# Patient Record
Sex: Male | Born: 1965
Health system: Southern US, Community
[De-identification: ages and names within clinical notes are randomized; demographics above are authoritative.]

## PROBLEM LIST (undated history)

## (undated) DIAGNOSIS — E785 Hyperlipidemia, unspecified: Secondary | ICD-10-CM

## (undated) DIAGNOSIS — M1990 Unspecified osteoarthritis, unspecified site: Secondary | ICD-10-CM

## (undated) DIAGNOSIS — I10 Essential (primary) hypertension: Secondary | ICD-10-CM

## (undated) DIAGNOSIS — K219 Gastro-esophageal reflux disease without esophagitis: Secondary | ICD-10-CM

## (undated) DIAGNOSIS — D1800 Hemangioma unspecified site: Secondary | ICD-10-CM

## (undated) DIAGNOSIS — Z8619 Personal history of other infectious and parasitic diseases: Secondary | ICD-10-CM

## (undated) HISTORY — PX: TONSILLECTOMY: SUR1361

## (undated) HISTORY — PX: COLONOSCOPY: SHX174

## (undated) HISTORY — PX: KNEE SURGERY: SHX244

## (undated) HISTORY — DX: Hyperlipidemia, unspecified: E78.5

## (undated) HISTORY — PX: MOUTH SURGERY: SHX715

## (undated) HISTORY — DX: Gastro-esophageal reflux disease without esophagitis: K21.9

## (undated) HISTORY — DX: Personal history of other infectious and parasitic diseases: Z86.19

## (undated) HISTORY — DX: Hemangioma unspecified site: D18.00

## (undated) HISTORY — PX: EPIDURAL BLOCK INJECTION: SHX1516

## (undated) HISTORY — DX: Unspecified osteoarthritis, unspecified site: M19.90

---

## 2000-09-04 ENCOUNTER — Ambulatory Visit (HOSPITAL_BASED_OUTPATIENT_CLINIC_OR_DEPARTMENT_OTHER): Admission: RE | Admit: 2000-09-04 | Discharge: 2000-09-04 | Payer: Self-pay | Admitting: Orthopaedic Surgery

## 2004-08-01 ENCOUNTER — Ambulatory Visit: Payer: Self-pay | Admitting: Internal Medicine

## 2004-09-13 ENCOUNTER — Ambulatory Visit: Payer: Self-pay | Admitting: Family Medicine

## 2005-04-25 ENCOUNTER — Ambulatory Visit: Payer: Self-pay | Admitting: Internal Medicine

## 2006-05-07 ENCOUNTER — Ambulatory Visit: Payer: Self-pay | Admitting: Internal Medicine

## 2006-05-23 ENCOUNTER — Ambulatory Visit: Payer: Self-pay | Admitting: Internal Medicine

## 2006-06-28 ENCOUNTER — Ambulatory Visit: Payer: Self-pay | Admitting: Internal Medicine

## 2006-06-28 LAB — CONVERTED CEMR LAB
ALT: 24 units/L (ref 0–40)
AST: 16 units/L (ref 0–37)
Chol/HDL Ratio, serum: 4.5
Cholesterol: 132 mg/dL (ref 0–200)
HDL: 29.5 mg/dL — ABNORMAL LOW (ref >39.0)
LDL Cholesterol: 81 mg/dL (ref 0–99)
Triglyceride fasting, serum: 109 mg/dL (ref 0–149)
VLDL: 22 mg/dL (ref 0–40)

## 2006-07-31 ENCOUNTER — Ambulatory Visit: Payer: Self-pay | Admitting: Internal Medicine

## 2006-08-02 ENCOUNTER — Ambulatory Visit: Payer: Self-pay | Admitting: Internal Medicine

## 2006-08-02 LAB — CONVERTED CEMR LAB
Rheumatoid Fact: <20 intl units/mL — ABNORMAL LOW (ref 0.0–20.0)
Sed Rate: 6 mm/hr (ref 0–20)
Total CK: 83 units/L (ref 7–195)
Uric Acid, Serum: 6.1 mg/dL (ref 2.4–7.0)

## 2006-08-27 ENCOUNTER — Ambulatory Visit: Payer: Self-pay | Admitting: Internal Medicine

## 2006-09-07 ENCOUNTER — Ambulatory Visit (HOSPITAL_COMMUNITY): Admission: RE | Admit: 2006-09-07 | Discharge: 2006-09-07 | Payer: Self-pay | Admitting: Orthopaedic Surgery

## 2006-09-07 ENCOUNTER — Encounter: Payer: Self-pay | Admitting: Vascular Surgery

## 2006-09-17 ENCOUNTER — Ambulatory Visit: Payer: Self-pay | Admitting: Internal Medicine

## 2006-11-28 ENCOUNTER — Ambulatory Visit: Payer: Self-pay | Admitting: Internal Medicine

## 2006-11-28 LAB — CONVERTED CEMR LAB
ALT: 21 units/L (ref 0–40)
AST: 16 units/L (ref 0–37)
Cholesterol: 115 mg/dL (ref 0–200)
HDL: 37.2 mg/dL — ABNORMAL LOW (ref >39.0)
LDL Cholesterol: 64 mg/dL (ref 0–99)
Total CHOL/HDL Ratio: 3.1
Triglycerides: 69 mg/dL (ref 0–149)
VLDL: 14 mg/dL (ref 0–40)

## 2006-12-11 ENCOUNTER — Encounter (INDEPENDENT_AMBULATORY_CARE_PROVIDER_SITE_OTHER): Payer: Self-pay | Admitting: *Deleted

## 2006-12-11 ENCOUNTER — Ambulatory Visit (HOSPITAL_BASED_OUTPATIENT_CLINIC_OR_DEPARTMENT_OTHER): Admission: RE | Admit: 2006-12-11 | Discharge: 2006-12-11 | Payer: Self-pay | Admitting: Orthopaedic Surgery

## 2007-01-14 ENCOUNTER — Ambulatory Visit: Payer: Self-pay | Admitting: Internal Medicine

## 2007-01-21 ENCOUNTER — Ambulatory Visit: Payer: Self-pay | Admitting: Internal Medicine

## 2007-01-21 DIAGNOSIS — M129 Arthropathy, unspecified: Secondary | ICD-10-CM | POA: Insufficient documentation

## 2007-01-22 LAB — CONVERTED CEMR LAB
Albumin: 4.2 g/dL (ref 3.5–5.2)
Basophils Absolute: 0 10*3/uL (ref 0.0–0.1)
Hemoglobin: 15 g/dL (ref 13.0–17.0)
Lymphocytes Relative: 32.2 % (ref 12.0–46.0)
MCHC: 33.5 g/dL (ref 30.0–36.0)
MCV: 85.6 fL (ref 78.0–100.0)
Monocytes Absolute: 0.6 10*3/uL (ref 0.2–0.7)
Monocytes Relative: 8 % (ref 3.0–11.0)
Neutro Abs: 4.2 10*3/uL (ref 1.4–7.7)
Rhuematoid fact SerPl-aCnc: <20 intl units/mL — ABNORMAL LOW (ref 0.0–20.0)
Total Bilirubin: 1 mg/dL (ref 0.3–1.2)
Total CK: 43 units/L (ref 7–195)
Uric Acid, Serum: 6.6 mg/dL (ref 2.4–7.0)

## 2007-01-23 ENCOUNTER — Telehealth: Payer: Self-pay | Admitting: Internal Medicine

## 2007-01-24 ENCOUNTER — Encounter: Payer: Self-pay | Admitting: Internal Medicine

## 2007-01-25 ENCOUNTER — Telehealth: Payer: Self-pay | Admitting: Internal Medicine

## 2007-01-26 LAB — CONVERTED CEMR LAB
Anti Nuclear Antibody(ANA): NEGATIVE
Hepatitis B Surface Ag: NEGATIVE

## 2007-01-30 ENCOUNTER — Encounter: Payer: Self-pay | Admitting: Internal Medicine

## 2007-03-14 ENCOUNTER — Ambulatory Visit: Payer: Self-pay | Admitting: Internal Medicine

## 2007-03-15 ENCOUNTER — Encounter: Payer: Self-pay | Admitting: Internal Medicine

## 2007-03-21 ENCOUNTER — Encounter: Admission: RE | Admit: 2007-03-21 | Discharge: 2007-03-21 | Payer: Self-pay | Admitting: Internal Medicine

## 2007-03-26 ENCOUNTER — Telehealth: Payer: Self-pay | Admitting: Internal Medicine

## 2007-03-28 ENCOUNTER — Telehealth: Payer: Self-pay | Admitting: Internal Medicine

## 2007-03-29 ENCOUNTER — Encounter (INDEPENDENT_AMBULATORY_CARE_PROVIDER_SITE_OTHER): Payer: Self-pay | Admitting: *Deleted

## 2007-05-23 ENCOUNTER — Ambulatory Visit: Payer: Self-pay | Admitting: Internal Medicine

## 2007-05-28 ENCOUNTER — Encounter: Payer: Self-pay | Admitting: Internal Medicine

## 2007-06-26 ENCOUNTER — Ambulatory Visit: Payer: Self-pay | Admitting: Internal Medicine

## 2007-07-25 ENCOUNTER — Encounter: Payer: Self-pay | Admitting: Internal Medicine

## 2007-08-05 ENCOUNTER — Telehealth (INDEPENDENT_AMBULATORY_CARE_PROVIDER_SITE_OTHER): Payer: Self-pay | Admitting: *Deleted

## 2007-10-04 ENCOUNTER — Ambulatory Visit: Payer: Self-pay | Admitting: Internal Medicine

## 2007-10-04 DIAGNOSIS — E785 Hyperlipidemia, unspecified: Secondary | ICD-10-CM | POA: Insufficient documentation

## 2007-12-20 ENCOUNTER — Encounter: Payer: Self-pay | Admitting: Internal Medicine

## 2008-04-24 ENCOUNTER — Encounter: Payer: Self-pay | Admitting: Internal Medicine

## 2008-05-29 ENCOUNTER — Ambulatory Visit: Payer: Self-pay | Admitting: Internal Medicine

## 2008-05-29 HISTORY — DX: Other disorders of bilirubin metabolism: E80.6

## 2008-06-01 ENCOUNTER — Telehealth (INDEPENDENT_AMBULATORY_CARE_PROVIDER_SITE_OTHER): Payer: Self-pay | Admitting: *Deleted

## 2008-06-05 ENCOUNTER — Encounter (INDEPENDENT_AMBULATORY_CARE_PROVIDER_SITE_OTHER): Payer: Self-pay | Admitting: *Deleted

## 2008-06-05 ENCOUNTER — Ambulatory Visit: Payer: Self-pay | Admitting: Internal Medicine

## 2008-06-05 LAB — CONVERTED CEMR LAB
OCCULT 2: NEGATIVE
OCCULT 3: NEGATIVE

## 2008-06-08 ENCOUNTER — Ambulatory Visit: Payer: Self-pay | Admitting: Internal Medicine

## 2008-06-09 ENCOUNTER — Encounter (INDEPENDENT_AMBULATORY_CARE_PROVIDER_SITE_OTHER): Payer: Self-pay | Admitting: *Deleted

## 2008-06-09 LAB — CONVERTED CEMR LAB
Albumin: 4.1 g/dL (ref 3.5–5.2)
Total Protein: 6.6 g/dL (ref 6.0–8.3)

## 2008-09-25 ENCOUNTER — Encounter: Payer: Self-pay | Admitting: Internal Medicine

## 2009-02-26 ENCOUNTER — Encounter: Payer: Self-pay | Admitting: Internal Medicine

## 2009-06-14 ENCOUNTER — Ambulatory Visit: Payer: Self-pay | Admitting: Internal Medicine

## 2009-06-18 ENCOUNTER — Encounter (INDEPENDENT_AMBULATORY_CARE_PROVIDER_SITE_OTHER): Payer: Self-pay | Admitting: *Deleted

## 2009-07-23 ENCOUNTER — Encounter: Payer: Self-pay | Admitting: Internal Medicine

## 2009-08-04 ENCOUNTER — Telehealth: Payer: Self-pay | Admitting: Internal Medicine

## 2009-08-10 ENCOUNTER — Ambulatory Visit: Payer: Self-pay | Admitting: Internal Medicine

## 2009-08-10 DIAGNOSIS — R519 Headache, unspecified: Secondary | ICD-10-CM | POA: Insufficient documentation

## 2009-08-10 DIAGNOSIS — R51 Headache: Secondary | ICD-10-CM | POA: Insufficient documentation

## 2009-08-25 ENCOUNTER — Telehealth (INDEPENDENT_AMBULATORY_CARE_PROVIDER_SITE_OTHER): Payer: Self-pay | Admitting: *Deleted

## 2009-09-07 ENCOUNTER — Ambulatory Visit: Payer: Self-pay | Admitting: Internal Medicine

## 2009-09-07 DIAGNOSIS — T887XXA Unspecified adverse effect of drug or medicament, initial encounter: Secondary | ICD-10-CM | POA: Insufficient documentation

## 2009-09-10 LAB — CONVERTED CEMR LAB
AST: 26 units/L (ref 0–37)
Albumin: 4.3 g/dL (ref 3.5–5.2)
Alkaline Phosphatase: 46 units/L (ref 39–117)
BUN: 11 mg/dL (ref 6–23)
Basophils Absolute: 0 10*3/uL (ref 0.0–0.1)
Basophils Relative: 0.7 % (ref 0.0–3.0)
Bilirubin, Direct: 0.1 mg/dL (ref 0.0–0.3)
CO2: 29 meq/L (ref 19–32)
Calcium: 9.1 mg/dL (ref 8.4–10.5)
Chloride: 110 meq/L (ref 96–112)
Creatinine, Ser: 1 mg/dL (ref 0.4–1.5)
Eosinophils Absolute: 0.1 10*3/uL (ref 0.0–0.7)
Glucose, Bld: 93 mg/dL (ref 70–99)
LDL Cholesterol: 84 mg/dL (ref 0–99)
Lymphocytes Relative: 36.4 % (ref 12.0–46.0)
MCHC: 33.8 g/dL (ref 30.0–36.0)
MCV: 98.3 fL (ref 78.0–100.0)
Monocytes Absolute: 0.3 10*3/uL (ref 0.1–1.0)
Neutrophils Relative %: 54.8 % (ref 43.0–77.0)
Platelets: 175 10*3/uL (ref 150.0–400.0)
RBC: 4.57 M/uL (ref 4.22–5.81)
RDW: 14 % (ref 11.5–14.6)
Total Bilirubin: 1 mg/dL (ref 0.3–1.2)
Total CHOL/HDL Ratio: 4
Triglycerides: 101 mg/dL (ref 0.0–149.0)

## 2009-09-21 ENCOUNTER — Telehealth (INDEPENDENT_AMBULATORY_CARE_PROVIDER_SITE_OTHER): Payer: Self-pay | Admitting: *Deleted

## 2009-09-24 ENCOUNTER — Encounter: Payer: Self-pay | Admitting: Internal Medicine

## 2009-10-13 ENCOUNTER — Encounter: Payer: Self-pay | Admitting: Internal Medicine

## 2009-10-14 ENCOUNTER — Telehealth: Payer: Self-pay | Admitting: Internal Medicine

## 2009-12-16 ENCOUNTER — Encounter: Payer: Self-pay | Admitting: Internal Medicine

## 2010-01-05 ENCOUNTER — Telehealth (INDEPENDENT_AMBULATORY_CARE_PROVIDER_SITE_OTHER): Payer: Self-pay | Admitting: *Deleted

## 2010-02-09 ENCOUNTER — Ambulatory Visit: Payer: Self-pay | Admitting: Family

## 2010-02-09 ENCOUNTER — Ambulatory Visit: Payer: Self-pay | Admitting: Interventional Radiology

## 2010-02-09 ENCOUNTER — Encounter: Payer: Self-pay | Admitting: Internal Medicine

## 2010-02-09 ENCOUNTER — Telehealth: Payer: Self-pay | Admitting: Family

## 2010-02-09 ENCOUNTER — Ambulatory Visit (HOSPITAL_BASED_OUTPATIENT_CLINIC_OR_DEPARTMENT_OTHER): Admission: RE | Admit: 2010-02-09 | Discharge: 2010-02-09 | Payer: Self-pay | Admitting: Internal Medicine

## 2010-02-09 DIAGNOSIS — M069 Rheumatoid arthritis, unspecified: Secondary | ICD-10-CM

## 2010-02-09 HISTORY — DX: Rheumatoid arthritis, unspecified: M06.9

## 2010-02-11 LAB — CONVERTED CEMR LAB
ALT: 27 units/L (ref 0–53)
AST: 19 units/L (ref 0–37)
Basophils Absolute: 0 10*3/uL (ref 0.0–0.1)
Basophils Relative: 1 % (ref 0–1)
Bilirubin, Direct: 0.2 mg/dL (ref 0.0–0.3)
Cholesterol: 158 mg/dL (ref 0–200)
Hemoglobin: 16.2 g/dL (ref 13.0–17.0)
Indirect Bilirubin: 0.8 mg/dL (ref 0.0–0.9)
Lymphocytes Relative: 33 % (ref 12–46)
MCHC: 34.2 g/dL (ref 30.0–36.0)
Monocytes Relative: 9 % (ref 3–12)
Neutro Abs: 3.7 10*3/uL (ref 1.7–7.7)
Neutrophils Relative %: 56 % (ref 43–77)
RBC: 4.93 M/uL (ref 4.22–5.81)
RDW: 14.8 % (ref 11.5–15.5)
Total Protein: 6.2 g/dL (ref 6.0–8.3)
Triglycerides: 131 mg/dL (ref <150)

## 2010-02-23 ENCOUNTER — Ambulatory Visit: Payer: Self-pay | Admitting: Family

## 2010-04-14 ENCOUNTER — Ambulatory Visit: Payer: Self-pay | Admitting: Internal Medicine

## 2010-08-18 ENCOUNTER — Encounter: Payer: Self-pay | Admitting: Internal Medicine

## 2010-08-18 ENCOUNTER — Other Ambulatory Visit: Payer: Self-pay | Admitting: Internal Medicine

## 2010-08-18 ENCOUNTER — Ambulatory Visit
Admission: RE | Admit: 2010-08-18 | Discharge: 2010-08-18 | Payer: Self-pay | Source: Home / Self Care | Attending: Internal Medicine | Admitting: Internal Medicine

## 2010-08-18 DIAGNOSIS — I10 Essential (primary) hypertension: Secondary | ICD-10-CM | POA: Insufficient documentation

## 2010-08-18 HISTORY — DX: Essential (primary) hypertension: I10

## 2010-08-18 LAB — CBC WITH DIFFERENTIAL/PLATELET
Basophils Absolute: 0 10*3/uL (ref 0.0–0.1)
Basophils Relative: 0.6 % (ref 0.0–3.0)
Eosinophils Absolute: 0.2 10*3/uL (ref 0.0–0.7)
Eosinophils Relative: 2.8 % (ref 0.0–5.0)
HCT: 49 % (ref 39.0–52.0)
Hemoglobin: 16.7 g/dL (ref 13.0–17.0)
Lymphocytes Relative: 35.8 % (ref 12.0–46.0)
Lymphs Abs: 2.3 10*3/uL (ref 0.7–4.0)
MCHC: 34.1 g/dL (ref 30.0–36.0)
MCV: 97.4 fl (ref 78.0–100.0)
Monocytes Absolute: 0.5 10*3/uL (ref 0.1–1.0)
Monocytes Relative: 7.7 % (ref 3.0–12.0)
Neutro Abs: 3.4 10*3/uL (ref 1.4–7.7)
Neutrophils Relative %: 53.1 % (ref 43.0–77.0)
Platelets: 205 10*3/uL (ref 150.0–400.0)
RBC: 5.03 Mil/uL (ref 4.22–5.81)
RDW: 15.2 % — ABNORMAL HIGH (ref 11.5–14.6)
WBC: 6.4 10*3/uL (ref 4.5–10.5)

## 2010-08-19 LAB — TSH: TSH: 1.08 u[IU]/mL (ref 0.35–5.50)

## 2010-08-19 LAB — HEPATIC FUNCTION PANEL
ALT: 42 U/L (ref 0–53)
AST: 28 U/L (ref 0–37)
Albumin: 4 g/dL (ref 3.5–5.2)
Alkaline Phosphatase: 46 U/L (ref 39–117)
Bilirubin, Direct: 0.2 mg/dL (ref 0.0–0.3)
Total Bilirubin: 1.2 mg/dL (ref 0.3–1.2)
Total Protein: 6.4 g/dL (ref 6.0–8.3)

## 2010-08-19 LAB — BASIC METABOLIC PANEL
BUN: 14 mg/dL (ref 6–23)
CO2: 27 mEq/L (ref 19–32)
Calcium: 9 mg/dL (ref 8.4–10.5)
Chloride: 106 mEq/L (ref 96–112)
Creatinine, Ser: 0.8 mg/dL (ref 0.4–1.5)
GFR: 107.38 mL/min (ref >60.00)
Glucose, Bld: 77 mg/dL (ref 70–99)
Potassium: 4.2 mEq/L (ref 3.5–5.1)
Sodium: 142 mEq/L (ref 135–145)

## 2010-09-11 LAB — CONVERTED CEMR LAB
ALT: 48 units/L (ref 0–53)
Albumin: 4.4 g/dL (ref 3.5–5.2)
Albumin: 4.5 g/dL (ref 3.5–5.2)
Alkaline Phosphatase: 46 units/L (ref 39–117)
Basophils Absolute: 0 10*3/uL (ref 0.0–0.1)
Basophils Relative: 0.2 % (ref 0.0–3.0)
Basophils Relative: 0.5 % (ref 0.0–3.0)
Bilirubin, Direct: 0.2 mg/dL (ref 0.0–0.3)
CO2: 30 meq/L (ref 19–32)
Calcium: 8.5 mg/dL (ref 8.4–10.5)
Calcium: 9.4 mg/dL (ref 8.4–10.5)
Chloride: 106 meq/L (ref 96–112)
Chloride: 108 meq/L (ref 96–112)
Cholesterol: 128 mg/dL (ref 0–200)
Creatinine, Ser: 0.9 mg/dL (ref 0.4–1.5)
Eosinophils Absolute: 0.1 10*3/uL (ref 0.0–0.7)
Eosinophils Absolute: 0.1 10*3/uL (ref 0.0–0.7)
Eosinophils Relative: 2.4 % (ref 0.0–5.0)
GFR calc Af Amer: 118 mL/min
GFR calc non Af Amer: 97 mL/min
HCT: 49.4 % (ref 39.0–52.0)
HDL goal, serum: 40 mg/dL
Hemoglobin: 16.1 g/dL (ref 13.0–17.0)
Lymphocytes Relative: 40.5 % (ref 12.0–46.0)
MCHC: 34.6 g/dL (ref 30.0–36.0)
MCHC: 35.4 g/dL (ref 30.0–36.0)
MCV: 95 fL (ref 78.0–100.0)
MCV: 96.6 fL (ref 78.0–100.0)
Monocytes Absolute: 0.3 10*3/uL (ref 0.1–1.0)
Neutro Abs: 2.6 10*3/uL (ref 1.4–7.7)
Neutro Abs: 3.1 10*3/uL (ref 1.4–7.7)
Neutrophils Relative %: 50.6 % (ref 43.0–77.0)
Neutrophils Relative %: 55.6 % (ref 43.0–77.0)
RBC: 4.7 M/uL (ref 4.22–5.81)
RBC: 5.2 M/uL (ref 4.22–5.81)
Sodium: 142 meq/L (ref 135–145)
TSH: 0.81 microintl units/mL (ref 0.35–5.50)
Total Bilirubin: 2.2 mg/dL — ABNORMAL HIGH (ref 0.3–1.2)
Total CK: 87 units/L (ref 7–195)
Total Protein: 6.6 g/dL (ref 6.0–8.3)
VLDL: 19 mg/dL (ref 0–40)
WBC: 5 10*3/uL (ref 4.5–10.5)

## 2010-09-13 NOTE — Letter (Signed)
Summary: Stacey Drain MD Rheumatology  Stacey Drain MD Rheumatology   Imported By: Lanelle Bal 12/27/2009 13:59:51  _____________________________________________________________________  External Attachment:    Type:   Image     Comment:   External Document

## 2010-09-13 NOTE — Progress Notes (Signed)
Summary: Request for appointment  Phone Note Call from Patient Call back at [phone]   Caller: Patient Summary of Call: Message left on Vm: Started Crestor 10weeks ago needs to schedule appointment for labs and also needs appointment for B/P follow-up.  Enrique Sack please contact patient and schedule an appointment next week-early morning for a B/P follow-up, when we finish seeing patient we will send him to the lab to F/U on Crestor. Thanks-  **INFORM PATIENT TO COME FASTING**   Chrae Malloy  August 25, 2009 5:08 PM       Additional Follow-up for Phone Call Additional follow up Details #2::    left message to return call to [place].Marland KitchenMarland KitchenMarland KitchenBarb Merino  August 26, 2009 8:56 AM    patient has an appt on Jan 18,2011.Marland KitchenMarland KitchenMarland KitchenBarb Merino  August 26, 2009 9:11 AM   Follow-up by: Barb Merino,  August 26, 2009 8:56 AM

## 2010-09-13 NOTE — Assessment & Plan Note (Signed)
Summary: fu on bp /kdc/pt rescd//ccm   Vital Signs:  Patient profile:   45 year old male Weight:      222.0 pounds BMI:     31.07 Temp:     98.1 degrees F oral Pulse rate:   60 / minute Resp:     15 per minute BP sitting:   122 / 80  (left arm) Cuff size:   large  Vitals Entered By: Shonna Chock (September 07, 2009 9:34 AM) CC: B/P follow-up , compare cuffs: 121/81 P:55, Headache, Hypertension Management Comments REVIEWED MED LIST, PATIENT AGREED DOSE AND INSTRUCTION CORRECT    CC:  B/P follow-up , compare cuffs: 121/81 P:55, Headache, and Hypertension Management.  History of Present Illness: BP range 121/81- 171/109(08/10/2009), prior high 153/103 (08/15/2009).Marked drop after CVE to 124/80s.No average kept.Cuff checked ; excellent correlation with office cuff.  Hypertension History:      He complains of headache, but denies chest pain, palpitations, dyspnea with exertion, orthopnea, PND, peripheral edema, visual symptoms, neurologic problems, syncope, and side effects from treatment.  He notes no problems with any antihypertensive medication side effects.  Dust exposure related headaches.        Positive major cardiovascular risk factors include male age 71 years old or older, hyperlipidemia, and family history for ischemic heart disease (males less than 72 years old).  Negative major cardiovascular risk factors include no history of diabetes, no history of hypertension, and non-tobacco-user status.        Further assessment for target organ damage reveals no history of ASHD, stroke/TIA, or peripheral vascular disease.      Allergies: No Known Drug Allergies  Review of Systems Eyes:  Denies blurring, double vision, and vision loss-both eyes. CV:  Denies leg cramps with exertion, lightheadness, and near fainting. Neuro:  Denies numbness, tingling, and weakness.  Physical Exam  General:  well-nourished,in no acute distress; alert,appropriate and cooperative throughout  examination Lungs:  Normal respiratory effort, chest expands symmetrically. Lungs are clear to auscultation, no crackles or wheezes. Heart:  Normal rate and regular rhythm. S1 and S2 normal without gallop, murmur, click, rub. S4 Abdomen:  Bowel sounds positive,abdomen soft and non-tender without masses, organomegaly or hernias noted. No AAA or bruits Pulses:  R and L carotid,radial,dorsalis pedis and posterior tibial pulses are full and equal bilaterally Extremities:  No clubbing, cyanosis, edema.   Impression & Recommendations:  Problem # 1:  ELEVATED BLOOD PRESSURE WITHOUT DIAGNOSIS OF HYPERTENSION (ICD-796.2)  His updated medication list for this problem includes:    Metoprolol Tartrate 25 Mg Tabs (Metoprolol tartrate) .Marland Kitchen... 1 two times a day  Orders: Venipuncture (16109) TLB-BMP (Basic Metabolic Panel-BMET) (80048-METABOL)  Problem # 2:  HYPERLIPIDEMIA (ICD-272.4)  His updated medication list for this problem includes:    Crestor 20 Mg Tabs (Rosuvastatin calcium) .Marland Kitchen... Daily as directed  Orders: Venipuncture (60454) TLB-Lipid Panel (80061-LIPID)  Problem # 3:  UNS ADVRS EFF UNS RX MEDICINAL&BIOLOGICAL SBSTNC (ICD-995.20) On Crestor & MTX Orders: Venipuncture (09811) TLB-Hepatic/Liver Function Pnl (80076-HEPATIC) TLB-CBC Platelet - w/Differential (85025-CBCD)  Complete Medication List: 1)  Methotrexate 2.5 Mg Tabs (Methotrexate sodium) .... Takes ten tabs one day a week 2)  Tylenolprn  3)  Crestor 20 Mg Tabs (Rosuvastatin calcium) .... Daily as directed 4)  Metoprolol Tartrate 25 Mg Tabs (Metoprolol tartrate) .Marland Kitchen.. 1 two times a day  Hypertension Assessment/Plan:      The patient's hypertensive risk group is category B: At least one risk factor (excluding diabetes) with no target organ damage.  His calculated 10 year risk of coronary heart disease is 4 %.  Today's blood pressure is 122/80.    Patient Instructions: 1)  Check your Blood Pressure regularly. Your goal =  AVERAGE < 135/85

## 2010-09-13 NOTE — Letter (Signed)
Summary: [facility]  [facility]   Imported By: Lanelle Bal 10/06/2009 09:04:35  _____________________________________________________________________  External Attachment:    Type:   Image     Comment:   External Document

## 2010-09-13 NOTE — Letter (Signed)
Summary: [facility]  [facility]   Imported By: Lanelle Bal 11/01/2009 12:13:49  _____________________________________________________________________  External Attachment:    Type:   Image     Comment:   External Document

## 2010-09-13 NOTE — Assessment & Plan Note (Signed)
Summary: UPPER STOMACH PAINS//KN--Rm 4   Vital Signs:  Patient profile:   45 year old male Height:      71 inches Weight:      227.75 pounds BMI:     31.88 Temp:     97.7 degrees F oral Pulse rate:   60 / minute Pulse rhythm:   regular Resp:     16 per minute BP sitting:   120 / 94  (right arm) Cuff size:   large  Vitals Entered By: Mervin Kung CMA (February 09, 2010 8:37 AM) CC: Room 4  Pt states he has had upper abdominal pain, bloating and increase gas x 1 week.  Also is having trouble staying asleep x 3-4 months. Is Patient Diabetic? No Comments Pt states he is taking Methotrexate 7 tabs one day a week; he takes Crestor once a week.   CC:  Room 4  Pt states he has had upper abdominal pain and bloating and increase gas x 1 week.  Also is having trouble staying asleep x 3-4 months..  History of Present Illness: Robert Velazquez is a 45 year old male who presents with complaint of epigastric soreness.   Seems to hurt more after meals.  Appetite is "too good." Has felt bloated and "gasy" for approximately one week.  Has had several episodes of nausea- but did not vomit.  BM's notes that he has had some constipation, but then this will be followed by multiple soft stools.  Denies BRBPR or melena.     Also notes difficulty getting back to sleep at night- wakes up most nights.  Has tried melatonin which has been helping him.    Allergies (verified): No Known Drug Allergies  Physical Exam  General:  Well-developed,well-nourished,in no acute distress; alert,appropriate and cooperative throughout examination Lungs:  Normal respiratory effort, chest expands symmetrically. Lungs are clear to auscultation, no crackles or wheezes. Heart:  Normal rate and regular rhythm. S1 and S2 normal without gallop, murmur, click, rub or other extra sounds. Abdomen:  mild epigastric discomfort without guarding.  + bowel sounds.  Abdomen is soft.   Impression & Recommendations:  Problem # 1:  EPIGASTRIC  PAIN (ICD-789.06) May be acute gastroenteritis.  Will add empiric PPI.  Abdominal US noted non obstructing gall stone. Check labs as below.  Patient to follow up in 2 weeks.  If no improvement at that time will likely need GI referral.   Orders: TLB-Hepatic/Liver Function Pnl (80076-HEPATIC) TLB-CBC Platelet - w/Differential (85025-CBCD) TLB-Amylase (82150-AMYL) T-Lipase (16109-60454) Misc. Referral (Misc. Ref)  Complete Medication List: 1)  Methotrexate 2.5 Mg Tabs (Methotrexate sodium) .... Takes ten tabs one day a week 2)  Tylenol  .... As needed 3)  Crestor 20 Mg Tabs (Rosuvastatin calcium) .... Daily as directed 4)  Metoprolol Tartrate 25 Mg Tabs (Metoprolol tartrate) .Marland Kitchen.. 1 two times a day 5)  Prilosec Otc 20 Mg Tbec (Omeprazole magnesium) .... One tablet by mouth daily  Other Orders: TLB-Lipid Panel (80061-LIPID)  Patient Instructions: 1)  Try to eliminate caffeine and see if this helps with your sleep pattern. 2)  Please complete your blood work this morning. 3)  Please return this afternoon for your ultrasound- we will call with the time. 4)  Follow up in 2 weeks, sooner if symptoms worsen or do not improve.   Current Allergies (reviewed today): No known allergies

## 2010-09-13 NOTE — Progress Notes (Signed)
Summary: Refill Request  Phone Note Refill Request Call back at [phone] Message from:  Patient  Refills Requested: Medication #1:  CRESTOR 20 MG TABS daily as directed [employer] [phone] option 1   Method Requested: Telephone to Pharmacy Initial call taken by: Shonna Chock,  Jan 05, 2010 11:53 AM    Prescriptions: CRESTOR 20 MG TABS (ROSUVASTATIN CALCIUM) daily as directed  #90 x 3   Entered by:   Shonna Chock   Authorized by:   Marga Melnick MD   Signed by:   Shonna Chock on 01/05/2010   Method used:   Faxed to ...       [address] [facility] (mail-order)       [address]       [place], [place]  [address]       Ph: [phone]       Fax: [phone]   RxID:   [id number]

## 2010-09-13 NOTE — Progress Notes (Signed)
Summary: effect of med  Phone Note Call from Patient Call back at [phone]   Caller: Patient Summary of Call: Pt states that he recently start drinking a Pomegranate/blueberry juice mixture and wants to make sure that this will not have any effect of current med. pt states that he read some where that this will help lower cholesterol and BP. Pt was started on prednisone in addition to current med on yesterday. pls advise on effect  if any.........................Marland KitchenFelecia Deloach CMA  October 14, 2009 11:55 AM   Follow-up for Phone Call        Grapefruit juice interferes with metabolization of  cholesterol meds. These are good natural  supplements  but should not be taken within 4 hrs of cholesterol meds to be cautious Follow-up by: Marga Melnick MD,  October 14, 2009 6:00 PM  Additional Follow-up for Phone Call Additional follow up Details #1::        pt aware............Marland KitchenFelecia Deloach CMA  October 15, 2009 8:29 AM

## 2010-09-13 NOTE — Progress Notes (Signed)
Summary: U/s result  Phone Note Outgoing Call   Summary of Call: left message for patient to return call.  Ultrasound shows one gallstone- not obstructing.  Doubt that is cause for his pain.  He should start the omeprazole as we discussed.  I will review labs when the become available. Initial call taken by: Lemont Fillers FNP,  February 09, 2010 10:26 AM  Follow-up for Phone Call        Notified pt. of Robert Velazquez's instructions. Scheduled pt f/u for 02/23/10 @ 8:30am.  Mervin Kung CMA  February 09, 2010 1:25 PM

## 2010-09-13 NOTE — Assessment & Plan Note (Signed)
Summary: 2 week f/u / tf,cma--Rm 4   Vital Signs:  Patient profile:   45 year old male Height:      71 inches Weight:      225.50 pounds BMI:     31.56 Temp:     97.9 degrees F oral Pulse rate:   60 / minute Pulse rhythm:   regular Resp:     12 per minute BP sitting:   136 / 80  (right arm) Cuff size:   large  Vitals Entered By: Mervin Kung CMA Duncan Dull) (February 23, 2010 8:50 AM) CC: Room 4  2 week follow up. Is Patient Diabetic? No   CC:  Room 4  2 week follow up.Marland Kitchen  History of Present Illness: Mr.  Doane is a 45 year old male who presents today for follow.  Last visit he complained of epigastric discomfort and mild nausea.  He underwent an abdominal US (noted a non-obstructing gall stone) and laboratories which were unremarkable. He was started on Prilosec OTC.  Since starting prilosec he notes resolution of his heartburn symptoms.  Also denies nausea, vomitting,  or diarrhea.  Denies symptoms of GERD.  Now he is trying to lose weight- back in weight watchers,  Allergies (verified): No Known Drug Allergies  Physical Exam  General:  Well-developed,well-nourished,in no acute distress; alert,appropriate and cooperative throughout examination Head:  Normocephalic and atraumatic without obvious abnormalities. No apparent alopecia or balding. Abdomen:  Bowel sounds positive,abdomen soft and non-tender without masses, organomegaly or hernias noted.   Impression & Recommendations:  Problem # 1:  EPIGASTRIC PAIN (ICD-789.06) Assessment Improved Suspect symptoms due to GERD.  Now resolved.  Continue OTC prilosec.    Complete Medication List: 1)  Methotrexate 2.5 Mg Tabs (Methotrexate sodium) .... Takes ten tabs one day a week 2)  Tylenol  .... As needed 3)  Crestor 20 Mg Tabs (Rosuvastatin calcium) .... Daily as directed 4)  Metoprolol Tartrate 25 Mg Tabs (Metoprolol tartrate) .Marland Kitchen.. 1 two times a day 5)  Prilosec Otc 20 Mg Tbec (Omeprazole magnesium) .... One tablet by mouth  daily  Patient Instructions: 1)  Please follow up with Dr. Alwyn Ren in 3 months.  Current Allergies (reviewed today): No known allergies

## 2010-09-13 NOTE — Assessment & Plan Note (Signed)
Summary: cough/cbs   Vital Signs:  Patient profile:   45 year old male Weight:      223.4 pounds BMI:     31.27 Temp:     98.8 degrees F oral Pulse rate:   80 / minute Resp:     15 per minute BP sitting:   122 / 80  (left arm) Cuff size:   large  Vitals Entered By: Shonna Chock CMA (April 14, 2010 2:48 PM) CC: Cough since Tuesday   CC:  Cough since Tuesday.  History of Present Illness: Cough      This is a 45 year old man who presents with Cough since 04/12/2010.  The patient reports productive cough with semiopaque sputum,  minimal wheezing, and low grade  fever. He  denies pleuritic chest pain, shortness of breath, exertional dyspnea, hemoptysis, and  severe malaise.  Associated symtpoms include cold/URI symptoms with frontal headache  & facial pain w/o purulence . Mild  sore throat only related to the cough.  Partially effective prior treatments have included OTC cough medication, Nyquil & Mucinex DM.    Current Medications (verified): 1)  Methotrexate 2.5 Mg  Tabs (Methotrexate Sodium) .... Takes Ten Tabs One Day A Week 2)  Tylenol .... As Needed 3)  Crestor 20 Mg Tabs (Rosuvastatin Calcium) .... Daily As Directed 4)  Metoprolol Tartrate 25 Mg Tabs (Metoprolol Tartrate) .Marland Kitchen.. 1 Two Times A Day 5)  Prilosec Otc 20 Mg Tbec (Omeprazole Magnesium) .... One Tablet By Mouth Daily  Allergies (verified): No Known Drug Allergies  Physical Exam  General:  well-nourished,in no acute distress; alert,appropriate and cooperative throughout examination Ears:  External ear exam shows no significant lesions or deformities.  Otoscopic examination reveals clear canals, tympanic membranes are intact bilaterally without bulging, retraction, inflammation or discharge. Hearing is grossly normal bilaterally. Nose:  External nasal examination shows no deformity or inflammation. Nasal mucosa are pink and moist without lesions or exudates. Mouth:  Oral mucosa and oropharynx without lesions or  exudates.  Teeth in good repair. Lungs:  Normal respiratory effort, chest expands symmetrically. Lungs are clear to auscultation, no crackles or wheezes. Heart:  Normal rate and regular rhythm. S1 and S2 normal without gallop, murmur, click, rub.S4 Cervical Nodes:  No lymphadenopathy noted Axillary Nodes:  No palpable lymphadenopathy   Impression & Recommendations:  Problem # 1:  BRONCHITIS-ACUTE (ICD-466.0)  His updated medication list for this problem includes:    Azithromycin 250 Mg Tabs (Azithromycin) .Marland Kitchen... As per pack  Problem # 2:  URI (ICD-465.9)  Complete Medication List: 1)  Methotrexate 2.5 Mg Tabs (Methotrexate sodium) .... Takes ten tabs one day a week 2)  Tylenol  .... As needed 3)  Crestor 20 Mg Tabs (Rosuvastatin calcium) .... Daily as directed 4)  Metoprolol Tartrate 25 Mg Tabs (Metoprolol tartrate) .Marland Kitchen.. 1 two times a day 5)  Prilosec Otc 20 Mg Tbec (Omeprazole magnesium) .... One tablet by mouth daily 6)  Azithromycin 250 Mg Tabs (Azithromycin) .... As per pack  Patient Instructions: 1)  Neti pot once daily as needed for congestion. 2)  Drink as much fluid as you can tolerate for the next few days. Prescriptions: AZITHROMYCIN 250 MG TABS (AZITHROMYCIN) as per pack  #1 x 0   Entered and Authorized by:   Marga Melnick MD   Signed by:   Marga Melnick MD on 04/14/2010   Method used:   Faxed to ...       [facility]  [address]       [place], [place]  [address]       Ph: [phone]       Fax: [phone]   RxID:   [id number]

## 2010-09-13 NOTE — Progress Notes (Signed)
Summary: Medication concerns  Phone Note Call from Patient Call back at Home Phone [phone]   Caller: Patient Summary of Call: Spoke with patient, patient would like RX called in to mail order company [facility]) [phone]   I called rx for Metoprolol in left message on secure voicemail #[phone]/ 1refill/Chrae [facility]  September 21, 2009 3:34 PM

## 2010-09-15 NOTE — Assessment & Plan Note (Signed)
Summary: cpx pt fasting/nta   Vital Signs:  Patient profile:   45 year old male Height:      71 inches Weight:      227 pounds BMI:     31.77 Temp:     97.9 degrees F oral Pulse rate:   66 / minute Resp:     16 per minute BP sitting:   149 / 100  (left arm) Cuff size:   large  Vitals Entered By: Shonna Chock CMA (August 18, 2010 10:02 AM) CC: CPX with fasting labs , Heartburn, Lipid Management Comments Recheck on B/P: 138/100 left arm  Patient takes B/P meds off/on (forgets to take)    CC:  CPX with fasting labs , Heartburn, and Lipid Management.  History of Present Illness:    Robert Velazquez is here is  for a physical; he is asymptomatic.    Hypertension Follow-Up:The patient denies lightheadedness, urinary frequency, headaches, edema, and fatigue.  The patient denies the following associated symptoms: chest pain, chest pressure, exercise intolerance, dyspnea, palpitations, and syncope.  Compliance with medications (by patient report) has been sporadic, "I forget it".  The patient reports that dietary compliance has been fair.  The patient reports exercising 3 X per week.  Adjunctive measures currently used by the patient include salt restriction.      Hyperlipidemia Follow-Up:  The patient denies muscle aches, GI upset, abdominal pain, flushing, itching, constipation, and diarrhea.  Compliance with medications (by patient report) has been near 100%; "it's only once/ week".       GERD:  The patient reports acid reflux and weight gain of 7#, but denies sour taste in mouth, epigastric pain, and trouble swallowing.  The patient denies the following alarm features: melena, dysphagia, hematemesis, and vomiting.  Symptoms are worse with sodas.  No diagnostic studies to date.  The patient has found the following treatments to be effective: a PPI, Omeprazole OTC as needed .    Lipid Management History:      Positive NCEP/ATP III risk factors include male age 34 years old or older, HDL cholesterol  less than 40, family history for ischemic heart disease (males less than 50 years old), and hypertension.  Negative NCEP/ATP III risk factors include non-diabetic, non-tobacco-user status, no ASHD (atherosclerotic heart disease), no prior stroke/TIA, no peripheral vascular disease, and no history of aortic aneurysm.     Allergies (verified): No Known Drug Allergies  Past History:  Past Medical History: Hyperlipidemia:LDL 140(1854 total / 1324 small dense particles), TG 102, HDL 33; LDL goal = < 100, ideally < 75.[place] Study LDL goal = < 130. Rheumatoid Arthritis Variant, Dr Kellie Simmering; Gilbert's Syndrome  Past Surgical History: Arthroscopic R knee surgery x 2 ; Oral surgery (wisdom teeth extraction & tooth removed  from palate & re-implanted; Tonsillectomy; DDD (bulging discs) S/P epidural injection X 3, Dr Alveda Reasons  Family History: PGM : ? RA; Father:MI d 32; P uncle: MI @ 68; PGF: MI after 1; Mother: arthritis Siblings: negative  Social History: Married 2 kids Occupation: Engineer, materials Never Smoked Alcohol use-no  Review of Systems  The patient denies anorexia, fever, vision loss, decreased hearing, hoarseness, prolonged cough, hemoptysis, hematuria, suspicious skin lesions, depression, abnormal bleeding, enlarged lymph nodes, and angioedema.    Physical Exam  General:  well-nourished;alert,appropriate and cooperative throughout examination Head:  Normocephalic and atraumatic without obvious abnormalities. Pattern  alopecia  Eyes:  No corneal or conjunctival inflammation noted. Perrla. Funduscopic exam benign, without hemorrhages, exudates or papilledema.  Ears:  External ear exam shows no significant lesions or deformities.  Otoscopic examination reveals  wax R > L. Hearing is grossly normal bilaterally. Nose:  External nasal examination shows no deformity or inflammation. Nasal mucosa are pink and moist without lesions or exudates. Mouth:  Oral mucosa and oropharynx  without lesions or exudates.  Teeth in good repair. Neck:  No deformities, masses, or tenderness noted. Lungs:  Normal respiratory effort, chest expands symmetrically. Lungs are clear to auscultation, no crackles or wheezes. Heart:  regular rhythm, no murmur, no gallop, no rub, no JVD, no HJR, and bradycardia.   Abdomen:  Bowel sounds positive,abdomen soft and non-tender without masses, organomegaly or hernias noted. Rectal:  No external abnormalities noted. Normal sphincter tone. No rectal masses or tenderness. Genitalia:  Testes bilaterally descended without nodularity, tenderness or masses.R  scrotal epididymal  mass. No penis lesions or urethral discharge. L varicocele.   Prostate:  Prostate gland firm and smooth, no enlargement, nodularity, tenderness, mass, asymmetry or induration. Msk:  No deformity or scoliosis noted of thoracic or lumbar spine.   Pulses:  R and L carotid,radia,dorsalis pedis and posterior tibial pulses are full and equal bilaterally Extremities:  No clubbing, cyanosis, edema, or deformity noted with normal full range of motion of all joints. Subcutaneous  soft vtissue deformity L index finger  Neurologic:  alert & oriented X3 and DTRs symmetrical and normal.   Skin:  Intact without suspicious lesions or rashes. 6X5 angioma L scalp Cervical Nodes:  No lymphadenopathy noted Axillary Nodes:  No palpable lymphadenopathy Psych:  memory intact for recent and remote, normally interactive, and good eye contact.     Impression & Recommendations:  Problem # 1:  ROUTINE GENERAL MEDICAL EXAM@HEALTH  CARE FACL (ICD-V70.0)  Orders: EKG w/ Interpretation (93000) Venipuncture (65784) TLB-BMP (Basic Metabolic Panel-BMET) (80048-METABOL) TLB-CBC Platelet - w/Differential (85025-CBCD) TLB-Hepatic/Liver Function Pnl (80076-HEPATIC) TLB-TSH (Thyroid Stimulating Hormone) (84443-TSH) T- * Misc. Laboratory test [phone])  Problem # 2:  HYPERTENSION (ICD-401.9)  The following  medications were removed from the medication list:    Metoprolol Tartrate 25 Mg Tabs (Metoprolol tartrate) .Marland Kitchen... 1 two times a day His updated medication list for this problem includes:    Amlodipine Besylate 5 Mg Tabs (Amlodipine besylate) .Marland Kitchen... 1 once daily  Problem # 3:  HYPERLIPIDEMIA (ICD-272.4)  His updated medication list for this problem includes:    Crestor 20 Mg Tabs (Rosuvastatin calcium) .Marland Kitchen... Daily as directed  Problem # 4:  FAMILY HISTORY OF ISCHEMIC HEART DISEASE (ICD-V17.3)  Problem # 5:  ARTHRITIS, RHEUMATOID (ICD-714.0) as per Dr Kellie Simmering  Complete Medication List: 1)  Methotrexate 2.5 Mg Tabs (Methotrexate sodium) .... Takes ten tabs one day a week 2)  Tylenol  .... As needed 3)  Crestor 20 Mg Tabs (Rosuvastatin calcium) .... Daily as directed 4)  Omeprazole Magnesium 20 Mg  .... One tablet by mouth daily 5)  Amlodipine Besylate 5 Mg Tabs (Amlodipine besylate) .Marland Kitchen.. 1 once daily  Lipid Assessment/Plan:      Based on NCEP/ATP III, the patient's risk factor category is "2 or more risk factors and a calculated 10 year CAD risk of < 20%".  The patient's lipid goals are as follows: Total cholesterol goal is 200; LDL cholesterol goal is 100; HDL cholesterol goal is 40; Triglyceride goal is 150.  His LDL cholesterol goal has been met.    Patient Instructions: 1)  It is important that you exercise regularly at least 20 minutes 5 times a week. If you develop chest  pain, have severe difficulty breathing, or feel very tired , stop exercising immediately and seek medical attention. 2)  Check your Blood Pressure regularly.Your goal = AVERAGE < 135/85 Prescriptions: AMLODIPINE BESYLATE 5 MG TABS (AMLODIPINE BESYLATE) 1 once daily  #90 x 1   Entered and Authorized by:   Marga Melnick MD   Signed by:   Marga Melnick MD on 08/18/2010   Method used:   Print then Give to Patient   RxID:   [id number] OMEPRAZOLE MAGNESIUM 20 MG one tablet by mouth daily  #90 x 3   Entered and  Authorized by:   Marga Melnick MD   Signed by:   Marga Melnick MD on 08/18/2010   Method used:   Print then Give to Patient   RxID:   [id number] CRESTOR 20 MG TABS (ROSUVASTATIN CALCIUM) daily as directed  #30 x 1   Entered and Authorized by:   Marga Melnick MD   Signed by:   Marga Melnick MD on 08/18/2010   Method used:   Print then Give to Patient   RxID:   [id number]    Orders Added: 1)  Est. Patient 40-64 years [99396] 2)  EKG w/ Interpretation [93000] 3)  Venipuncture [36415] 4)  TLB-BMP (Basic Metabolic Panel-BMET) [80048-METABOL] 5)  TLB-CBC Platelet - w/Differential [85025-CBCD] 6)  TLB-Hepatic/Liver Function Pnl [80076-HEPATIC] 7)  TLB-TSH (Thyroid Stimulating Hormone) [84443-TSH] 8)  T- * Misc. Laboratory test [phone]     Appended Document: cpx pt fasting/nta

## 2010-09-15 NOTE — Letter (Signed)
Summary: Cancer Screening/[facility] Personalized Risk Profile  Cancer Screening/[facility] Personalized Risk Profile   Imported By: Lanelle Bal 08/23/2010 14:19:24  _____________________________________________________________________  External Attachment:    Type:   Image     Comment:   External Document

## 2010-10-07 ENCOUNTER — Encounter: Payer: Self-pay | Admitting: Internal Medicine

## 2010-10-07 ENCOUNTER — Ambulatory Visit (INDEPENDENT_AMBULATORY_CARE_PROVIDER_SITE_OTHER): Payer: Managed Care, Other (non HMO) | Admitting: Internal Medicine

## 2010-10-07 DIAGNOSIS — L509 Urticaria, unspecified: Secondary | ICD-10-CM | POA: Insufficient documentation

## 2010-10-11 NOTE — Assessment & Plan Note (Signed)
Summary: rash on shoulders/kn   Vital Signs:  Patient profile:   45 year old male Weight:      230 pounds BMI:     32.19 Temp:     98.2 degrees F oral Pulse rate:   64 / minute Resp:     13 per minute BP sitting:   110 / 80  (left arm) Cuff size:   large  Vitals Entered By: Shonna Chock CMA (October 07, 2010 9:28 AM) CC: 1.) Rash, noticed this am: Itchy   2.) Examine area under scrotum   CC:  1.) Rash and noticed this am: Itchy   2.) Examine area under scrotum.  History of Present Illness:  Rash      This is a 45 year old man who presents with Rash as of 5 am . Itching & flushing   woke him.  The patient reports hives, itching, and redness, but denies increased warmth and tenderness.  The rash is located on the neck, left arm, and right thigh.  The patient denies the following symptoms: fever, headache, facial swelling, tongue swelling, difficulty breathing, abdominal pain, vomiting, diarrhea, sore throat, dysuria, eye symptoms, and change in arthralgias.    Current Medications (verified): 1)  Methotrexate 2.5 Mg  Tabs (Methotrexate Sodium) .... Takes7 Tabs One Day A Week 2)  Tylenol .... As Needed 3)  Crestor 20 Mg Tabs (Rosuvastatin Calcium) .... Daily As Directed 4)  Omeprazole Magnesium 20 Mg .... One Tablet By Mouth Daily 5)  Amlodipine Besylate 5 Mg Tabs (Amlodipine Besylate) .Marland Kitchen.. 1 Once Daily  Allergies (verified): No Known Drug Allergies  Physical Exam  General:  well-nourished,in no acute distress; alert,appropriate and cooperative throughout examination Eyes:  No corneal or conjunctival inflammation noted. EOMI. Perrla.  Nose:  External nasal examination shows no deformity or inflammation. Nasal mucosa are  dry without lesions or exudates. Mouth:  Oral mucosa and oropharynx without lesions or exudates.  Teeth in good repair. Lungs:  Normal respiratory effort, chest expands symmetrically. Lungs are clear to auscultation, no crackles or wheezes. Heart:  Normal rate  and regular rhythm. S1 and S2 normal without gallop, murmur, click, rub or other extra sounds. Abdomen:  Bowel sounds positive,abdomen soft and non-tender without masses, organomegaly or hernias noted. Skin:  Punctate erythema  behind scrotum on perineum Cervical Nodes:  No lymphadenopathy noted Axillary Nodes:  No palpable lymphadenopathy Inguinal Nodes:  No significant adenopathy   Impression & Recommendations:  Problem # 1:  URTICARIA (ICD-708.9) self limited  Complete Medication List: 1)  Methotrexate 2.5 Mg Tabs (Methotrexate sodium) .... Takes7 tabs one day a week 2)  Tylenol  .... As needed 3)  Crestor 20 Mg Tabs (Rosuvastatin calcium) .... Daily as directed 4)  Omeprazole Magnesium 20 Mg  .... One tablet by mouth daily 5)  Amlodipine Besylate 5 Mg Tabs (Amlodipine besylate) .Marland Kitchen.. 1 once daily 6)  Hydroxyzine Pamoate 25 Mg Caps (Hydroxyzine pamoate) .Marland Kitchen.. 1-2 every 6 hrs as needed 7)  Ranitidine Hcl 150 Mg Tabs (Ranitidine hcl) .Marland Kitchen.. 1 two times a day pre meals  Patient Instructions: 1)  Go to Web MD for hives or urticaria. bland diet X 48 hrs. Prescriptions: RANITIDINE HCL 150 MG TABS (RANITIDINE HCL) 1 two times a day pre meals  #60 x 0   Entered and Authorized by:   Marga Melnick MD   Signed by:   Marga Melnick MD on 10/07/2010   Method used:   Print then Give to Patient   RxID:  [id number] HYDROXYZINE PAMOATE 25 MG CAPS (HYDROXYZINE PAMOATE) 1-2 every 6 hrs as needed  #20 x 1   Entered and Authorized by:   Marga Melnick MD   Signed by:   Marga Melnick MD on 10/07/2010   Method used:   Print then Give to Patient   RxID:   [id number]    Orders Added: 1)  Est. Patient Level III [10272]

## 2010-12-30 NOTE — Op Note (Signed)
NAMEBRANDEN, Velazquez                  ACCOUNT NO.:  [id number]   MEDICAL RECORD NO.:  [id number]          PATIENT TYPE:  AMB   LOCATION:  [facility]                          FACILITY:  [facility]   PHYSICIAN:  Lubertha Basque. Dalldorf, M.D.DATE OF BIRTH:  Feb 08, 1966   DATE OF PROCEDURE:  12/11/2006  DATE OF DISCHARGE:                               OPERATIVE REPORT   PREOPERATIVE DIAGNOSES:  1. Right knee torn medial meniscus.  2. Right knee degenerative joint disease.   POSTOPERATIVE DIAGNOSES:  1. Right knee torn medial meniscus.  2. Right knee synovitis.   PROCEDURES:  1. Right knee partial medial meniscectomy.  2. Right knee synovial biopsy.   ANESTHESIA:  General.   ATTENDING SURGEON:  Lubertha Basque. Jerl Santos, M.D.   INDICATIONS FOR PROCEDURE:  The patient is a 45 year old man with a long  history of right knee pain and swelling.  This has persisted despite  oral anti-inflammatories and several aspirations and injections.  He  persists with an effusion and discomfort.  An MRI scan has been done  which looks relatively benign except for ruptured Baker's cyst.  He has  also had pain in some other joints including his wrist and back.  He is  offered an arthroscopy for diagnostic and hopefully therapeutic purposes  at this point.  He did have arthroscopy of this knee 8-9 years ago as  well.  Informed operative consent was obtained after discussion of  possible complications of reaction to anesthesia and infection.   SUMMARY OF FINDINGS AND PROCEDURE:  Under general anesthesia, an  arthroscopy of the right knee was performed.  Suprapatellar pouch was  benign as was the patellofemoral joint, though he did have some  significant synovial hypertrophy throughout.  In the medial compartment  there was a small free edge medial meniscus tear addressed with a tiny  partial medial meniscectomy.  The ACL appeared intact.  The lateral  meniscus also had a tiny tear addressed with a brief partial lateral  meniscectomy.  There were no degenerative changes that I could  appreciate.  He had an abundance of red inflamed synovium.  I did biopsy  this and sent it to pathology.   DESCRIPTION OF PROCEDURE:  The patient is taken to the operating suite  where general anesthetic was applied without difficulty.  He was  positioned supine and prepped, draped normal sterile fashion.  After the  administration of preoperative IV Kefzol, an arthroscopy of the right  knee was performed through a total of two inferior portals.  Findings  were as noted above and procedure consisted of the tiny partial medial  and lateral meniscectomies followed by synovial biopsy.  The knee was  thoroughly irrigated at the end of the case, followed by placement of  Marcaine with epinephrine and morphine plus Depo-Medrol.  Adaptic was  placed over the portals, followed by dry gauze and a loose Ace wrap.  Estimated blood loss and intraoperative fluids can be obtained from  anesthesia records.   DISPOSITION:  The patient was extubated in the operating room and taken  to the recovery room in  stable addition.  He was to go home the same day  and follow up in the office in less than a week.  I will contact him by  phone tonight.      Lubertha Basque Jerl Santos, M.D.  Electronically Signed     PGD/MEDQ  D:  12/11/2006  T:  12/11/2006  Job:  [id number]

## 2010-12-30 NOTE — Op Note (Signed)
[place]  Patient:    Robert Velazquez, Robert Velazquez                           MRN: [id number] Proc. Date: 09/04/00 Attending:  Lubertha Basque. Jerl Santos, M.D.                           Operative Report  PREOPERATIVE DIAGNOSIS:  Right knee torn medial meniscus.  POSTOPERATIVE DIAGNOSIS:  Right knee torn medial meniscus.  OPERATION PERFORMED:  Right knee partial medial meniscectomy.  ANESTHESIA:  General.  ATTENDING SURGEON:  Lubertha Basque. Jerl Santos, M.D.  ASSISTANT:  Lindwood Qua, P.A.  INDICATIONS FOR PROCEDURE:  The patient is a 45 year old man with a long history of right knee pain which has persisted despite various oral anti-inflammatories and activity restriction.  At this point he is offered an operative procedure which is an arthroscopy.  The procedure was discussed with the patient and informed operative consent was obtained after discussion of possible complications of reaction to anesthesia and infection.  DESCRIPTION OF PROCEDURE:  The patient was taken to an operating suite where general anesthetic was applied without difficulty.  He was then positioned supine and prepped and draped in normal sterile fashion.  After administration of preop intravenous antibiotics, an arthroscopy of the right knee was performed through two inferior portals.  The suprapatellar pouch was benign. The patellofemoral joint exhibited a mild area of chondromalacia in the groove which was addressed with a brief chondroplasty.  There was no more than grade 2 change present.  The patella tracked well.  In the medial compartment, he had an anterior horn medial meniscus tear which required removal of about 5% of the meniscus back to a stable rim.  The middle and posterior horns of the meniscus were benign and there were no degenerative changes in the compartment.  The ACL and the PCL were intact.  The lateral compartment was completely benign except for an abundance of loose material  felt probably to be emanating from the area of his cartilage tear in the other compartment. The knee was thoroughly irrigated followed by placement of Marcaine with epinephrine and morphine.  Adaptic was placed over the portals followed by dry gauze and a loose Ace wrap.  Estimated blood loss and intraoperative fluids can be obtained from Anesthesia records.  DISPOSITION:  The patient was extubated in the operating room and taken to the recovery room in stable condition.  Plans were for him to go home the same day and to follow up in the office in less than a week.  I will contact him by phone tonight. DD:  09/04/00 TD:  09/04/00 Job: [id number]

## 2011-02-07 ENCOUNTER — Other Ambulatory Visit: Payer: Self-pay | Admitting: Internal Medicine

## 2011-02-08 MED ORDER — AMLODIPINE BESYLATE 5 MG PO TABS: 5.0000 mg | ORAL_TABLET | Freq: Every day | ORAL | Status: DC

## 2011-03-21 ENCOUNTER — Encounter: Payer: Self-pay | Admitting: Internal Medicine

## 2011-07-13 ENCOUNTER — Encounter: Payer: Self-pay | Admitting: Internal Medicine

## 2011-07-13 ENCOUNTER — Ambulatory Visit (INDEPENDENT_AMBULATORY_CARE_PROVIDER_SITE_OTHER): Payer: Managed Care, Other (non HMO) | Admitting: Internal Medicine

## 2011-07-13 VITALS — BP 114/80 | HR 69 | Temp 97.7°F | Resp 16 | Wt 228.0 lb

## 2011-07-13 DIAGNOSIS — L723 Sebaceous cyst: Secondary | ICD-10-CM

## 2011-07-13 DIAGNOSIS — IMO0002 Reserved for concepts with insufficient information to code with codable children: Secondary | ICD-10-CM

## 2011-07-13 NOTE — Progress Notes (Signed)
  Subjective:    Patient ID: Robert Velazquez, male    DOB: 05-04-1966, 45 y.o.   MRN: [id number]  HPI Pt presents to clinic for evaluation of ST mass. Notes >62m h/o ST mass located midline upper gluteal cleft. No injury/trauma. Area is not draining. Recently has become tender and painful. No other alleviating or exacerbating factors. Taking no medication for the problem. No other complaints.  Past Medical History  Diagnosis Date  . Hyperlipidemia     NMR LDL 140(1854/1324)TG 102,HDL 33  . Arthritis     rheumatoid arth variant   Past Surgical History  Procedure Date  . Knee surgery     arthoscopic x2  . Mouth surgery     wisdom teeth & removed from palate & re-implated  . Tonsillectomy   . Epidural block injection     DDD s/p x3.Marland KitchenMarland KitchenDr.Tooke    reports that he has never smoked. He does not have any smokeless tobacco history on file. He reports that he does not drink alcohol. His drug history not on file. family history includes Arthritis in his mother and paternal grandmother; Heart disease (age of onset:55) in his paternal grandfather and paternal uncle; and Heart disease (age of onset:56) in his father. Allergies not on file     Review of Systems see hpi     Objective:   Physical Exam  Nursing note and vitals reviewed. Constitutional: He appears well-developed and well-nourished. No distress.  HENT:  Head: Normocephalic and atraumatic.  Neurological: He is alert.  Skin: Skin is warm and dry. No rash noted. He is not diaphoretic.       Upper midline gluteal cleft: ST mass 2-3cm in tranverse diameter. No redness or warmth. Small amount of expressible sebaceous material. No purulent discharge.  Psychiatric: He has a normal mood and affect.          Assessment & Plan:

## 2011-07-15 ENCOUNTER — Encounter: Payer: Self-pay | Admitting: Internal Medicine

## 2011-07-15 DIAGNOSIS — M660 Rupture of popliteal cyst: Secondary | ICD-10-CM | POA: Insufficient documentation

## 2011-07-15 NOTE — Assessment & Plan Note (Signed)
ST mass c/w cyst. +symptomatic. No obvious secondary infection. Surgery consult for consideration of excision.

## 2011-07-17 ENCOUNTER — Ambulatory Visit: Payer: Managed Care, Other (non HMO) | Admitting: Internal Medicine

## 2011-07-25 ENCOUNTER — Telehealth: Payer: Self-pay | Admitting: Internal Medicine

## 2011-07-25 MED ORDER — ROSUVASTATIN CALCIUM 20 MG PO TABS: 20.0000 mg | ORAL_TABLET | Freq: Every day | ORAL | Status: DC

## 2011-07-25 NOTE — Telephone Encounter (Signed)
RX sent

## 2011-07-25 NOTE — Telephone Encounter (Signed)
Patient needs refill crestor - [employer] home delivery - 90 day supply

## 2011-09-22 ENCOUNTER — Other Ambulatory Visit: Payer: Self-pay | Admitting: Plastic Surgery

## 2011-09-26 ENCOUNTER — Ambulatory Visit (INDEPENDENT_AMBULATORY_CARE_PROVIDER_SITE_OTHER): Payer: 59 | Admitting: Internal Medicine

## 2011-09-26 ENCOUNTER — Encounter: Payer: Self-pay | Admitting: Internal Medicine

## 2011-09-26 VITALS — BP 128/90 | HR 67 | Temp 98.1°F | Resp 12 | Wt 228.0 lb

## 2011-09-26 DIAGNOSIS — Z0000 Encounter for general adult medical examination without abnormal findings: Secondary | ICD-10-CM

## 2011-09-26 DIAGNOSIS — R9431 Abnormal electrocardiogram [ECG] [EKG]: Secondary | ICD-10-CM

## 2011-09-26 DIAGNOSIS — E785 Hyperlipidemia, unspecified: Secondary | ICD-10-CM

## 2011-09-26 NOTE — Patient Instructions (Signed)
Preventive Health Care: Exercise at least 30-45 minutes a day,  3-4 days a week.  Eat a low-fat diet with lots of fruits and vegetables, up to 7-9 servings per day. Consume less than 40 grams of sugar per day from foods & drinks with High Fructose Corn Sugar as # 1,2,3 or # 4 on label.  

## 2011-09-26 NOTE — Progress Notes (Signed)
Subjective:    Patient ID: Robert Velazquez, male    DOB: 1965/11/23, 46 y.o.   MRN: [id number]  HPI  Mr. Rosner  is here for a physical;acute issues include ongoing arthralgias. His rheumatoid arthritis has improved and he is on 6 methotrexate a week, down from a high of 10 per week      Review of Systems Patient reports no  vision/ hearing changes,anorexia, weight change, fever ,adenopathy, persistant / recurrent hoarseness, swallowing issues, chest pain,palpitations, edema,persistant / recurrent cough, hemoptysis, dyspnea(rest, exertional, paroxysmal nocturnal), gastrointestinal  bleeding (melena, rectal bleeding), abdominal pain, excessive heart burn, GU symptoms( dysuria, hematuria, pyuria, voiding/incontinence  issues) syncope, focal weakness, memory loss,numbness & tingling, skin/hair/nail changes,depression, anxiety,or  abnormal bruising/bleeding.  He exercises for 30-45 minutes 3 days a week at a  high  cardiovascular level as kickboxing, aerobics and body weights w/o symptoms     Objective:   Physical Exam Gen.: Healthy and well-nourished in appearance. Alert, appropriate and cooperative throughout exam. Head: Normocephalic without obvious abnormalities; pattern alopecia  Eyes: No corneal or conjunctival inflammation noted. Pupils equal round reactive to light and accommodation. Fundal exam is benign without hemorrhages, exudate, papilledema. Extraocular motion intact. Vision grossly normal with lenses. Ears: External  ear exam reveals no significant lesions or deformities. Canals clear .TMs normal. Hearing is grossly normal bilaterally. Nose: External nasal exam reveals no deformity or inflammation. Nasal mucosa are pink and moist. No lesions or exudates noted.   Mouth: Oral mucosa and oropharynx reveal no lesions or exudates. Teeth in good repair. Neck: No deformities, masses, or tenderness noted. Range of motion & Thyroid normal. Lungs: Normal respiratory effort; chest expands  symmetrically. Lungs are clear to auscultation without rales, wheezes, or increased work of breathing. Heart: Normal rate and rhythm. Normal S1 and S2. No gallop, click, or rub. No  murmur. Abdomen: Bowel sounds normal; abdomen soft and nontender. No masses, organomegaly or hernias noted. Genitalia/ DRE: The genital exam is unremarkable . Prostate is normal without enlargement, nodularity, asymmetry, or induration.                                                               Musculoskeletal/extremities: No deformity or scoliosis noted of  the thoracic or lumbar spine. No clubbing, cyanosis, or edema. Distal  deformity noted L index finger. Range of motion  normal .Tone & strength  normal.Joints normal. Nail health  good. Vascular: Carotid, radial artery, dorsalis pedis and  posterior tibial pulses are full and equal. No bruits present. Neurologic: Alert and oriented x3. Deep tendon reflexes symmetrical and normal.          Skin: Intact without suspicious lesions or rashes. Lymph: No cervical, axillary, or inguinal lymphadenopathy present. Psych: Mood and affect are normal. Normally interactive                                                                                        Assessment &  Plan:  #1 comprehensive physical exam; no acute findings #2 see Problem List with Assessments & Recommendations  #3 EKG reveals nonspecific T changes. The T wave appears biphasic in aVF and V3 to V5. These changes were seen slightly in V3- V4 in January 2012. As noted he has no symptoms despite high level of exercise. Plan: see Orders

## 2011-09-27 LAB — CBC WITH DIFFERENTIAL/PLATELET
Basophils Relative: 0.1 % (ref 0.0–3.0)
Eosinophils Relative: 2 % (ref 0.0–5.0)
HCT: 48.8 % (ref 39.0–52.0)
Lymphs Abs: 3 10*3/uL (ref 0.7–4.0)
MCV: 96.1 fl (ref 78.0–100.0)
Monocytes Absolute: 0.4 10*3/uL (ref 0.1–1.0)
Neutro Abs: 2.3 10*3/uL (ref 1.4–7.7)
RBC: 5.07 Mil/uL (ref 4.22–5.81)
WBC: 5.8 10*3/uL (ref 4.5–10.5)

## 2011-09-27 LAB — LIPID PANEL
Cholesterol: 181 mg/dL (ref 0–200)
Total CHOL/HDL Ratio: 5
Triglycerides: 92 mg/dL (ref 0.0–149.0)

## 2011-09-27 LAB — BASIC METABOLIC PANEL
CO2: 25 mEq/L (ref 19–32)
Chloride: 108 mEq/L (ref 96–112)
Potassium: 4.3 mEq/L (ref 3.5–5.1)

## 2011-09-27 LAB — HEPATIC FUNCTION PANEL
ALT: 45 U/L (ref 0–53)
Albumin: 4.2 g/dL (ref 3.5–5.2)
Total Protein: 6.8 g/dL (ref 6.0–8.3)

## 2011-09-28 ENCOUNTER — Telehealth: Payer: Self-pay | Admitting: *Deleted

## 2011-09-28 MED ORDER — EZETIMIBE 10 MG PO TABS: 10.0000 mg | ORAL_TABLET | Freq: Every day | ORAL | Status: DC

## 2011-09-28 NOTE — Telephone Encounter (Signed)
Discuss with patient, Rx sent. 

## 2011-09-28 NOTE — Telephone Encounter (Signed)
Message copied by Verdene Rio on Thu Sep 28, 2011 11:19 AM ------      Message from: Pecola Lawless      Created: Wed Sep 27, 2011  6:30 PM       Please send new  Rx for Zetia 10 mg #90 , RX 3

## 2011-10-02 ENCOUNTER — Ambulatory Visit (INDEPENDENT_AMBULATORY_CARE_PROVIDER_SITE_OTHER): Payer: 59 | Admitting: Physician Assistant

## 2011-10-02 DIAGNOSIS — R9431 Abnormal electrocardiogram [ECG] [EKG]: Secondary | ICD-10-CM

## 2011-10-02 NOTE — Progress Notes (Signed)
Exercise Treadmill Test  Pre-Exercise Testing Evaluation Rhythm: normal sinus  Rate: 71   PR:  .16 QRS:  .09  QT:  .36 QTc: .39     Test  Exercise Tolerance Test Ordering MD: Marga Melnick MD  Interpreting MD:  Jacolyn Reedy PA-C  Unique Test No: 1  Treadmill:  1  Indication for ETT: Abnormal EKG  Contraindication to ETT: No   Stress Modality: exercise - treadmill  Cardiac Imaging Performed: non   Protocol: standard Bruce - maximal  Max BP:  204/82  Max MPHR (bpm):  173 85% MPR (bpm):  148  MPHR obtained (bpm): 162 % MPHR obtained: 93%  Reached 85% MPHR (min:sec): 8:14 Total Exercise Time (min-sec):  9:35  Workload in METS:  11.0 Borg Scale: 13  Reason ETT Terminated: knee/ankle pain    ST Segment Analysis At Rest: normal ST segments - no evidence of significant ST depression With Exercise: no evidence of significant ST depression  Other Information Arrhythmia:  Yes Angina during ETT:  absent (0) Quality of ETT:  diagnostic  ETT Interpretation:  normal - no evidence of ischemia by ST analysis  Comments: Good exercise tolerance. Frequent PVC's before, during and after exercise. Patient asymptomatic.  Recommendations: Follow up with Dr. Alwyn Ren

## 2012-03-15 ENCOUNTER — Telehealth: Payer: Self-pay | Admitting: *Deleted

## 2012-03-15 NOTE — Telephone Encounter (Signed)
Pt notes that he is taking a trip to [place] and is aware that he will be flying for a long time thus pt is worried about blood clots and wanted to clarify if it is ok for him to take Aspirin 81mg  to prevent blood clots per did not want to take if it will contradict with any of his other medications, please advise

## 2012-03-15 NOTE — Telephone Encounter (Signed)
Please take enteric-coated aspirin 325 mg daily with breakfast while traveling. Wear good support stockings/socks and ambulate as much as you can during the prolonged air flight.Please perform isometric exercises while seated every hour in flight to contract calf muscles & prevent venous pooling with increased risk of clot formation

## 2012-03-15 NOTE — Telephone Encounter (Signed)
Discuss with patient  

## 2012-04-18 ENCOUNTER — Telehealth: Payer: Self-pay | Admitting: *Deleted

## 2012-04-18 NOTE — Telephone Encounter (Signed)
Pt states that Zetia is going to cost him $100 a month since insurance changed and would like to know if there is a cheaper alternative. .Please advise

## 2012-04-18 NOTE — Telephone Encounter (Signed)
Discuss with patient, samples placed up front for pick up.  

## 2012-04-18 NOTE — Telephone Encounter (Signed)
There is no generic for Zetia; please pick up samples and a coupon.

## 2012-06-05 ENCOUNTER — Other Ambulatory Visit: Payer: Self-pay

## 2012-06-05 MED ORDER — AMLODIPINE BESYLATE 5 MG PO TABS: 5.0000 mg | ORAL_TABLET | Freq: Every day | ORAL | Status: DC

## 2012-06-05 MED ORDER — ROSUVASTATIN CALCIUM 20 MG PO TABS: 20.0000 mg | ORAL_TABLET | Freq: Every day | ORAL | Status: DC

## 2012-06-05 NOTE — Telephone Encounter (Signed)
Rx sent pt aware.   MW  

## 2012-06-17 ENCOUNTER — Telehealth: Payer: Self-pay

## 2012-06-17 DIAGNOSIS — E785 Hyperlipidemia, unspecified: Secondary | ICD-10-CM

## 2012-06-17 DIAGNOSIS — T887XXA Unspecified adverse effect of drug or medicament, initial encounter: Secondary | ICD-10-CM

## 2012-06-17 MED ORDER — SIMVASTATIN 20 MG PO TABS: 20.0000 mg | ORAL_TABLET | Freq: Every evening | ORAL | Status: DC

## 2012-06-17 NOTE — Telephone Encounter (Signed)
Spoke with patient, patient verbalized understanding of Dr.Hopper's recommendations. Scheduled appointment to recheck labs in Jan 2014

## 2012-06-17 NOTE — Telephone Encounter (Signed)
Simvastatin 20 mg at bedtime dispense 90. Fasting labs after 10 weeks of therapy with followup office visit to-3 days later. PLEASE BRING THESE INSTRUCTIONS TO FOLLOW UP  LAB APPOINTMENT.This will guarantee correct labs are drawn, eliminating need for repeat blood sampling ( needle sticks ! ). Diagnoses /Codes: lipids,hepatic panel, CK. 272.4,995.20.

## 2012-06-17 NOTE — Telephone Encounter (Signed)
Pt states Crestor isn't covered under his insurance and will be $650 need an alternative and Zetia is $100 need cheaper med.  PLz advise       MW

## 2012-08-20 ENCOUNTER — Other Ambulatory Visit (INDEPENDENT_AMBULATORY_CARE_PROVIDER_SITE_OTHER): Payer: BC Managed Care – PPO

## 2012-08-20 ENCOUNTER — Ambulatory Visit: Payer: BC Managed Care – PPO

## 2012-08-20 DIAGNOSIS — M069 Rheumatoid arthritis, unspecified: Secondary | ICD-10-CM

## 2012-08-20 DIAGNOSIS — Z79899 Other long term (current) drug therapy: Secondary | ICD-10-CM

## 2012-08-20 DIAGNOSIS — R7309 Other abnormal glucose: Secondary | ICD-10-CM

## 2012-08-20 DIAGNOSIS — T887XXA Unspecified adverse effect of drug or medicament, initial encounter: Secondary | ICD-10-CM

## 2012-08-20 DIAGNOSIS — E785 Hyperlipidemia, unspecified: Secondary | ICD-10-CM

## 2012-08-20 LAB — HEPATIC FUNCTION PANEL
ALT: 36 U/L (ref 0–53)
Bilirubin, Direct: 0.2 mg/dL (ref 0.0–0.3)
Total Bilirubin: 1.5 mg/dL — ABNORMAL HIGH (ref 0.3–1.2)

## 2012-08-20 LAB — CBC
RDW: 14.6 % (ref 11.5–14.6)
WBC: 6.4 10*3/uL (ref 4.5–10.5)

## 2012-08-20 LAB — COMPREHENSIVE METABOLIC PANEL
Albumin: 4.1 g/dL (ref 3.5–5.2)
CO2: 27 mEq/L (ref 19–32)
Calcium: 8.7 mg/dL (ref 8.4–10.5)
Chloride: 106 mEq/L (ref 96–112)
GFR: 89.83 mL/min (ref >60.00)
Glucose, Bld: 113 mg/dL — ABNORMAL HIGH (ref 70–99)
Potassium: 3.8 mEq/L (ref 3.5–5.1)
Sodium: 140 mEq/L (ref 135–145)
Total Bilirubin: 1.5 mg/dL — ABNORMAL HIGH (ref 0.3–1.2)
Total Protein: 6.6 g/dL (ref 6.0–8.3)

## 2012-08-20 LAB — LIPID PANEL: VLDL: 15.6 mg/dL (ref 0.0–40.0)

## 2012-08-20 NOTE — Progress Notes (Signed)
Send results of CBC no diff and CMP to Dr Stacey Drain

## 2012-08-20 NOTE — Progress Notes (Signed)
Labs only

## 2012-09-16 ENCOUNTER — Ambulatory Visit (INDEPENDENT_AMBULATORY_CARE_PROVIDER_SITE_OTHER): Payer: BC Managed Care – PPO | Admitting: Internal Medicine

## 2012-09-16 ENCOUNTER — Encounter: Payer: Self-pay | Admitting: Internal Medicine

## 2012-09-16 VITALS — BP 134/84 | HR 73 | Temp 97.9°F | Wt 233.0 lb

## 2012-09-16 DIAGNOSIS — J069 Acute upper respiratory infection, unspecified: Secondary | ICD-10-CM

## 2012-09-16 MED ORDER — AMOXICILLIN 500 MG PO CAPS: 1000.0000 mg | ORAL_CAPSULE | Freq: Two times a day (BID) | ORAL | Status: AC

## 2012-09-16 MED ORDER — AZELASTINE HCL 0.1 % NA SOLN: 2.0000 | Freq: Two times a day (BID) | NASAL | Status: DC

## 2012-09-16 NOTE — Patient Instructions (Signed)
Rest, fluids , tylenol For cough, take Mucinex   twice a day as needed  For congestion use astelin nasal spray twice a day until you feel better Nasal irrigation daily Take the antibiotic as prescribed  (Amoxicillin) only if you are not getting better in the next 2-3 days. Call if no better  by next week Call anytime if the symptoms are severe

## 2012-09-16 NOTE — Progress Notes (Signed)
  Subjective:    Patient ID: Robert Velazquez, male    DOB: 11/24/1965, 47 y.o.   MRN: [id number]  HPI Acute visit Symptoms started about a week ago with "a head cold" Yesterday for the first time he felt feverish, his temperature was 100.5. His taking OTC meds such as NyQuil, some nasal discharge but not abundant.   Past Medical History  Diagnosis Date  . Hyperlipidemia     NMR LDL 140(1854/1324)TG 102,HDL 33  . Arthritis     rheumatoid arthritis variant; Dr Reeves Dam   Past Surgical History  Procedure Date  . Knee surgery     arthoscopic x2  . Mouth surgery     wisdom teeth & tooth removed from palate & re-implated  . Tonsillectomy   . Epidural block injection     DDD s/p x3;Dr.Dahldorf      Review of Systems No nausea, vomiting, diarrhea. No myalgias. Very mild chest congestion. Mild cough which is dry.    Objective:   Physical Exam General -- alert, well-developed, NAD.   HEENT -- TM R wax, L normal; throat w/o redness, face symmetric and not tender to palpation, voice quite congested, voice nasal Lungs -- normal respiratory effort, no intercostal retractions, no accessory muscle use, and normal breath sounds. (Very few rhonchi that clear with cough) Heart-- normal rate, regular rhythm, no murmur, and no gallop.    Psych-- Cognition and judgment appear intact. Alert and cooperative with normal attention span and concentration.  not anxious appearing and not depressed appearing.       Assessment & Plan:   URI, he is quite congested in the sinuses, see instructions.

## 2012-09-28 ENCOUNTER — Other Ambulatory Visit: Payer: Self-pay

## 2012-10-02 ENCOUNTER — Telehealth: Payer: Self-pay | Admitting: Internal Medicine

## 2012-10-02 MED ORDER — EZETIMIBE 10 MG PO TABS: 10.0000 mg | ORAL_TABLET | Freq: Every day | ORAL | Status: DC

## 2012-10-02 NOTE — Telephone Encounter (Signed)
refill Zetia #30 Take 1 Tablet by mouth daily last fill 9.28.13

## 2012-10-02 NOTE — Telephone Encounter (Signed)
RX called in .

## 2012-11-08 ENCOUNTER — Encounter: Payer: Self-pay | Admitting: Internal Medicine

## 2012-11-08 ENCOUNTER — Ambulatory Visit (INDEPENDENT_AMBULATORY_CARE_PROVIDER_SITE_OTHER): Payer: BC Managed Care – PPO | Admitting: Internal Medicine

## 2012-11-08 VITALS — BP 116/80 | HR 72 | Temp 97.9°F | Resp 12 | Ht 71.08 in | Wt 233.0 lb

## 2012-11-08 DIAGNOSIS — Z0000 Encounter for general adult medical examination without abnormal findings: Secondary | ICD-10-CM

## 2012-11-08 LAB — HEPATIC FUNCTION PANEL
Alkaline Phosphatase: 47 U/L (ref 39–117)
Bilirubin, Direct: 0.2 mg/dL (ref 0.0–0.3)
Total Bilirubin: 0.9 mg/dL (ref 0.3–1.2)

## 2012-11-08 LAB — LIPID PANEL
HDL: 35.6 mg/dL — ABNORMAL LOW (ref >39.00)
Triglycerides: 50 mg/dL (ref 0.0–149.0)
VLDL: 10 mg/dL (ref 0.0–40.0)

## 2012-11-08 NOTE — Patient Instructions (Addendum)
Preventive Health Care: Eat a low-fat diet with lots of fruits and vegetables, up to 7-9 servings per day. This would eliminate the need for vitamin supplements. Consume less than 40 grams of sugar (preferably ZERO) per day from foods & drinks with High Fructose Corn Sugar as #1,2,3 or # 4 on label.  To prevent palpitations or premature beats, avoid stimulants such as decongestants, diet pills, nicotine, or caffeine (coffee, tea, cola, or chocolate) to excess.                                                     Monthly self genital exam recommended to monitor granuloma.  Please take the probiotic , Align, every day if stools loose. This will replace the normal bacteria which  are necessary for formation of normal stool and processing of food.  Review and correct the record as indicated. Please share record with all medical staff seen.

## 2012-11-08 NOTE — Progress Notes (Signed)
  Subjective:    Patient ID: Robert Velazquez, male    DOB: 1965-12-19, 47 y.o.   MRN: [id number]  HPI  He is here for a physical;acute issues include some GI symptoms.     Review of Systems He is not on a heart healthy diet; he exercises 30-45  minutes 3-4 times per week without symptoms. Specifically he denies chest pain, palpitations, dyspnea, or claudication. Family history is positive for premature coronary disease. Advanced cholesterol testing reveals his LDL goal was less than 100.  He has some intermittent central abdominal bloating especially after meals without significant dyspepsia, dysphagia,abd pain, unexplained weight loss, constipation , diarrhea,melena, or rectal bleeding.    Objective:   Physical Exam Gen.: Well-nourished in appearance. Alert, appropriate and cooperative throughout exam. Appears younger than stated age  Head: Normocephalic without obvious abnormalities;  pattern alopecia  Eyes: No corneal or conjunctival inflammation noted. Pupils equal round reactive to light and accommodation.  Extraocular motion intact. Vision grossly normal with lenses Ears: External  ear exam reveals no significant lesions or deformities. Canals clear .TMs normal. Hearing is grossly normal bilaterally. Nose: External nasal exam reveals no deformity or inflammation. Nasal mucosa are pink and moist. No lesions or exudates noted.   Mouth: Oral mucosa and oropharynx reveal no lesions or exudates. Teeth in good repair. Neck: No deformities, masses, or tenderness noted. Range of motion & Thyroid normal. Lungs: Normal respiratory effort; chest expands symmetrically. Lungs are clear to auscultation without rales, wheezes, or increased work of breathing. Heart: Normal rate and rhythm. Normal S1 and S2. No gallop, click, or rub. S4 w/o murmur. Abdomen: Bowel sounds normal; abdomen soft and nontender. No masses, organomegaly or hernias noted. Genitalia: Genitalia normal except for right granuloma &  left varices. Prostate is normal without enlargement, asymmetry, nodularity, or induration.                                Musculoskeletal/extremities: No deformity or scoliosis noted of  the thoracic or lumbar spine.  No clubbing, cyanosis, edema, or significant extremity  deformity noted. Range of motion normal .Tone & strength  Normal. Joints normal . Nail health good. Able to lie down & sit up w/o help. Negative SLR bilaterally Vascular: Carotid, radial artery, dorsalis pedis and  posterior tibial pulses are full and equal. No bruits present. Neurologic: Alert and oriented x3. Deep tendon reflexes symmetrical and normal.       Skin: Intact without suspicious lesions or rashes. Lymph: No cervical, axillary, or inguinal lymphadenopathy present. Psych: Mood and affect are normal. Normally interactive                                                                                      Assessment & Plan:  #1 comprehensive physical exam; no acute findings  Plan: see Orders  & Recommendations

## 2013-01-09 ENCOUNTER — Other Ambulatory Visit: Payer: Self-pay | Admitting: *Deleted

## 2013-01-09 MED ORDER — SIMVASTATIN 20 MG PO TABS: 20.0000 mg | ORAL_TABLET | Freq: Every evening | ORAL | Status: DC

## 2013-01-09 NOTE — Telephone Encounter (Signed)
Rx sent 

## 2013-02-03 ENCOUNTER — Encounter (HOSPITAL_BASED_OUTPATIENT_CLINIC_OR_DEPARTMENT_OTHER): Payer: Self-pay | Admitting: *Deleted

## 2013-02-03 ENCOUNTER — Emergency Department (HOSPITAL_BASED_OUTPATIENT_CLINIC_OR_DEPARTMENT_OTHER)
Admission: EM | Admit: 2013-02-03 | Discharge: 2013-02-03 | Disposition: A | Discharge status: Home / Self Care | Payer: BC Managed Care – PPO | Attending: Emergency Medicine | Admitting: Emergency Medicine

## 2013-02-03 DIAGNOSIS — S61409A Unspecified open wound of unspecified hand, initial encounter: Secondary | ICD-10-CM | POA: Insufficient documentation

## 2013-02-03 DIAGNOSIS — E785 Hyperlipidemia, unspecified: Secondary | ICD-10-CM | POA: Insufficient documentation

## 2013-02-03 DIAGNOSIS — Y9389 Activity, other specified: Secondary | ICD-10-CM | POA: Insufficient documentation

## 2013-02-03 DIAGNOSIS — Z23 Encounter for immunization: Secondary | ICD-10-CM | POA: Insufficient documentation

## 2013-02-03 DIAGNOSIS — Z79899 Other long term (current) drug therapy: Secondary | ICD-10-CM | POA: Insufficient documentation

## 2013-02-03 DIAGNOSIS — M069 Rheumatoid arthritis, unspecified: Secondary | ICD-10-CM | POA: Insufficient documentation

## 2013-02-03 DIAGNOSIS — W260XXA Contact with knife, initial encounter: Secondary | ICD-10-CM | POA: Insufficient documentation

## 2013-02-03 DIAGNOSIS — I10 Essential (primary) hypertension: Secondary | ICD-10-CM | POA: Insufficient documentation

## 2013-02-03 DIAGNOSIS — Y9289 Other specified places as the place of occurrence of the external cause: Secondary | ICD-10-CM | POA: Insufficient documentation

## 2013-02-03 DIAGNOSIS — S61412A Laceration without foreign body of left hand, initial encounter: Secondary | ICD-10-CM

## 2013-02-03 HISTORY — DX: Essential (primary) hypertension: I10

## 2013-02-03 MED ORDER — TETANUS-DIPHTH-ACELL PERTUSSIS 5-2.5-18.5 LF-MCG/0.5 IM SUSP
0.5000 mL | Freq: Once | INTRAMUSCULAR | Status: AC
  Administered 2013-02-03: 0.5 mL via INTRAMUSCULAR
  Filled 2013-02-03: qty 0.5

## 2013-02-03 MED ORDER — CEPHALEXIN 500 MG PO CAPS: 500.0000 mg | ORAL_CAPSULE | Freq: Three times a day (TID) | ORAL | Status: DC

## 2013-02-03 NOTE — ED Provider Notes (Signed)
History     This chart was scribed for Robert Bucco, MD by Jiles Prows, ED Scribe. The patient was seen in room MH03/MH03 and the patient's care was started at 9:55 PM.   CSN: [id number] Arrival date & time 02/03/13  2132   Chief Complaint  Patient presents with  . Laceration    Patient is a 47 y.o. male presenting with skin laceration. The history is provided by the patient and medical records. No language interpreter was used.  Laceration Location:  Hand Hand laceration location:  L hand Length (cm):  2 Depth:  Cutaneous Quality: straight   Bleeding: controlled   Laceration mechanism:  Knife Foreign body present:  No foreign bodies Tetanus status:  Unknown  HPI Comments: Robert Velazquez Velazquez is a 47 y.o. male who presents to the Emergency Department complaining of mild to moderate constant pain to left dorsum of hand after he accidentally cut it with a utility knife this evening.  Pt reports he was cutting towards his body with his right hand when he slipped and sliced the back of his left hand.  Pt denies headache, diaphoresis, fever, chills, nausea, vomiting, diarrhea, weakness, cough, SOB and any other pain.   Tetanus is not UTD.  Past Medical History  Diagnosis Date  . Hyperlipidemia     NMR LDL 140(1854/1324)TG 102,HDL 33  . Arthritis     rheumatoid arthritis variant; Dr Reeves Dam  . Hypertension    Past Surgical History  Procedure Laterality Date  . Knee surgery      arthoscopic x2  . Mouth surgery      wisdom teeth & tooth removed from palate & re-implated  . Tonsillectomy    . Epidural block injection  2011-2014    ? 6-7 total DDD ;Dr.Dahldorf & Dr Modesto Charon   Family History  Problem Relation Age of Onset  . Arthritis Mother   . Heart disease Father 27    MI initially @ 34;died @ 69 (3rd MI)  . Heart disease Paternal Uncle 35    MI  . Arthritis Paternal Grandmother     ??RA  . Heart disease Paternal Grandfather 45    MI  . Lung cancer Maternal Grandfather   .  Diabetes Neg Hx   . Stroke Neg Hx    History  Substance Use Topics  . Smoking status: Never Smoker   . Smokeless tobacco: Never Used  . Alcohol Use: No    Review of Systems  Constitutional: Negative for fever.  HENT: Negative for neck pain.   Gastrointestinal: Negative for nausea and vomiting.  Musculoskeletal: Negative for back pain and arthralgias.  Skin: Positive for wound.  Neurological: Negative for weakness and numbness.    Allergies  Review of patient's allergies indicates no known allergies.  Home Medications   Current Outpatient Rx  Name  Route  Sig  Dispense  Refill  . amLODipine (NORVASC) 5 MG tablet   Oral   Take 1 tablet (5 mg total) by mouth daily.   90 tablet   2   . ezetimibe (ZETIA) 10 MG tablet   Oral   Take 1 tablet (10 mg total) by mouth daily.   90 tablet   2   . folic acid (FOLVITE) 400 MCG tablet   Oral   Take 400 mcg by mouth daily.         . methotrexate (RHEUMATREX) 2.5 MG tablet   Oral   Take 2.5 mg by mouth once a week. Caution:Chemotherapy. Protect from  light.          . simvastatin (ZOCOR) 20 MG tablet   Oral   Take 1 tablet (20 mg total) by mouth every evening. In place of Crestor   90 tablet   1   . acetaminophen (TYLENOL) 325 MG tablet   Oral   Take 650 mg by mouth as needed.           . cephALEXin (KEFLEX) 500 MG capsule   Oral   Take 1 capsule (500 mg total) by mouth 3 (three) times daily.   21 capsule   0   . omeprazole (PRILOSEC) 20 MG capsule   Oral   Take 20 mg by mouth daily. As needed, will take this or Zantac          BP 149/85  Pulse 66  Temp(Src) 98 F (36.7 C) (Oral)  Resp 16  Ht 6' (1.829 m)  Wt 221 lb (100.245 kg)  BMI 29.97 kg/m2  SpO2 100% Physical Exam  Constitutional: He is oriented to person, place, and time. He appears well-developed and well-nourished.  HENT:  Head: Normocephalic and atraumatic.  Neck: Normal range of motion. Neck supple.  No pain to neck or back   Musculoskeletal:  Normal extension of the 2nd digit against resistance.  Normal sensation to LT distally.  CRT<2 distally  Neurological: He is alert and oriented to person, place, and time.  Skin: Skin is warm and dry.  2cm laceration to the dorsum of the left hand just proximal to the 2nd MCP joint.  No active bleeding    ED Course  LACERATION REPAIR Date/Time: 02/03/2013 10:35 PM Performed by: Wealthy Danielski Authorized by: Robert Velazquez Consent: Verbal consent obtained. Risks and benefits: risks, benefits and alternatives were discussed Consent given by: patient Body area: upper extremity Location details: left hand Laceration length: 2 cm Tendon involvement: none Nerve involvement: none Vascular damage: no Anesthesia: local infiltration Local anesthetic: lidocaine 2% without epinephrine Anesthetic total: 1 ml Patient sedated: no Preparation: Patient was prepped and draped in the usual sterile fashion. Irrigation solution: saline Irrigation method: syringe Amount of cleaning: standard Debridement: none Skin closure: 5-0 Prolene Number of sutures: 5 Technique: simple Approximation: close Approximation difficulty: simple Dressing: 4x4 sterile gauze Patient tolerance: Patient tolerated the procedure well with no immediate complications.   (including critical care time) DIAGNOSTIC STUDIES: Oxygen Saturation is 100% on RA, normal by my interpretation.    COORDINATION OF CARE: 9:58 PM - Discussed ED treatment with pt at bedside including tetanus and stitches and pt agrees.     Labs Reviewed - No data to display No results found. 1. Laceration of hand, left, initial encounter     MDM  TDAP updated.  Given wound care instructions.  Advised to f/u as needed for signs of infection    I personally performed the services described in this documentation, which was scribed in my presence.  The recorded information has been reviewed and considered.    Robert Bucco,  MD 02/03/13 2236

## 2013-02-03 NOTE — ED Notes (Signed)
Laceration to left hand from utility knife, bleeding controlled at this time by bandage placed PTA. +PMS

## 2013-06-19 ENCOUNTER — Other Ambulatory Visit: Payer: Self-pay

## 2013-08-18 ENCOUNTER — Encounter: Payer: Self-pay | Admitting: Internal Medicine

## 2013-08-18 ENCOUNTER — Ambulatory Visit (INDEPENDENT_AMBULATORY_CARE_PROVIDER_SITE_OTHER): Payer: BC Managed Care – PPO | Admitting: Internal Medicine

## 2013-08-18 VITALS — BP 132/86 | HR 64 | Temp 98.7°F | Resp 16 | Wt 222.0 lb

## 2013-08-18 DIAGNOSIS — J209 Acute bronchitis, unspecified: Secondary | ICD-10-CM

## 2013-08-18 MED ORDER — HYDROCODONE-HOMATROPINE 5-1.5 MG/5ML PO SYRP: 5.0000 mL | ORAL_SOLUTION | Freq: Four times a day (QID) | ORAL | Status: DC | PRN

## 2013-08-18 MED ORDER — AZITHROMYCIN 250 MG PO TABS: ORAL_TABLET | ORAL | Status: DC

## 2013-08-18 NOTE — Patient Instructions (Addendum)

## 2013-08-18 NOTE — Progress Notes (Signed)
   Subjective:    Patient ID: Robert Velazquez, male    DOB: 11/20/1965, 48 y.o.   MRN: [id number]  HPI  Symptoms began 08/16/13 is a nonproductive cough & chest congestion. The symptoms have worsened despite using NyQuil, Mucinex, & Tylenol.  He's had some headaches in the upper lateral anterior temple areas. He's also had low grade fever up to 99.8.  He has produced some brownish sputum; > 3 tsp/day..    Review of Systems He denies extrinsic symptoms of itchy, watery eyes, sneezing. He's had some pressure in the frontal sinuses but no significant facial pain, dental pain, otic pain, otic discharge. The sore throat is related to the cough. No chills, sweats, myalgias or arthralgias.       Objective:   Physical Exam General appearance:good health ;well nourished; no acute distress or increased work of breathing is present.  No  lymphadenopathy about the head, neck, or axilla noted.   Eyes: No conjunctival inflammation or lid edema is present.  Ears:  External ear exam shows no significant lesions or deformities.  Otoscopic examination reveals small amount of wax covering tympanic membranes . Nose:  External nasal examination shows no deformity or inflammation. Nasal mucosa are erythematous & moist without lesions or exudates. No septal dislocation or deviation.No obstruction to airflow.   Oral exam: Dental hygiene is good; lips and gums are healthy appearing.There is no oropharyngeal erythema or exudate noted.   Neck:  No deformities,  masses, or tenderness noted.     Heart:  Normal rate and regular rhythm. S1 and S2 normal without gallop, murmur, click, rub or other extra sounds.   Lungs:Chest clear to auscultation; no wheezes, rhonchi,rales ,or rubs present.No increased work of breathing.    Extremities:  No cyanosis, edema, or clubbing  noted    Skin: Warm & dry            Assessment & Plan:     #1 acute bronchitis w/o bronchospasm  Plan: See orders and recommendations

## 2013-10-07 ENCOUNTER — Other Ambulatory Visit: Payer: Self-pay | Admitting: Internal Medicine

## 2013-10-08 NOTE — Telephone Encounter (Signed)
Rx sent to the pharmacy by e-script.//AB/CMA 

## 2013-10-13 ENCOUNTER — Ambulatory Visit (INDEPENDENT_AMBULATORY_CARE_PROVIDER_SITE_OTHER): Payer: BC Managed Care – PPO | Admitting: Physician Assistant

## 2013-10-13 ENCOUNTER — Encounter: Payer: Self-pay | Admitting: Physician Assistant

## 2013-10-13 VITALS — BP 133/87 | HR 61 | Temp 98.3°F | Resp 16 | Ht 72.0 in | Wt 228.5 lb

## 2013-10-13 DIAGNOSIS — R21 Rash and other nonspecific skin eruption: Secondary | ICD-10-CM

## 2013-10-13 MED ORDER — CLOTRIMAZOLE-BETAMETHASONE 1-0.05 % EX CREA: 1.0000 "application " | TOPICAL_CREAM | Freq: Two times a day (BID) | CUTANEOUS | Status: DC

## 2013-10-13 NOTE — Progress Notes (Signed)
Pre visit review using our clinic review tool, if applicable. No additional management support is needed unless otherwise documented below in the visit note/SLS  

## 2013-10-13 NOTE — Assessment & Plan Note (Signed)
Unclear etiology.  Looks almost like an contact dermatitis but has central clearing and scaling like a fungal rash. Will Rx Lotrisone cream to apply daily.  Cool compresses, benadryl and Sarna for itch.  Will need referral if rash does not begin to clear with Rx steroid-antifungal cream.

## 2013-10-13 NOTE — Progress Notes (Signed)
Patient presents to clinic today c/o small rash on his right anterior forearm that has been present for ~ 2 months.  Patient states the rash is pruritic in nature.  Has tried OTC antifungals and triple antibiotic ointment without improvement in symptoms.  Endorses similar rash of lower abdomen x 2 days.  Denies rash elsewhere. Denies change in hygiene product, laundry detergent or lotions.  No one else at home with a similar rash.  Denies recent travel.  Past Medical History  Diagnosis Date  . Hyperlipidemia     NMR LDL 140(1854/1324)TG 102,HDL 33  . Arthritis     rheumatoid arthritis variant; Dr Jolee Ewing  . Hypertension     Current Outpatient Prescriptions on File Prior to Visit  Medication Sig Dispense Refill  . acetaminophen (TYLENOL) 325 MG tablet Take 650 mg by mouth as needed.        Marland Kitchen amLODipine (NORVASC) 5 MG tablet TAKE 1 TABLET (5 MG TOTAL) BY MOUTH DAILY.  90 tablet  3  . ezetimibe (ZETIA) 10 MG tablet Take 1 tablet (10 mg total) by mouth daily.  90 tablet  2  . folic acid (FOLVITE) 474 MCG tablet Take 400 mcg by mouth daily.      . methotrexate (RHEUMATREX) 2.5 MG tablet Take 2.5 mg by mouth once a week. Caution:Chemotherapy. Protect from light.       Marland Kitchen omeprazole (PRILOSEC) 20 MG capsule Take 20 mg by mouth daily. As needed, will take this or Zantac      . simvastatin (ZOCOR) 20 MG tablet Take 1 tablet (20 mg total) by mouth every evening. In place of Crestor  90 tablet  1   No current facility-administered medications on file prior to visit.    No Known Allergies  Family History  Problem Relation Age of Onset  . Arthritis Mother   . Heart disease Father 39    MI initially @ 34;died @ 94 (3rd MI)  . Heart disease Paternal Uncle 44    MI  . Arthritis Paternal Grandmother     ??RA  . Heart disease Paternal Grandfather 58    MI  . Lung cancer Maternal Grandfather   . Diabetes Neg Hx   . Stroke Neg Hx     History   Social History  . Marital Status: Married   Spouse Name: N/A    Number of Children: 2  . Years of Education: N/A   Occupational History  . 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 Smoking status: Never Smoker   . Smokeless tobacco: Never Used  . Alcohol Use: No  . Drug Use: No  . Sexual Activity: None   Other Topics Concern  . None   Social History Narrative  . None   Review of Systems - See HPI.  All other ROS are negative.  BP 133/87  Pulse 61  Temp(Src) 98.3 F (36.8 C) (Oral)  Resp 16  Ht 6' (1.829 m)  Wt 228 lb 8 oz (103.647 kg)  BMI 30.98 kg/m2  SpO2 97%  Physical Exam  Vitals reviewed. Constitutional: He is oriented to person, place, and time and well-developed, well-nourished, and in no distress.  HENT:  Head: Normocephalic and atraumatic.  Eyes: Conjunctivae are normal. Pupils are equal, round, and reactive to light.  Neck: Neck supple.  Cardiovascular: Normal rate, regular rhythm, normal heart sounds and intact distal pulses.   Pulmonary/Chest: Effort normal and breath sounds normal.  Neurological: He is alert and oriented  to person, place, and time.  Skin: Skin is warm and dry. Rash noted. Rash is maculopapular.  Rash is located on anterior left forearm.  Similar rash noted on abdomen just inferior and lateral to umbilicus.  Psychiatric: Affect normal.    Assessment/Plan: Rash and nonspecific skin eruption Unclear etiology.  Looks almost like an contact dermatitis but has central clearing and scaling like a fungal rash. Will Rx Lotrisone cream to apply daily.  Cool compresses, benadryl and Sarna for itch.  Will need referral if rash does not begin to clear with Rx steroid-antifungal cream.

## 2013-10-13 NOTE — Patient Instructions (Signed)
Please apply medication topically as directed.  You may cover rash with a dressing/bandage.  Keep the skin cool as this helps reduce itch.  You can try an over-the-counter Sarna lotion to help with itch.  Take a claritin or benadryl if needed for severe itch.  Please call if symptoms are not improving.  The number to the [facility] office is [phone]. If rash persists, we will need to send you to dermatology.

## 2013-11-11 ENCOUNTER — Encounter: Payer: Self-pay | Admitting: Internal Medicine

## 2013-11-11 ENCOUNTER — Ambulatory Visit (INDEPENDENT_AMBULATORY_CARE_PROVIDER_SITE_OTHER): Payer: BC Managed Care – PPO | Admitting: Internal Medicine

## 2013-11-11 ENCOUNTER — Other Ambulatory Visit (INDEPENDENT_AMBULATORY_CARE_PROVIDER_SITE_OTHER): Payer: BC Managed Care – PPO

## 2013-11-11 VITALS — BP 140/84 | HR 57 | Temp 97.8°F | Ht 72.0 in | Wt 226.6 lb

## 2013-11-11 DIAGNOSIS — E785 Hyperlipidemia, unspecified: Secondary | ICD-10-CM

## 2013-11-11 DIAGNOSIS — Z0000 Encounter for general adult medical examination without abnormal findings: Secondary | ICD-10-CM

## 2013-11-11 LAB — BASIC METABOLIC PANEL
BUN: 11 mg/dL (ref 6–23)
CHLORIDE: 106 meq/L (ref 96–112)
CO2: 27 meq/L (ref 19–32)
Calcium: 9.2 mg/dL (ref 8.4–10.5)
Creatinine, Ser: 1 mg/dL (ref 0.4–1.5)
GFR: 88.3 mL/min (ref >60.00)
Glucose, Bld: 100 mg/dL — ABNORMAL HIGH (ref 70–99)
Potassium: 3.7 mEq/L (ref 3.5–5.1)
SODIUM: 142 meq/L (ref 135–145)

## 2013-11-11 LAB — CBC WITH DIFFERENTIAL/PLATELET
BASOS PCT: 0.6 % (ref 0.0–3.0)
Basophils Absolute: 0 10*3/uL (ref 0.0–0.1)
EOS ABS: 0.2 10*3/uL (ref 0.0–0.7)
EOS PCT: 2.3 % (ref 0.0–5.0)
HCT: 50.3 % (ref 39.0–52.0)
HEMOGLOBIN: 16.9 g/dL (ref 13.0–17.0)
LYMPHS PCT: 36.9 % (ref 12.0–46.0)
Lymphs Abs: 2.4 10*3/uL (ref 0.7–4.0)
MCHC: 33.5 g/dL (ref 30.0–36.0)
MCV: 95.9 fl (ref 78.0–100.0)
MONO ABS: 0.4 10*3/uL (ref 0.1–1.0)
Monocytes Relative: 6.1 % (ref 3.0–12.0)
NEUTROS ABS: 3.5 10*3/uL (ref 1.4–7.7)
Neutrophils Relative %: 54.1 % (ref 43.0–77.0)
Platelets: 184 10*3/uL (ref 150.0–400.0)
RBC: 5.25 Mil/uL (ref 4.22–5.81)
RDW: 14.8 % — ABNORMAL HIGH (ref 11.5–14.6)
WBC: 6.5 10*3/uL (ref 4.5–10.5)

## 2013-11-11 LAB — HEPATIC FUNCTION PANEL
ALBUMIN: 4.8 g/dL (ref 3.5–5.2)
ALK PHOS: 51 U/L (ref 39–117)
ALT: 44 U/L (ref 0–53)
AST: 28 U/L (ref 0–37)
Bilirubin, Direct: 0.2 mg/dL (ref 0.0–0.3)
TOTAL PROTEIN: 7.3 g/dL (ref 6.0–8.3)
Total Bilirubin: 1.6 mg/dL — ABNORMAL HIGH (ref 0.3–1.2)

## 2013-11-11 LAB — LIPID PANEL
CHOL/HDL RATIO: 4
Cholesterol: 159 mg/dL (ref 0–200)
HDL: 42.2 mg/dL (ref >39.00)
LDL Cholesterol: 95 mg/dL (ref 0–99)
Triglycerides: 108 mg/dL (ref 0.0–149.0)
VLDL: 21.6 mg/dL (ref 0.0–40.0)

## 2013-11-11 LAB — TSH: TSH: 0.91 u[IU]/mL (ref 0.35–5.50)

## 2013-11-11 NOTE — Progress Notes (Signed)
Pre visit review using our clinic review tool, if applicable. No additional management support is needed unless otherwise documented below in the visit note. 

## 2013-11-11 NOTE — Progress Notes (Signed)
   Subjective:    Patient ID: Robert Velazquez, male    DOB: 07/13/1966, 48 y.o.   MRN: [id number]  HPI  He is here for a physical;acute issues chronic back issues     Review of Systems A heart healthy diet is followed; exercise encompasses 45-60 minutes 3  times per week as  Boxing,aerobics  & biking without symptoms.  Family history is strongly positive for premature coronary disease. Advanced cholesterol testing reveals  LDL goal is less than 100 ; ideally < 70 . There is medication non compliance with the statin; taken 3-4 X/week due to cost.  Low dose ASA not  taken Specifically denied are  chest pain, palpitations, dyspnea, or claudication.  Significant abdominal symptoms, memory deficit, or myalgias not present.     Objective:   Physical Exam Gen.: Healthy and well-nourished in appearance. Alert, appropriate and cooperative throughout exam.  Head: Normocephalic without obvious abnormalities; pattern alopecia  Eyes: No corneal or conjunctival inflammation noted. Pupils equal round reactive to light and accommodation. Extraocular motion intact. Ears: External  ear exam reveals no significant lesions or deformities. Canals clear .TMs normal. Hearing is grossly normal bilaterally. Nose: External nasal exam reveals no deformity or inflammation. Nasal mucosa are pink and moist. No lesions or exudates noted.   Mouth: Oral mucosa and oropharynx reveal no lesions or exudates. Teeth in good repair. Neck: No deformities, masses, or tenderness noted. Range of motion & Thyroid normal. Lungs: Normal respiratory effort; chest expands symmetrically. Lungs are clear to auscultation without rales, wheezes, or increased work of breathing. Heart: Normal rate and rhythm. Normal S1 and S2. No gallop, click, or rub. No murmur. Abdomen: Bowel sounds normal; abdomen soft and nontender. No masses, organomegaly or hernias noted. Genitalia: Genitalia normal except for left varices. Prostate is normal without  enlargement, asymmetry, nodularity, or induration                                  Musculoskeletal/extremities: No deformity or scoliosis noted of  the thoracic or lumbar spine.   No clubbing, cyanosis, edema, or significant extremity  deformity noted. Range of motion normal .Tone & strength normal. Hand joints normal.  Fingernail health good. Able to lie down & sit up w/o help. Negative SLR bilaterally Vascular: Carotid, radial artery, dorsalis pedis and  posterior tibial pulses are full and equal. No bruits present. Neurologic: Alert and oriented x3. Deep tendon reflexes symmetrical and normal.  Gait normal . Skin: Intact without suspicious lesions or rashes. Lymph: No cervical, axillary, or inguinal lymphadenopathy present. Psych: Mood and affect are normal. Normally interactive                                                                                        Assessment & Plan:  #1 comprehensive physical exam; no acute findings  Plan: see Orders  & Recommendations

## 2013-11-11 NOTE — Patient Instructions (Signed)
Your next office appointment will be determined based upon review of your pending labs. Those instructions will be transmitted to you through My Chart .  It is imperative that your LDL be less than 100, ideally less than 70 in view of the advanced cholesterol testing results which reveal increased risk above these levels and your incredibly strong family history of premature coronary disease in the paternal family. 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 These high-risk factors should be discussed with your Pharmacist and managed care company.

## 2013-11-12 ENCOUNTER — Other Ambulatory Visit: Payer: Self-pay | Admitting: Internal Medicine

## 2013-11-12 ENCOUNTER — Telehealth: Payer: Self-pay | Admitting: *Deleted

## 2013-11-12 DIAGNOSIS — E785 Hyperlipidemia, unspecified: Secondary | ICD-10-CM

## 2013-11-12 MED ORDER — ROSUVASTATIN CALCIUM 10 MG PO TABS: 10.0000 mg | ORAL_TABLET | Freq: Every day | ORAL | Status: DC

## 2013-11-12 NOTE — Telephone Encounter (Signed)
Rx sent to [facility] for Crestor 10 mg ( equivalent to 20 mg Simvastatin)

## 2013-11-12 NOTE — Telephone Encounter (Signed)
Pt called states Crestor is covered by insurance.  Please advise

## 2013-12-12 ENCOUNTER — Telehealth: Payer: Self-pay | Admitting: Internal Medicine

## 2013-12-12 NOTE — Telephone Encounter (Signed)
Called patient to inform him what was stated below by Dr Linna Darner and patient voiced he understood.

## 2013-12-12 NOTE — Telephone Encounter (Signed)
Caller name:Adrin Nudelman Relation to VP:[id number] Call back Paisley:  Reason for call: to see what blood type he is. Please advise

## 2013-12-12 NOTE — Telephone Encounter (Signed)
   Formerly it was critical to know one's blood type as blood typing was complicated & slow .Now the crossmatch can be done almost instantaneously and universal blood products are available. If you do wish to know your blood type for some reason; you can donate blood to the [employer] and they will give you this information.

## 2014-01-13 ENCOUNTER — Encounter: Payer: Self-pay | Admitting: Internal Medicine

## 2014-01-20 ENCOUNTER — Encounter: Payer: Self-pay | Admitting: Internal Medicine

## 2014-03-05 ENCOUNTER — Encounter: Payer: Self-pay | Admitting: Physician Assistant

## 2014-03-05 ENCOUNTER — Ambulatory Visit (INDEPENDENT_AMBULATORY_CARE_PROVIDER_SITE_OTHER): Payer: BC Managed Care – PPO | Admitting: Physician Assistant

## 2014-03-05 VITALS — BP 116/74 | HR 68 | Temp 98.1°F | Resp 16 | Ht 72.0 in | Wt 227.2 lb

## 2014-03-05 DIAGNOSIS — H6123 Impacted cerumen, bilateral: Secondary | ICD-10-CM

## 2014-03-05 DIAGNOSIS — H6120 Impacted cerumen, unspecified ear: Secondary | ICD-10-CM

## 2014-03-05 NOTE — Progress Notes (Signed)
Pre visit review using our clinic review tool, if applicable. No additional management support is needed unless otherwise documented below in the visit note/SLS  

## 2014-03-05 NOTE — Patient Instructions (Signed)
Cerumen Impaction °A cerumen impaction is when the wax in your ear forms a plug. This plug usually causes reduced hearing. Sometimes it also causes an earache or dizziness. Removing a cerumen impaction can be difficult and painful. The wax sticks to the ear canal. The canal is sensitive and bleeds easily. If you try to remove a heavy wax buildup with a cotton tipped swab, you may push it in further. °Irrigation with water, suction, and small ear curettes may be used to clear out the wax. If the impaction is fixed to the skin in the ear canal, ear drops may be needed for a few days to loosen the wax. People who build up a lot of wax frequently can use ear wax removal products available in your local drugstore. °SEEK MEDICAL CARE IF:  °You develop an earache, increased hearing loss, or marked dizziness. °Document Released: 09/07/2004 Document Revised: 10/23/2011 Document Reviewed: 10/28/2009 °[employer]® Patient Information ©2015 [employer]. This information is not intended to replace advice given to you by your health care provider. Make sure you discuss any questions you have with your health care provider. ° °

## 2014-03-06 NOTE — Assessment & Plan Note (Signed)
Removed via irrigation. Full symptom improvement after irrigation.  Repeat examination within normal limits.

## 2014-03-06 NOTE — Progress Notes (Signed)
Patient presents to clinic today c/o decreased hearing and fullness of bilateral ears over the past month, worsening over the past week.  Denies ear pain or tinnitus.  Denies ear drainage.  Has history of cerumen impaction.  Denies URI symptoms. Denies dizziness.   Past Medical History  Diagnosis Date  . Hyperlipidemia     NMR LDL 140(1854/1324)TG 102,HDL 33  . Arthritis     rheumatoid arthritis variant; Dr Jolee Ewing  . Hypertension     Current Outpatient Prescriptions on File Prior to Visit  Medication Sig Dispense Refill  . acetaminophen (TYLENOL) 325 MG tablet Take 650 mg by mouth as needed.        Marland Kitchen amLODipine (NORVASC) 5 MG tablet TAKE 1 TABLET (5 MG TOTAL) BY MOUTH DAILY.  90 tablet  3  . folic acid (FOLVITE) 625 MCG tablet Take 400 mcg by mouth daily.      . methotrexate (RHEUMATREX) 2.5 MG tablet Take 2.5 mg by mouth once a week. Caution:Chemotherapy. Protect from light.       Marland Kitchen omeprazole (PRILOSEC) 20 MG capsule Take 20 mg by mouth daily. As needed, will take this or Zantac      . rosuvastatin (CRESTOR) 10 MG tablet Take 1 tablet (10 mg total) by mouth daily.  90 tablet  3   No current facility-administered medications on file prior to visit.    No Known Allergies  Family History  Problem Relation Age of Onset  . Arthritis Mother     ? type, ? not RA  . Heart attack Father 9    MI ,died @ 67 (51rd MI)  . Heart attack Paternal Uncle 41    MI  . Arthritis Paternal Grandmother     ??RA  . Heart attack Paternal Grandfather 21    MI  . Lung cancer Maternal Grandfather     smoker  . Diabetes Neg Hx   . Stroke Neg Hx     History   Social History  . Marital Status: Married    Spouse Name: N/A    Number of Children: 2  . Years of Education: N/A   Occupational History  . 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 Smoking status: Never Smoker   . Smokeless tobacco: Never Used  . Alcohol Use: No  . Drug Use: No  . Sexual Activity: None   Other  Topics Concern  . None   Social History Narrative  . None   Review of Systems - See HPI.  All other ROS are negative.  BP 116/74  Pulse 68  Temp(Src) 98.1 F (36.7 C) (Oral)  Resp 16  Ht 6' (1.829 m)  Wt 227 lb 4 oz (103.08 kg)  BMI 30.81 kg/m2  SpO2 97%  Physical Exam  Vitals reviewed. Constitutional: He is oriented to person, place, and time and well-developed, well-nourished, and in no distress.  HENT:  Head: Normocephalic and atraumatic.  Nose: Nose normal.  Mouth/Throat: Uvula is midline, oropharynx is clear and moist and mucous membranes are normal.  Presence of significant cerumen impaction bilaterally.  Once impaction removed from ear canals, canals and TMs within normal limits bilaterally.   Eyes: Conjunctivae are normal.  Neck: Neck supple.  Cardiovascular: Normal rate, regular rhythm, normal heart sounds and intact distal pulses.   Pulmonary/Chest: Effort normal and breath sounds normal. No respiratory distress. He has no wheezes. He has no rales. He exhibits no tenderness.  Neurological: He is alert and oriented to  person, place, and time.  Skin: Skin is warm and dry. No rash noted.   Assessment/Plan: Cerumen impaction Removed via irrigation. Full symptom improvement after irrigation.  Repeat examination within normal limits.

## 2014-07-23 ENCOUNTER — Other Ambulatory Visit: Payer: Self-pay | Admitting: Rheumatology

## 2014-07-23 DIAGNOSIS — M5410 Radiculopathy, site unspecified: Secondary | ICD-10-CM

## 2014-07-31 ENCOUNTER — Ambulatory Visit
Admission: RE | Admit: 2014-07-31 | Discharge: 2014-07-31 | Disposition: A | Discharge status: Home / Self Care | Payer: BC Managed Care – PPO | Source: Ambulatory Visit | Attending: Rheumatology | Admitting: Rheumatology

## 2014-07-31 DIAGNOSIS — M5410 Radiculopathy, site unspecified: Secondary | ICD-10-CM

## 2014-09-24 ENCOUNTER — Ambulatory Visit (INDEPENDENT_AMBULATORY_CARE_PROVIDER_SITE_OTHER): Payer: BLUE CROSS/BLUE SHIELD | Admitting: Family Medicine

## 2014-09-24 ENCOUNTER — Encounter: Payer: Self-pay | Admitting: Family Medicine

## 2014-09-24 VITALS — BP 120/78 | HR 61 | Temp 97.8°F | Wt 233.4 lb

## 2014-09-24 DIAGNOSIS — J0110 Acute frontal sinusitis, unspecified: Secondary | ICD-10-CM

## 2014-09-24 MED ORDER — FLUTICASONE PROPIONATE 50 MCG/ACT NA SUSP: 2.0000 | Freq: Every day | NASAL | Status: DC

## 2014-09-24 MED ORDER — AMOXICILLIN-POT CLAVULANATE 875-125 MG PO TABS: 1.0000 | ORAL_TABLET | Freq: Two times a day (BID) | ORAL | Status: DC

## 2014-09-24 NOTE — Progress Notes (Signed)
  Subjective:     Robert Velazquez is a 49 y.o. male who presents for evaluation of symptoms of a URI. Symptoms include congestion, facial pain, nasal congestion, post nasal drip and sinus pressure. Onset of symptoms was 2 weeks ago, and has been gradually worsening since that time. Treatment to date: antihistamines and cough suppressants.  The following portions of the patient's history were reviewed and updated as appropriate:  He  has a past medical history of Hyperlipidemia; Arthritis; and Hypertension. He  does not have any pertinent problems on file. He  has past surgical history that includes Knee surgery; Mouth surgery; Tonsillectomy; and Epidural block injection (2011-2014). His family history includes Arthritis in his mother and paternal grandmother; Heart attack (age of onset: 68) in his father; Heart attack (age of onset: 70) in his paternal grandfather and paternal uncle; Lung cancer in his maternal grandfather. There is no history of Diabetes or Stroke. He  reports that he has never smoked. He has never used smokeless tobacco. He reports that he does not drink alcohol or use illicit drugs. He has a current medication list which includes the following prescription(s): acetaminophen, amlodipine, folic acid, methotrexate, omeprazole, rosuvastatin, amoxicillin-clavulanate, and fluticasone. Current Outpatient Prescriptions on File Prior to Visit  Medication Sig Dispense Refill  . acetaminophen (TYLENOL) 325 MG tablet Take 650 mg by mouth as needed.      Marland Kitchen amLODipine (NORVASC) 5 MG tablet TAKE 1 TABLET (5 MG TOTAL) BY MOUTH DAILY. 90 tablet 3  . folic acid (FOLVITE) 254 MCG tablet Take 400 mcg by mouth daily.    . methotrexate (RHEUMATREX) 2.5 MG tablet Take 2.5 mg by mouth once a week. Caution:Chemotherapy. Protect from light.     Marland Kitchen omeprazole (PRILOSEC) 20 MG capsule Take 20 mg by mouth daily. As needed, will take this or Zantac    . rosuvastatin (CRESTOR) 10 MG tablet Take 1 tablet (10 mg  total) by mouth daily. 90 tablet 3   No current facility-administered medications on file prior to visit.   He has No Known Allergies..  Review of Systems Pertinent items are noted in HPI.   Objective:    BP 120/78 mmHg  Pulse 61  Temp(Src) 97.8 F (36.6 C) (Oral)  Wt 233 lb 6.4 oz (105.87 kg)  SpO2 97% General appearance: alert, cooperative, appears stated age and no distress Ears: normal TM's and external ear canals both ears Nose: green discharge, moderate congestion, turbinates red, swollen, sinus tenderness bilateral Throat: lips, mucosa, and tongue normal; teeth and gums normal Neck: moderate anterior cervical adenopathy, supple, symmetrical, trachea midline and thyroid not enlarged, symmetric, no tenderness/mass/nodules Lungs: clear to auscultation bilaterally Heart: S1, S2 normal   Assessment:    sinusitis   Plan:    Discussed the diagnosis and treatment of sinusitis. Suggested symptomatic OTC remedies. Nasal saline spray for congestion. Augmentin per orders. Nasal steroids per orders. Follow up as needed.

## 2014-09-24 NOTE — Patient Instructions (Signed)

## 2014-09-24 NOTE — Progress Notes (Signed)
Pre visit review using our clinic review tool, if applicable. No additional management support is needed unless otherwise documented below in the visit note. 

## 2014-10-22 ENCOUNTER — Telehealth: Payer: Self-pay | Admitting: Internal Medicine

## 2014-10-22 NOTE — Telephone Encounter (Signed)
error 

## 2014-11-19 ENCOUNTER — Ambulatory Visit: Payer: BC Managed Care – PPO | Admitting: Internal Medicine

## 2014-11-20 ENCOUNTER — Encounter: Payer: Self-pay | Admitting: Physician Assistant

## 2014-11-20 ENCOUNTER — Encounter: Payer: Self-pay | Admitting: Internal Medicine

## 2014-11-20 ENCOUNTER — Ambulatory Visit (INDEPENDENT_AMBULATORY_CARE_PROVIDER_SITE_OTHER): Payer: BLUE CROSS/BLUE SHIELD | Admitting: Physician Assistant

## 2014-11-20 VITALS — BP 132/90 | HR 65 | Temp 97.9°F | Resp 16 | Ht 72.0 in | Wt 229.2 lb

## 2014-11-20 DIAGNOSIS — Z0000 Encounter for general adult medical examination without abnormal findings: Secondary | ICD-10-CM | POA: Diagnosis not present

## 2014-11-20 DIAGNOSIS — I10 Essential (primary) hypertension: Secondary | ICD-10-CM | POA: Diagnosis not present

## 2014-11-20 DIAGNOSIS — Z1211 Encounter for screening for malignant neoplasm of colon: Secondary | ICD-10-CM | POA: Diagnosis not present

## 2014-11-20 DIAGNOSIS — Z8249 Family history of ischemic heart disease and other diseases of the circulatory system: Secondary | ICD-10-CM | POA: Diagnosis not present

## 2014-11-20 DIAGNOSIS — E785 Hyperlipidemia, unspecified: Secondary | ICD-10-CM

## 2014-11-20 DIAGNOSIS — M069 Rheumatoid arthritis, unspecified: Secondary | ICD-10-CM

## 2014-11-20 HISTORY — DX: Family history of ischemic heart disease and other diseases of the circulatory system: Z82.49

## 2014-11-20 HISTORY — DX: Encounter for general adult medical examination without abnormal findings: Z00.00

## 2014-11-20 LAB — CBC
HEMATOCRIT: 49.8 % (ref 39.0–52.0)
HEMOGLOBIN: 16.9 g/dL (ref 13.0–17.0)
MCHC: 33.8 g/dL (ref 30.0–36.0)
MCV: 93.2 fl (ref 78.0–100.0)
Platelets: 178 10*3/uL (ref 150.0–400.0)
RBC: 5.35 Mil/uL (ref 4.22–5.81)
RDW: 14.2 % (ref 11.5–15.5)
WBC: 5.9 10*3/uL (ref 4.0–10.5)

## 2014-11-20 LAB — COMPREHENSIVE METABOLIC PANEL
ALBUMIN: 4.2 g/dL (ref 3.5–5.2)
ALT: 30 U/L (ref 0–53)
AST: 19 U/L (ref 0–37)
Alkaline Phosphatase: 59 U/L (ref 39–117)
BUN: 14 mg/dL (ref 6–23)
CHLORIDE: 107 meq/L (ref 96–112)
CO2: 29 meq/L (ref 19–32)
Calcium: 9.3 mg/dL (ref 8.4–10.5)
Creatinine, Ser: 0.98 mg/dL (ref 0.40–1.50)
GFR: 85.87 mL/min (ref >60.00)
GLUCOSE: 116 mg/dL — AB (ref 70–99)
POTASSIUM: 4.5 meq/L (ref 3.5–5.1)
SODIUM: 141 meq/L (ref 135–145)
TOTAL PROTEIN: 6.4 g/dL (ref 6.0–8.3)
Total Bilirubin: 1 mg/dL (ref 0.2–1.2)

## 2014-11-20 LAB — URINALYSIS, ROUTINE W REFLEX MICROSCOPIC
Bilirubin Urine: NEGATIVE
HGB URINE DIPSTICK: NEGATIVE
Ketones, ur: NEGATIVE
Leukocytes, UA: NEGATIVE
NITRITE: NEGATIVE
PH: 6.5 (ref 5.0–8.0)
RBC / HPF: NONE SEEN (ref 0–?)
Specific Gravity, Urine: 1.015 (ref 1.000–1.030)
TOTAL PROTEIN, URINE-UPE24: NEGATIVE
URINE GLUCOSE: NEGATIVE
Urobilinogen, UA: 0.2 (ref 0.0–1.0)

## 2014-11-20 LAB — LIPID PANEL
CHOLESTEROL: 137 mg/dL (ref 0–200)
HDL: 33.6 mg/dL — ABNORMAL LOW (ref >39.00)
LDL CALC: 76 mg/dL (ref 0–99)
NonHDL: 103.4
TRIGLYCERIDES: 139 mg/dL (ref 0.0–149.0)
Total CHOL/HDL Ratio: 4
VLDL: 27.8 mg/dL (ref 0.0–40.0)

## 2014-11-20 LAB — PSA: PSA: 0.51 ng/mL (ref 0.10–4.00)

## 2014-11-20 LAB — TSH: TSH: 1.05 u[IU]/mL (ref 0.35–4.50)

## 2014-11-20 LAB — HEMOGLOBIN A1C: HEMOGLOBIN A1C: 5.4 % (ref 4.6–6.5)

## 2014-11-20 MED ORDER — AMLODIPINE BESYLATE 5 MG PO TABS: ORAL_TABLET | ORAL | Status: DC

## 2014-11-20 NOTE — Assessment & Plan Note (Signed)
Well-controlled. Asymptomatic. Continue current regimen. Medications refilled. Will obtain fasting labs at today's visit.

## 2014-11-20 NOTE — Assessment & Plan Note (Signed)
EKG reveals sinus bradycardia. Asymptomatic. Continue current regimen. Will obtain fasting lipid panel today.

## 2014-11-20 NOTE — Assessment & Plan Note (Signed)
Tolerating statin without side effects. Will obtain fasting lipid panel today

## 2014-11-20 NOTE — Progress Notes (Signed)
Patient presents to clinic today to transfer care from previous PCP, Dr. Linna Darner, who is retiring. Patient is requesting complete physical examination. Is fasting for labs.   Acute Concerns: None at present.  Chronic Issues: Hypertension -- patient with history of hypertension, currently controlled with amlodipine 5 mg daily. Patient denies chest pain, palpitations, lightheadedness, dizziness, vision changes or frequent headaches.  Hyperlipidemia -- patient endorses previous history of elevated cholesterol. Is currently on Crestor 10 mg daily.  Denies myalgias.  Rheumatoid arthritis -- Currently on methotrexate oral Once weekly. Endorses good relief of symptoms. Is followed by Dr. Norva Riffle.  Health Maintenance: Dental -- up-to-date Vision -- up-to-date Immunizations -- up-to-date Colonoscopy --  is now 59 and due for colonoscopy.  Is willing to proceed with colonoscopy.  Past Medical History  Diagnosis Date  . Hyperlipidemia     NMR LDL 140(1854/1324)TG 102,HDL 33  . Arthritis     rheumatoid arthritis variant; Dr Jolee Ewing  . Hypertension   . History of chicken pox     Past Surgical History  Procedure Laterality Date  . Knee surgery      arthoscopic x2  . Mouth surgery      wisdom teeth & tooth removed from palate & re-implated  . Tonsillectomy    . Epidural block injection  2011-2014    ? 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 acetaminophen (TYLENOL) 325 MG tablet Take 650 mg by mouth as needed.      . fluticasone (FLONASE) 50 MCG/ACT nasal spray Place 2 sprays into both nostrils daily. (Patient taking differently: Place 2 sprays into both nostrils daily as needed. ) 16 g 6  . folic acid (FOLVITE) 222 MCG tablet Take 400 mcg by mouth daily.    . methotrexate (RHEUMATREX) 2.5 MG tablet Take 2.5 mg by mouth once a week. Caution:Chemotherapy. Protect from light.     Marland Kitchen omeprazole  (PRILOSEC) 20 MG capsule Take 20 mg by mouth daily. As needed, will take this or Zantac    . rosuvastatin (CRESTOR) 10 MG tablet Take 1 tablet (10 mg total) by mouth daily. 90 tablet 3   No current facility-administered medications on file prior to visit.    No Known Allergies  Family History  Problem Relation Age of Onset  . Arthritis Mother     ? type, ? not RA - Living  . Heart attack Father 51    MI ,died @ 72 (47rd MI)  . Heart attack Paternal Uncle 23    MI  . Arthritis Paternal Grandmother     ??RA  . Heart attack Paternal Grandfather 34    MI  . Lung cancer Maternal Grandfather     smoker  . Diabetes Neg Hx   . Stroke Neg Hx   . Heart attack Paternal Uncle   . Healthy Sister   . Asthma Son   . Allergies Son   . Healthy Son     #2    History   Social History  . Marital Status: Married    Spouse Name: N/A  . Number of Children: 2  . Years of Education: N/A   Occupational History  . 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 Smoking status: Never Smoker   . Smokeless tobacco: Never Used  . Alcohol Use: No  . Drug Use: No  . Sexual Activity: Not on  file   Other Topics Concern  . Not on file   Social History Narrative   Review of Systems  Constitutional: Negative for fever and weight loss.  HENT: Negative for ear discharge, ear pain, hearing loss and tinnitus.   Eyes: Negative for blurred vision, double vision, photophobia and pain.  Respiratory: Negative for cough and shortness of breath.   Cardiovascular: Negative for chest pain and palpitations.  Gastrointestinal: Negative for heartburn, nausea, vomiting, abdominal pain, diarrhea, constipation, blood in stool and melena.  Genitourinary: Negative for dysuria, urgency, frequency, hematuria and flank pain.  Musculoskeletal: Negative for falls.  Neurological: Negative for dizziness, loss of consciousness and headaches.  Endo/Heme/Allergies: Negative for environmental allergies.    Psychiatric/Behavioral: Negative for depression, suicidal ideas, hallucinations and substance abuse. The patient is not nervous/anxious and does not have insomnia.    BP 132/90 mmHg  Pulse 65  Temp(Src) 97.9 F (36.6 C) (Oral)  Resp 16  Ht 6' (1.829 m)  Wt 229 lb 4 oz (103.987 kg)  BMI 31.09 kg/m2  SpO2 98%  Physical Exam  Constitutional: He is oriented to person, place, and time and well-developed, well-nourished, and in no distress.  HENT:  Head: Normocephalic and atraumatic.  Right Ear: External ear normal.  Left Ear: External ear normal.  Nose: Nose normal.  Mouth/Throat: Oropharynx is clear and moist. No oropharyngeal exudate.  Eyes: Conjunctivae and EOM are normal. Pupils are equal, round, and reactive to light.  Neck: Neck supple. No thyromegaly present.  Cardiovascular: Normal rate, regular rhythm, normal heart sounds and intact distal pulses.   Pulmonary/Chest: Effort normal and breath sounds normal. No respiratory distress. He has no wheezes. He has no rales. He exhibits no tenderness.  Abdominal: Soft. Bowel sounds are normal. He exhibits no distension and no mass. There is no tenderness. There is no rebound and no guarding.  Genitourinary: Testes/scrotum normal.  Patient defers DRE  Lymphadenopathy:    He has no cervical adenopathy.  Neurological: He is alert and oriented to person, place, and time.  Skin: Skin is warm and dry. No rash noted.  Psychiatric: Affect normal.  Vitals reviewed.   No results found for this or any previous visit (from the past 2160 hour(s)).  Assessment/Plan: Essential hypertension Well-controlled. Asymptomatic. Continue current regimen. Medications refilled. Will obtain fasting labs at today's visit.   Rheumatoid arthritis Followed by rheumatology. Well controlled with current regimen of methotrexate. Follow-up with specialist as scheduled.   Colon cancer screening Referral placed to GI for screening colonoscopy.   Family  history of early CAD EKG reveals sinus bradycardia. Asymptomatic. Continue current regimen. Will obtain fasting lipid panel today.   Hyperlipidemia Tolerating statin without side effects. Will obtain fasting lipid panel today   Visit for preventive health examination Health maintenance up-to-date with the exception of colonoscopy. Declines HIV screening. Depression screen negative. Preventative care discussed with patient. Handout given. Will obtain fasting labs today.

## 2014-11-20 NOTE — Assessment & Plan Note (Signed)
Health maintenance up-to-date with the exception of colonoscopy. Declines HIV screening. Depression screen negative. Preventative care discussed with patient. Handout given. Will obtain fasting labs today.

## 2014-11-20 NOTE — Progress Notes (Signed)
Pre visit review using our clinic review tool, if applicable. No additional management support is needed unless otherwise documented below in the visit note/SLS  

## 2014-11-20 NOTE — Assessment & Plan Note (Signed)
Referral placed to GI for screening colonoscopy 

## 2014-11-20 NOTE — Patient Instructions (Signed)
Please stop by the lab for blood work. I will call you with your results. Continue your medications as directed.  You will be contacted by Gastroenterology for a consult for Colonoscopy.  Preventive Care for Adults A healthy lifestyle and preventive care can promote health and wellness. Preventive health guidelines for men include the following key practices:  A routine yearly physical is a good way to check with your health care provider about your health and preventative screening. It is a chance to share any concerns and updates on your health and to receive a thorough exam.  Visit your dentist for a routine exam and preventative care every 6 months. Brush your teeth twice a day and floss once a day. Good oral hygiene prevents tooth decay and gum disease.  The frequency of eye exams is based on your age, health, family medical history, use of contact lenses, and other factors. Follow your health care provider's recommendations for frequency of eye exams.  Eat a healthy diet. Foods such as vegetables, fruits, whole grains, low-fat dairy products, and lean protein foods contain the nutrients you need without too many calories. Decrease your intake of foods high in solid fats, added sugars, and salt. Eat the right amount of calories for you.Get information about a proper diet from your health care provider, if necessary.  Regular physical exercise is one of the most important things you can do for your health. Most adults should get at least 150 minutes of moderate-intensity exercise (any activity that increases your heart rate and causes you to sweat) each week. In addition, most adults need muscle-strengthening exercises on 2 or more days a week.  Maintain a healthy weight. The body mass index (BMI) is a screening tool to identify possible weight problems. It provides an estimate of body fat based on height and weight. Your health care provider can find your BMI and can help you achieve or  maintain a healthy weight.For adults 20 years and older:  A BMI below 18.5 is considered underweight.  A BMI of 18.5 to 24.9 is normal.  A BMI of 25 to 29.9 is considered overweight.  A BMI of 30 and above is considered obese.  Maintain normal blood lipids and cholesterol levels by exercising and minimizing your intake of saturated fat. Eat a balanced diet with plenty of fruit and vegetables. Blood tests for lipids and cholesterol should begin at age 5 and be repeated every 5 years. If your lipid or cholesterol levels are high, you are over 50, or you are at high risk for heart disease, you may need your cholesterol levels checked more frequently.Ongoing high lipid and cholesterol levels should be treated with medicines if diet and exercise are not working.  If you smoke, find out from your health care provider how to quit. If you do not use tobacco, do not start.  Lung cancer screening is recommended for adults aged 71-80 years who are at high risk for developing lung cancer because of a history of smoking. A yearly low-dose CT scan of the lungs is recommended for people who have at least a 30-pack-year history of smoking and are a current smoker or have quit within the past 15 years. A pack year of smoking is smoking an average of 1 pack of cigarettes a day for 1 year (for example: 1 pack a day for 30 years or 2 packs a day for 15 years). Yearly screening should continue until the smoker has stopped smoking for at least 15 years.  Yearly screening should be stopped for people who develop a health problem that would prevent them from having lung cancer treatment.  If you choose to drink alcohol, do not have more than 2 drinks per day. One drink is considered to be 12 ounces (355 mL) of beer, 5 ounces (148 mL) of wine, or 1.5 ounces (44 mL) of liquor.  Avoid use of street drugs. Do not share needles with anyone. Ask for help if you need support or instructions about stopping the use of  drugs.  High blood pressure causes heart disease and increases the risk of stroke. Your blood pressure should be checked at least every 1-2 years. Ongoing high blood pressure should be treated with medicines, if weight loss and exercise are not effective.  If you are 71-25 years old, ask your health care provider if you should take aspirin to prevent heart disease.  Diabetes screening involves taking a blood sample to check your fasting blood sugar level. This should be done once every 3 years, after age 79, if you are within normal weight and without risk factors for diabetes. Testing should be considered at a younger age or be carried out more frequently if you are overweight and have at least 1 risk factor for diabetes.  Colorectal cancer can be detected and often prevented. Most routine colorectal cancer screening begins at the age of 31 and continues through age 49. However, your health care provider may recommend screening at an earlier age if you have risk factors for colon cancer. On a yearly basis, your health care provider may provide home test kits to check for hidden blood in the stool. Use of a small camera at the end of a tube to directly examine the colon (sigmoidoscopy or colonoscopy) can detect the earliest forms of colorectal cancer. Talk to your health care provider about this at age 67, when routine screening begins. Direct exam of the colon should be repeated every 5-10 years through age 75, unless early forms of precancerous polyps or small growths are found.  People who are at an increased risk for hepatitis B should be screened for this virus. You are considered at high risk for hepatitis B if:  You were born in a country where hepatitis B occurs often. Talk with your health care provider about which countries are considered high risk.  Your parents were born in a high-risk country and you have not received a shot to protect against hepatitis B (hepatitis B vaccine).  You have  HIV or AIDS.  You use needles to inject street drugs.  You live with, or have sex with, someone who has hepatitis B.  You are a man who has sex with other men (MSM).  You get hemodialysis treatment.  You take certain medicines for conditions such as cancer, organ transplantation, and autoimmune conditions.  Hepatitis C blood testing is recommended for all people born from [birth year] through 1965 and any individual with known risks for hepatitis C.  Practice safe sex. Use condoms and avoid high-risk sexual practices to reduce the spread of sexually transmitted infections (STIs). STIs include gonorrhea, chlamydia, syphilis, trichomonas, herpes, HPV, and human immunodeficiency virus (HIV). Herpes, HIV, and HPV are viral illnesses that have no cure. They can result in disability, cancer, and death.  If you are at risk of being infected with HIV, it is recommended that you take a prescription medicine daily to prevent HIV infection. This is called preexposure prophylaxis (PrEP). You are considered at risk if:  You are a man who has sex with other men (MSM) and have other risk factors.  You are a heterosexual man, are sexually active, and are at increased risk for HIV infection.  You take drugs by injection.  You are sexually active with a partner who has HIV.  Talk with your health care provider about whether you are at high risk of being infected with HIV. If you choose to begin PrEP, you should first be tested for HIV. You should then be tested every 3 months for as long as you are taking PrEP.  A one-time screening for abdominal aortic aneurysm (AAA) and surgical repair of large AAAs by ultrasound are recommended for men ages 66 to 61 years who are current or former smokers.  Healthy men should no longer receive prostate-specific antigen (PSA) blood tests as part of routine cancer screening. Talk with your health care provider about prostate cancer screening.  Testicular cancer screening is  not recommended for adult males who have no symptoms. Screening includes self-exam, a health care provider exam, and other screening tests. Consult with your health care provider about any symptoms you have or any concerns you have about testicular cancer.  Use sunscreen. Apply sunscreen liberally and repeatedly throughout the day. You should seek shade when your shadow is shorter than you. Protect yourself by wearing long sleeves, pants, a wide-brimmed hat, and sunglasses year round, whenever you are outdoors.  Once a month, do a whole-body skin exam, using a mirror to look at the skin on your back. Tell your health care provider about new moles, moles that have irregular borders, moles that are larger than a pencil eraser, or moles that have changed in shape or color.  Stay current with required vaccines (immunizations).  Influenza vaccine. All adults should be immunized every year.  Tetanus, diphtheria, and acellular pertussis (Td, Tdap) vaccine. An adult who has not previously received Tdap or who does not know his vaccine status should receive 1 dose of Tdap. This initial dose should be followed by tetanus and diphtheria toxoids (Td) booster doses every 10 years. Adults with an unknown or incomplete history of completing a 3-dose immunization series with Td-containing vaccines should begin or complete a primary immunization series including a Tdap dose. Adults should receive a Td booster every 10 years.  Varicella vaccine. An adult without evidence of immunity to varicella should receive 2 doses or a second dose if he has previously received 1 dose.  Human papillomavirus (HPV) vaccine. Males aged 54-21 years who have not received the vaccine previously should receive the 3-dose series. Males aged 22-26 years may be immunized. Immunization is recommended through the age of 7 years for any male who has sex with males and did not get any or all doses earlier. Immunization is recommended for any  person with an immunocompromised condition through the age of 17 years if he did not get any or all doses earlier. During the 3-dose series, the second dose should be obtained 4-8 weeks after the first dose. The third dose should be obtained 24 weeks after the first dose and 16 weeks after the second dose.  Zoster vaccine. One dose is recommended for adults aged 37 years or older unless certain conditions are present.  Measles, mumps, and rubella (MMR) vaccine. Adults born before [birth year] generally are considered immune to measles and mumps. Adults born in [birth year] or later should have 1 or more doses of MMR vaccine unless there is a contraindication to the vaccine or  there is laboratory evidence of immunity to each of the three diseases. A routine second dose of MMR vaccine should be obtained at least 28 days after the first dose for students attending postsecondary schools, health care workers, or international travelers. People who received inactivated measles vaccine or an unknown type of measles vaccine during 1963-1967 should receive 2 doses of MMR vaccine. People who received inactivated mumps vaccine or an unknown type of mumps vaccine before 1979 and are at high risk for mumps infection should consider immunization with 2 doses of MMR vaccine. Unvaccinated health care workers born before [birth year] who lack laboratory evidence of measles, mumps, or rubella immunity or laboratory confirmation of disease should consider measles and mumps immunization with 2 doses of MMR vaccine or rubella immunization with 1 dose of MMR vaccine.  Pneumococcal 13-valent conjugate (PCV13) vaccine. When indicated, a person who is uncertain of his immunization history and has no record of immunization should receive the PCV13 vaccine. An adult aged 67 years or older who has certain medical conditions and has not been previously immunized should receive 1 dose of PCV13 vaccine. This PCV13 should be followed with a dose of pneumococcal  polysaccharide (PPSV23) vaccine. The PPSV23 vaccine dose should be obtained at least 8 weeks after the dose of PCV13 vaccine. An adult aged 17 years or older who has certain medical conditions and previously received 1 or more doses of PPSV23 vaccine should receive 1 dose of PCV13. The PCV13 vaccine dose should be obtained 1 or more years after the last PPSV23 vaccine dose.  Pneumococcal polysaccharide (PPSV23) vaccine. When PCV13 is also indicated, PCV13 should be obtained first. All adults aged 7 years and older should be immunized. An adult younger than age 64 years who has certain medical conditions should be immunized. Any person who resides in a nursing home or long-term care facility should be immunized. An adult smoker should be immunized. People with an immunocompromised condition and certain other conditions should receive both PCV13 and PPSV23 vaccines. People with human immunodeficiency virus (HIV) infection should be immunized as soon as possible after diagnosis. Immunization during chemotherapy or radiation therapy should be avoided. Routine use of PPSV23 vaccine is not recommended for American Indians, [place] Natives, or people younger than 65 years unless there are medical conditions that require PPSV23 vaccine. When indicated, people who have unknown immunization and have no record of immunization should receive PPSV23 vaccine. One-time revaccination 5 years after the first dose of PPSV23 is recommended for people aged 19-64 years who have chronic kidney failure, nephrotic syndrome, asplenia, or immunocompromised conditions. People who received 1-2 doses of PPSV23 before age 71 years should receive another dose of PPSV23 vaccine at age 11 years or later if at least 5 years have passed since the previous dose. Doses of PPSV23 are not needed for people immunized with PPSV23 at or after age 74 years.  Meningococcal vaccine. Adults with asplenia or persistent complement component deficiencies  should receive 2 doses of quadrivalent meningococcal conjugate (MenACWY-D) vaccine. The doses should be obtained at least 2 months apart. Microbiologists working with certain meningococcal bacteria, [employer], people at risk during an outbreak, and people who travel to or live in countries with a high rate of meningitis should be immunized. A first-year college student up through age 66 years who is living in a residence hall should receive a dose if he did not receive a dose on or after his 16th birthday. Adults who have certain high-risk conditions should receive one  or more doses of vaccine.  Hepatitis A vaccine. Adults who wish to be protected from this disease, have certain high-risk conditions, work with hepatitis A-infected animals, work in hepatitis A research labs, or travel to or work in countries with a high rate of hepatitis A should be immunized. Adults who were previously unvaccinated and who anticipate close contact with an international adoptee during the first 60 days after arrival in the [place] from a country with a high rate of hepatitis A should be immunized.  Hepatitis B vaccine. Adults should be immunized if they wish to be protected from this disease, have certain high-risk conditions, may be exposed to blood or other infectious body fluids, are household contacts or sex partners of hepatitis B positive people, are clients or workers in certain care facilities, or travel to or work in countries with a high rate of hepatitis B.  Haemophilus influenzae type b (Hib) vaccine. A previously unvaccinated person with asplenia or sickle cell disease or having a scheduled splenectomy should receive 1 dose of Hib vaccine. Regardless of previous immunization, a recipient of a hematopoietic stem cell transplant should receive a 3-dose series 6-12 months after his successful transplant. Hib vaccine is not recommended for adults with HIV infection. Preventive Service / Frequency Ages  61 to 14  Blood pressure check.** / Every 1 to 2 years.  Lipid and cholesterol check.** / Every 5 years beginning at age 48.  Hepatitis C blood test.** / For any individual with known risks for hepatitis C.  Skin self-exam. / Monthly.  Influenza vaccine. / Every year.  Tetanus, diphtheria, and acellular pertussis (Tdap, Td) vaccine.** / Consult your health care provider. 1 dose of Td every 10 years.  Varicella vaccine.** / Consult your health care provider.  HPV vaccine. / 3 doses over 6 months, if 62 or younger.  Measles, mumps, rubella (MMR) vaccine.** / You need at least 1 dose of MMR if you were born in 1957 or later. You may also need a second dose.  Pneumococcal 13-valent conjugate (PCV13) vaccine.** / Consult your health care provider.  Pneumococcal polysaccharide (PPSV23) vaccine.** / 1 to 2 doses if you smoke cigarettes or if you have certain conditions.  Meningococcal vaccine.** / 1 dose if you are age 54 to 43 years and a Market researcher living in a residence hall, or have one of several medical conditions. You may also need additional booster doses.  Hepatitis A vaccine.** / Consult your health care provider.  Hepatitis B vaccine.** / Consult your health care provider.  Haemophilus influenzae type b (Hib) vaccine.** / Consult your health care provider. Ages 47 to 46  Blood pressure check.** / Every 1 to 2 years.  Lipid and cholesterol check.** / Every 5 years beginning at age 30.  Lung cancer screening. / Every year if you are aged 55-80 years and have a 30-pack-year history of smoking and currently smoke or have quit within the past 15 years. Yearly screening is stopped once you have quit smoking for at least 15 years or develop a health problem that would prevent you from having lung cancer treatment.  Fecal occult blood test (FOBT) of stool. / Every year beginning at age 41 and continuing until age 12. You may not have to do this test if you get a  colonoscopy every 10 years.  Flexible sigmoidoscopy** or colonoscopy.** / Every 5 years for a flexible sigmoidoscopy or every 10 years for a colonoscopy beginning at age 88 and continuing until age  75.  Hepatitis C blood test.** / For all people born from [birth year] through 1965 and any individual with known risks for hepatitis C.  Skin self-exam. / Monthly.  Influenza vaccine. / Every year.  Tetanus, diphtheria, and acellular pertussis (Tdap/Td) vaccine.** / Consult your health care provider. 1 dose of Td every 10 years.  Varicella vaccine.** / Consult your health care provider.  Zoster vaccine.** / 1 dose for adults aged 83 years or older.  Measles, mumps, rubella (MMR) vaccine.** / You need at least 1 dose of MMR if you were born in 1957 or later. You may also need a second dose.  Pneumococcal 13-valent conjugate (PCV13) vaccine.** / Consult your health care provider.  Pneumococcal polysaccharide (PPSV23) vaccine.** / 1 to 2 doses if you smoke cigarettes or if you have certain conditions.  Meningococcal vaccine.** / Consult your health care provider.  Hepatitis A vaccine.** / Consult your health care provider.  Hepatitis B vaccine.** / Consult your health care provider.  Haemophilus influenzae type b (Hib) vaccine.** / Consult your health care provider. Ages 60 and over  Blood pressure check.** / Every 1 to 2 years.  Lipid and cholesterol check.**/ Every 5 years beginning at age 38.  Lung cancer screening. / Every year if you are aged 9-80 years and have a 30-pack-year history of smoking and currently smoke or have quit within the past 15 years. Yearly screening is stopped once you have quit smoking for at least 15 years or develop a health problem that would prevent you from having lung cancer treatment.  Fecal occult blood test (FOBT) of stool. / Every year beginning at age 84 and continuing until age 26. You may not have to do this test if you get a colonoscopy every 10  years.  Flexible sigmoidoscopy** or colonoscopy.** / Every 5 years for a flexible sigmoidoscopy or every 10 years for a colonoscopy beginning at age 37 and continuing until age 89.  Hepatitis C blood test.** / For all people born from [birth year] through 1965 and any individual with known risks for hepatitis C.  Abdominal aortic aneurysm (AAA) screening.** / A one-time screening for ages 55 to 12 years who are current or former smokers.  Skin self-exam. / Monthly.  Influenza vaccine. / Every year.  Tetanus, diphtheria, and acellular pertussis (Tdap/Td) vaccine.** / 1 dose of Td every 10 years.  Varicella vaccine.** / Consult your health care provider.  Zoster vaccine.** / 1 dose for adults aged 55 years or older.  Pneumococcal 13-valent conjugate (PCV13) vaccine.** / Consult your health care provider.  Pneumococcal polysaccharide (PPSV23) vaccine.** / 1 dose for all adults aged 65 years and older.  Meningococcal vaccine.** / Consult your health care provider.  Hepatitis A vaccine.** / Consult your health care provider.  Hepatitis B vaccine.** / Consult your health care provider.  Haemophilus influenzae type b (Hib) vaccine.** / Consult your health care provider. **Family history and personal history of risk and conditions may change your health care provider's recommendations. Document Released: 09/26/2001 Document Revised: 08/05/2013 Document Reviewed: 12/26/2010 [facility] Patient Information 2015 [place], [place]. This information is not intended to replace advice given to you by your health care provider. Make sure you discuss any questions you have with your health care provider.

## 2014-11-20 NOTE — Assessment & Plan Note (Signed)
Followed by rheumatology. Well controlled with current regimen of methotrexate. Follow-up with specialist as scheduled.

## 2015-01-01 ENCOUNTER — Ambulatory Visit (AMBULATORY_SURGERY_CENTER): Payer: Self-pay | Admitting: *Deleted

## 2015-01-01 VITALS — Ht 70.0 in | Wt 234.8 lb

## 2015-01-01 DIAGNOSIS — Z1211 Encounter for screening for malignant neoplasm of colon: Secondary | ICD-10-CM

## 2015-01-01 MED ORDER — NA SULFATE-K SULFATE-MG SULF 17.5-3.13-1.6 GM/177ML PO SOLN: 1.0000 | Freq: Once | ORAL | Status: DC

## 2015-01-01 NOTE — Progress Notes (Signed)
No egg or soy allergy No issues with past sedation No diet pills emmi video declined

## 2015-01-13 ENCOUNTER — Encounter: Payer: Self-pay | Admitting: Internal Medicine

## 2015-01-13 ENCOUNTER — Ambulatory Visit (AMBULATORY_SURGERY_CENTER): Payer: BLUE CROSS/BLUE SHIELD | Admitting: Internal Medicine

## 2015-01-13 VITALS — BP 133/68 | HR 50 | Temp 96.5°F | Resp 16 | Ht 70.0 in | Wt 234.0 lb

## 2015-01-13 DIAGNOSIS — Z1211 Encounter for screening for malignant neoplasm of colon: Secondary | ICD-10-CM

## 2015-01-13 MED ORDER — SODIUM CHLORIDE 0.9 % IV SOLN: 500.0000 mL | INTRAVENOUS | Status: DC

## 2015-01-13 NOTE — Patient Instructions (Signed)
YOU HAD AN ENDOSCOPIC PROCEDURE TODAY AT THE [facility]:   Refer to the procedure report that was given to you for any specific questions about what was found during the examination.  If the procedure report does not answer your questions, please call your gastroenterologist to clarify.  If you requested that your care partner not be given the details of your procedure findings, then the procedure report has been included in a sealed envelope for you to review at your convenience later.  YOU SHOULD EXPECT: Some feelings of bloating in the abdomen. Passage of more gas than usual.  Walking can help get rid of the air that was put into your GI tract during the procedure and reduce the bloating. If you had a lower endoscopy (such as a colonoscopy or flexible sigmoidoscopy) you may notice spotting of blood in your stool or on the toilet paper. If you underwent a bowel prep for your procedure, you may not have a normal bowel movement for a few days.  Please Note:  You might notice some irritation and congestion in your nose or some drainage.  This is from the oxygen used during your procedure.  There is no need for concern and it should clear up in a day or so.  SYMPTOMS TO REPORT IMMEDIATELY:   Following lower endoscopy (colonoscopy or flexible sigmoidoscopy):  Excessive amounts of blood in the stool  Significant tenderness or worsening of abdominal pains  Swelling of the abdomen that is new, acute  Fever of 100F or higher   For urgent or emergent issues, a gastroenterologist can be reached at any hour by calling [phone].   DIET: Your first meal following the procedure should be a small meal and then it is ok to progress to your normal diet. Heavy or fried foods are harder to digest and may make you feel nauseous or bloated.  Likewise, meals heavy in dairy and vegetables can increase bloating.  Drink plenty of fluids but you should avoid alcoholic beverages for 24  hours.  ACTIVITY:  You should plan to take it easy for the rest of today and you should NOT DRIVE or use heavy machinery until tomorrow (because of the sedation medicines used during the test).    FOLLOW UP: Our staff will call the number listed on your records the next business day following your procedure to check on you and address any questions or concerns that you may have regarding the information given to you following your procedure. If we do not reach you, we will leave a message.  However, if you are feeling well and you are not experiencing any problems, there is no need to return our call.  We will assume that you have returned to your regular daily activities without incident.  If any biopsies were taken you will be contacted by phone or by letter within the next 1-3 weeks.  Please call us at [phone] if you have not heard about the biopsies in 3 weeks.    SIGNATURES/CONFIDENTIALITY: You and/or your care partner have signed paperwork which will be entered into your electronic medical record.  These signatures attest to the fact that that the information above on your After Visit Summary has been reviewed and is understood.  Full responsibility of the confidentiality of this discharge information lies with you and/or your care-partner. 

## 2015-01-13 NOTE — Op Note (Signed)
[facility]  [address] [place], [address]   COLONOSCOPY PROCEDURE REPORT  PATIENT: Robert, Velazquez  MR#: [id number] BIRTHDATE: March 12, 1966 , 48  yrs. old GENDER: male ENDOSCOPIST: Jerene Bears, MD REFERRED BY: Elyn Aquas, PA-C PROCEDURE DATE:  01/13/2015 PROCEDURE:   Colonoscopy, screening First Screening Colonoscopy - Avg.  risk and is 50 yrs.  old or older Yes.  Prior Negative Screening - Now for repeat screening. N/A  History of Adenoma - Now for follow-up colonoscopy & has been > or = to 3 yrs.  N/A  Polyps removed today? No Recommend repeat exam, <10 yrs? No ASA CLASS:   Class II INDICATIONS:Screening for colonic neoplasia and Colorectal Neoplasm Risk Assessment for this procedure is average risk. MEDICATIONS: Monitored anesthesia care and Propofol 200 mg IV  DESCRIPTION OF PROCEDURE:   After the risks benefits and alternatives of the procedure were thoroughly explained, informed consent was obtained.  The digital rectal exam revealed no abnormalities of the rectum.   The LB [id number]  endoscope was introduced through the anus and advanced to the cecum, which was identified by both the appendix and ileocecal valve. No adverse events experienced.   The quality of the prep was good, using MoviPrep  The instrument was then slowly withdrawn as the colon was fully examined. Estimated blood loss is zero unless otherwise noted in this procedure report.    COLON FINDINGS: The examined terminal ileum appeared to be normal. A normal appearing cecum, ileocecal valve, and appendiceal orifice were identified.  the ascending, transverse, descending, sigmoid colon, and rectum appeared unremarkable.  Retroflexed views revealed no abnormalities. The time to cecum = 2.7 Withdrawal time = 8.1   The scope was withdrawn and the procedure completed.  COMPLICATIONS: There were no complications.  ENDOSCOPIC IMPRESSION: 1.   The examined terminal ileum appeared to  be normal 2.   Normal colonoscopy  RECOMMENDATIONS: You should continue to follow colorectal cancer screening guidelines for "routine risk" patients with a repeat colonoscopy in 10 years. There is no need for FOBT (stool) testing for at least 5 years.  eSigned:  Jerene Bears, MD 01/13/2015 11:28 AM   cc:  the patient, Elyn Aquas, PA-C

## 2015-01-14 ENCOUNTER — Telehealth: Payer: Self-pay | Admitting: Emergency Medicine

## 2015-01-14 NOTE — Telephone Encounter (Signed)
  Follow up Call-  Call back number 01/13/2015  Post procedure Call Back phone  # [phone]  Permission to leave phone message Yes     Patient questions:  Do you have a fever, pain , or abdominal swelling? No. Pain Score  0 *  Have you tolerated food without any problems? Yes.    Have you been able to return to your normal activities? Yes.    Do you have any questions about your discharge instructions: Diet   No. Medications  No. Follow up visit  No.  Do you have questions or concerns about your Care? No.  Actions: * If pain score is 4 or above: No action needed, pain <4.

## 2015-01-29 ENCOUNTER — Ambulatory Visit: Payer: BLUE CROSS/BLUE SHIELD | Admitting: Internal Medicine

## 2015-03-06 ENCOUNTER — Other Ambulatory Visit: Payer: Self-pay | Admitting: Internal Medicine

## 2015-03-12 ENCOUNTER — Other Ambulatory Visit: Payer: Self-pay | Admitting: Physician Assistant

## 2015-03-12 DIAGNOSIS — E785 Hyperlipidemia, unspecified: Secondary | ICD-10-CM

## 2015-03-12 MED ORDER — ROSUVASTATIN CALCIUM 10 MG PO TABS: 10.0000 mg | ORAL_TABLET | Freq: Every day | ORAL | Status: DC

## 2015-07-12 LAB — CBC AND DIFFERENTIAL
HEMATOCRIT: 47 % (ref 41–53)
HEMOGLOBIN: 16.2 g/dL (ref 13.5–17.5)
Platelets: 160 10*3/uL (ref 150–399)
WBC: 5.4 10^3/mL

## 2015-07-12 LAB — HEPATIC FUNCTION PANEL
ALT: 56 U/L — AB (ref 10–40)
AST: 35 U/L (ref 14–40)
Alkaline Phosphatase: 58 U/L (ref 25–125)
Bilirubin, Direct: 0.28 mg/dL (ref 0.01–0.4)
Bilirubin, Total: 1.2 mg/dL

## 2015-07-12 LAB — HEMOGLOBIN A1C: HEMOGLOBIN A1C: 6

## 2015-07-12 LAB — LIPID PANEL
Cholesterol: 139 mg/dL (ref 0–200)
HDL: 39 mg/dL (ref 35–70)
LDL Cholesterol: 74 mg/dL
Triglycerides: 132 mg/dL (ref 40–160)

## 2015-07-12 LAB — PSA: PSA: 0.5

## 2015-07-12 LAB — BASIC METABOLIC PANEL
BUN: 8 mg/dL (ref 4–21)
Creatinine: 0.8 mg/dL (ref 0.6–1.3)
Glucose: 119 mg/dL
POTASSIUM: 4.2 mmol/L (ref 3.4–5.3)
SODIUM: 146 mmol/L (ref 137–147)

## 2015-07-13 ENCOUNTER — Ambulatory Visit (INDEPENDENT_AMBULATORY_CARE_PROVIDER_SITE_OTHER): Payer: BLUE CROSS/BLUE SHIELD | Admitting: Physician Assistant

## 2015-07-13 ENCOUNTER — Encounter: Payer: Self-pay | Admitting: Physician Assistant

## 2015-07-13 VITALS — BP 122/87 | HR 67 | Temp 98.2°F | Resp 16 | Ht 72.0 in | Wt 230.2 lb

## 2015-07-13 DIAGNOSIS — J019 Acute sinusitis, unspecified: Secondary | ICD-10-CM | POA: Diagnosis not present

## 2015-07-13 DIAGNOSIS — B9689 Other specified bacterial agents as the cause of diseases classified elsewhere: Secondary | ICD-10-CM

## 2015-07-13 MED ORDER — AMOXICILLIN-POT CLAVULANATE 875-125 MG PO TABS: 1.0000 | ORAL_TABLET | Freq: Two times a day (BID) | ORAL | Status: DC

## 2015-07-13 NOTE — Progress Notes (Signed)
Patient presents to clinic today c/o 1 month of sinus pressure, sinus pain, fatigue and non-productive cough. Symptoms have waxed and waned over the past couple of weeks but have worsening since the weekend. Denies fever, chills, chest congestion or chest pain. Has taken allergy medications and decongestants without significant relief in symptoms.  Past Medical History  Diagnosis Date  . Hyperlipidemia     NMR LDL 140(1854/1324)TG 102,HDL 33  . Arthritis     rheumatoid arthritis variant; Dr Jolee Ewing  . Hypertension   . History of chicken pox   . GERD (gastroesophageal reflux disease)     Current Outpatient Prescriptions on File Prior to Visit  Medication Sig Dispense Refill  . acetaminophen (TYLENOL) 325 MG tablet Take 650 mg by mouth as needed.      Marland Kitchen amLODipine (NORVASC) 5 MG tablet TAKE 1 TABLET (5 MG TOTAL) BY MOUTH DAILY. 90 tablet 3  . fluticasone (FLONASE) 50 MCG/ACT nasal spray Place 2 sprays into both nostrils daily. (Patient taking differently: Place 2 sprays into both nostrils daily as needed. ) 16 g 6  . folic acid (FOLVITE) A999333 MCG tablet Take 400 mcg by mouth daily.    . methotrexate (RHEUMATREX) 2.5 MG tablet Take 2.5 mg by mouth once a week. Caution:Chemotherapy. Protect from light.     . Multiple Vitamin (MULTIVITAMIN) tablet Take 1 tablet by mouth daily.    Marland Kitchen omeprazole (PRILOSEC) 20 MG capsule Take 20 mg by mouth daily. As needed, will take this or Zantac    . rosuvastatin (CRESTOR) 10 MG tablet Take 1 tablet (10 mg total) by mouth daily. 90 tablet 1   No current facility-administered medications on file prior to visit.    No Known Allergies  Family History  Problem Relation Age of Onset  . Arthritis Mother     ? type, ? not RA - Living  . Heart attack Father 65    MI ,died @ 46 (33rd MI)  . Heart attack Paternal Uncle 100    MI  . Arthritis Paternal Grandmother     ??RA  . Heart attack Paternal Grandfather 75    MI  . Lung cancer Maternal Grandfather       smoker  . Diabetes Neg Hx   . Stroke Neg Hx   . Colon cancer Neg Hx   . Colon polyps Neg Hx   . Rectal cancer Neg Hx   . Stomach cancer Neg Hx   . Heart attack Paternal Uncle   . Healthy Sister   . Asthma Son   . Allergies Son   . Healthy Son     #2    Social History   Social History  . Marital Status: Married    Spouse Name: N/A  . Number of Children: 2  . Years of Education: N/A   Occupational History  . 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 Smoking status: Never Smoker   . Smokeless tobacco: Never Used  . Alcohol Use: No  . Drug Use: No  . Sexual Activity: Not Asked   Other Topics Concern  . None   Social History Narrative   Review of Systems - See HPI.  All other ROS are negative.  BP 122/87 mmHg  Pulse 67  Temp(Src) 98.2 F (36.8 C) (Oral)  Resp 16  Ht 6' (1.829 m)  Wt 230 lb 4 oz (104.441 kg)  BMI 31.22 kg/m2  SpO2 98%  Physical Exam  Constitutional: He  is oriented to person, place, and time and well-developed, well-nourished, and in no distress.  HENT:  Head: Normocephalic and atraumatic.  Right Ear: Tympanic membrane normal.  Left Ear: Tympanic membrane normal.  Nose: Mucosal edema present. Right sinus exhibits frontal sinus tenderness. Left sinus exhibits frontal sinus tenderness.  Mouth/Throat: Uvula is midline, oropharynx is clear and moist and mucous membranes are normal. No oropharyngeal exudate, posterior oropharyngeal edema, posterior oropharyngeal erythema or tonsillar abscesses.  Eyes: Conjunctivae are normal.  Neck: Neck supple.  Cardiovascular: Normal rate, regular rhythm, normal heart sounds and intact distal pulses.   Pulmonary/Chest: Effort normal and breath sounds normal. No respiratory distress. He has no wheezes. He has no rales. He exhibits no tenderness.  Neurological: He is alert and oriented to person, place, and time.  Skin: Skin is warm and dry. No rash noted.  Psychiatric: Affect normal.  Vitals  reviewed.   No results found for this or any previous visit (from the past 2160 hour(s)).  Assessment/Plan: Acute bacterial sinusitis Rx Augmentin.  Increase fluids.  Rest.  Saline nasal spray.  Probiotic.  Mucinex as directed.  Humidifier in bedroom.  Call or return to clinic if symptoms are not improving.

## 2015-07-13 NOTE — Progress Notes (Signed)
Pre visit review using our clinic review tool, if applicable. No additional management support is needed unless otherwise documented below in the visit note/SLS  

## 2015-07-13 NOTE — Assessment & Plan Note (Signed)
Rx Augmentin.  Increase fluids.  Rest.  Saline nasal spray.  Probiotic.  Mucinex as directed.  Humidifier in bedroom.  Call or return to clinic if symptoms are not improving.  

## 2015-07-13 NOTE — Patient Instructions (Signed)
Please take antibiotic as directed.  Increase fluid intake.  Use Saline nasal spray.  Take a daily multivitamin. Delsym for cough.  Place a humidifier in the bedroom.  Please call or return clinic if symptoms are not improving.  Sinusitis Sinusitis is redness, soreness, and swelling (inflammation) of the paranasal sinuses. Paranasal sinuses are air pockets within the bones of your face (beneath the eyes, the middle of the forehead, or above the eyes). In healthy paranasal sinuses, mucus is able to drain out, and air is able to circulate through them by way of your nose. However, when your paranasal sinuses are inflamed, mucus and air can become trapped. This can allow bacteria and other germs to grow and cause infection. Sinusitis can develop quickly and last only a short time (acute) or continue over a long period (chronic). Sinusitis that lasts for more than 12 weeks is considered chronic.  CAUSES  Causes of sinusitis include:  Allergies.  Structural abnormalities, such as displacement of the cartilage that separates your nostrils (deviated septum), which can decrease the air flow through your nose and sinuses and affect sinus drainage.  Functional abnormalities, such as when the small hairs (cilia) that line your sinuses and help remove mucus do not work properly or are not present. SYMPTOMS  Symptoms of acute and chronic sinusitis are the same. The primary symptoms are pain and pressure around the affected sinuses. Other symptoms include:  Upper toothache.  Earache.  Headache.  Bad breath.  Decreased sense of smell and taste.  A cough, which worsens when you are lying flat.  Fatigue.  Fever.  Thick drainage from your nose, which often is green and may contain pus (purulent).  Swelling and warmth over the affected sinuses. DIAGNOSIS  Your caregiver will perform a physical exam. During the exam, your caregiver may:  Look in your nose for signs of abnormal growths in your  nostrils (nasal polyps).  Tap over the affected sinus to check for signs of infection.  View the inside of your sinuses (endoscopy) with a special imaging device with a light attached (endoscope), which is inserted into your sinuses. If your caregiver suspects that you have chronic sinusitis, one or more of the following tests may be recommended:  Allergy tests.  Nasal culture A sample of mucus is taken from your nose and sent to a lab and screened for bacteria.  Nasal cytology A sample of mucus is taken from your nose and examined by your caregiver to determine if your sinusitis is related to an allergy. TREATMENT  Most cases of acute sinusitis are related to a viral infection and will resolve on their own within 10 days. Sometimes medicines are prescribed to help relieve symptoms (pain medicine, decongestants, nasal steroid sprays, or saline sprays).  However, for sinusitis related to a bacterial infection, your caregiver will prescribe antibiotic medicines. These are medicines that will help kill the bacteria causing the infection.  Rarely, sinusitis is caused by a fungal infection. In theses cases, your caregiver will prescribe antifungal medicine. For some cases of chronic sinusitis, surgery is needed. Generally, these are cases in which sinusitis recurs more than 3 times per year, despite other treatments. HOME CARE INSTRUCTIONS   Drink plenty of water. Water helps thin the mucus so your sinuses can drain more easily.  Use a humidifier.  Inhale steam 3 to 4 times a day (for example, sit in the bathroom with the shower running).  Apply a warm, moist washcloth to your face 3 to 4   times a day, or as directed by your caregiver.  Use saline nasal sprays to help moisten and clean your sinuses.  Take over-the-counter or prescription medicines for pain, discomfort, or fever only as directed by your caregiver. SEEK IMMEDIATE MEDICAL CARE IF:  You have increasing pain or severe  headaches.  You have nausea, vomiting, or drowsiness.  You have swelling around your face.  You have vision problems.  You have a stiff neck.  You have difficulty breathing. MAKE SURE YOU:   Understand these instructions.  Will watch your condition.  Will get help right away if you are not doing well or get worse. Document Released: 07/31/2005 Document Revised: 10/23/2011 Document Reviewed: 08/15/2011 Exit[employer] Patient Information 2014 [employer].   

## 2015-08-13 ENCOUNTER — Encounter: Payer: Self-pay | Admitting: Physician Assistant

## 2015-08-17 ENCOUNTER — Telehealth: Payer: Self-pay | Admitting: Physician Assistant

## 2015-08-17 NOTE — Telephone Encounter (Signed)
Received lab result form employee wellness screen (11/16). Blood count looks good overall. His A1C is at 6.0 placing him in the pre-diabetic range. Limit carbs and increase aerobic exercise to promote weight loss and to stabilize glucose levels. Kidney function looks good. Cholesterol looks good. One liver enzyme ALT was just slightly elevated. Recommend limiting alcohol and tylenol-containing products. WE should recheck LFTs here in 1 month. Everything else looks good and the results have been abstracted into his electronic medical records.

## 2015-08-17 NOTE — Telephone Encounter (Signed)
Not as long as he can send me his lab results from specialists and I will review them for repeat liver testing. Suspect Dr. Charlestine Night will check

## 2015-08-17 NOTE — Telephone Encounter (Signed)
Called and spoke with the pt and informed him of the note below.  Pt verbalized understanding.  Pt stated that he will be having blood work done next week at his appt with Dr. Charlestine Night, and blood work again in 70 month for insurance.  Pt wanted to know if he will still need to have the LFT's rechecked in 1 month here.  Please advise.//AB/CMA

## 2015-08-18 NOTE — Telephone Encounter (Signed)
Called and spoke with the pt and informed him of the note below.  Pt verbalized understanding and agreed.//AB/CMA

## 2015-09-22 ENCOUNTER — Telehealth: Payer: Self-pay | Admitting: Physician Assistant

## 2015-09-22 NOTE — Telephone Encounter (Signed)
Pt declined flu shot °

## 2015-09-22 NOTE — Telephone Encounter (Signed)
Recorded that pt declined flu shot.//AB/CMA

## 2015-09-24 ENCOUNTER — Ambulatory Visit (INDEPENDENT_AMBULATORY_CARE_PROVIDER_SITE_OTHER): Payer: BLUE CROSS/BLUE SHIELD | Admitting: Physician Assistant

## 2015-09-24 ENCOUNTER — Encounter: Payer: Self-pay | Admitting: Physician Assistant

## 2015-09-24 VITALS — BP 153/92 | HR 64 | Temp 97.9°F | Ht 72.0 in | Wt 233.0 lb

## 2015-09-24 DIAGNOSIS — R0981 Nasal congestion: Secondary | ICD-10-CM

## 2015-09-24 HISTORY — DX: Nasal congestion: R09.81

## 2015-09-24 MED ORDER — FLUTICASONE PROPIONATE 50 MCG/ACT NA SUSP: 2.0000 | Freq: Every day | NASAL | Status: DC

## 2015-09-24 MED ORDER — AZELASTINE HCL 0.1 % NA SOLN
1.0000 | Freq: Two times a day (BID) | NASAL | Status: DC
Start: 2015-09-24 — End: 2017-06-07

## 2015-09-24 NOTE — Progress Notes (Signed)
Patient presents to clinic today c/o 2 months of off and on nasal congestion and sinus pressure that occurs intermittently. Biggest concern is nasal congestion and rhinorrhea. Has taken Flonase occasionally with some relief. Denies sinus pain, fever, chills, aches. Denies tooth pain or ear pressure/pain.  Past Medical History  Diagnosis Date  . Hyperlipidemia     NMR LDL 140(1854/1324)TG 102,HDL 33  . Arthritis     rheumatoid arthritis variant; Dr Jolee Ewing  . Hypertension   . History of chicken pox   . GERD (gastroesophageal reflux disease)     Current Outpatient Prescriptions on File Prior to Visit  Medication Sig Dispense Refill  . acetaminophen (TYLENOL) 325 MG tablet Take 650 mg by mouth as needed.      Marland Kitchen amLODipine (NORVASC) 5 MG tablet TAKE 1 TABLET (5 MG TOTAL) BY MOUTH DAILY. 90 tablet 3  . folic acid (FOLVITE) A999333 MCG tablet Take 400 mcg by mouth daily.    . methotrexate (RHEUMATREX) 2.5 MG tablet Take 2.5 mg by mouth once a week. Caution:Chemotherapy. Protect from light.     . Multiple Vitamin (MULTIVITAMIN) tablet Take 1 tablet by mouth daily.    . rosuvastatin (CRESTOR) 10 MG tablet Take 1 tablet (10 mg total) by mouth daily. 90 tablet 1  . omeprazole (PRILOSEC) 20 MG capsule Take 20 mg by mouth daily. Reported on 09/24/2015     No current facility-administered medications on file prior to visit.    No Known Allergies  Family History  Problem Relation Age of Onset  . Arthritis Mother     ? type, ? not RA - Living  . Heart attack Father 53    MI ,died @ 53 (42rd MI)  . Heart attack Paternal Uncle 51    MI  . Arthritis Paternal Grandmother     ??RA  . Heart attack Paternal Grandfather 66    MI  . Lung cancer Maternal Grandfather     smoker  . Diabetes Neg Hx   . Stroke Neg Hx   . Colon cancer Neg Hx   . Colon polyps Neg Hx   . Rectal cancer Neg Hx   . Stomach cancer Neg Hx   . Heart attack Paternal Uncle   . Healthy Sister   . Asthma Son   . Allergies  Son   . Healthy Son     #2    Social History   Social History  . Marital Status: Married    Spouse Name: N/A  . Number of Children: 2  . Years of Education: N/A   Occupational History  . 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 Smoking status: Never Smoker   . Smokeless tobacco: Never Used  . Alcohol Use: No  . Drug Use: No  . Sexual Activity: Not Asked   Other Topics Concern  . None   Social History Narrative   Review of Systems - See HPI.  All other ROS are negative.  BP 153/92 mmHg  Pulse 64  Temp(Src) 97.9 F (36.6 C) (Oral)  Ht 6' (1.829 m)  Wt 233 lb (105.688 kg)  BMI 31.59 kg/m2  SpO2 99%  Physical Exam  Constitutional: He is well-developed, well-nourished, and in no distress.  HENT:  Head: Normocephalic and atraumatic.  Right Ear: External ear normal.  Left Ear: External ear normal.  Nose: Nose normal.  Mouth/Throat: Oropharynx is clear and moist. No oropharyngeal exudate.  TM within normal limits bilaterally.  Eyes: Conjunctivae  are normal.  Cardiovascular: Normal rate, regular rhythm, normal heart sounds and intact distal pulses.   Pulmonary/Chest: Effort normal and breath sounds normal. No respiratory distress. He has no wheezes. He has no rales. He exhibits no tenderness.  Skin: Skin is warm and dry.  Psychiatric: Affect normal.  Vitals reviewed.   Recent Results (from the past 2160 hour(s))  CBC and differential     Status: None   Collection Time: 07/12/15 12:00 AM  Result Value Ref Range   Hemoglobin 16.2 13.5 - 17.5 g/dL   HCT 47 41 - 53 %   Platelets 160 150 - 399 K/L   WBC 5.4 123456  Basic metabolic panel     Status: None   Collection Time: 07/12/15 12:00 AM  Result Value Ref Range   Glucose 119 mg/dL   BUN 8 4 - 21 mg/dL   Creatinine 0.8 0.6 - 1.3 mg/dL   Potassium 4.2 3.4 - 5.3 mmol/L   Sodium 146 137 - 147 mmol/L  Lipid panel     Status: None   Collection Time: 07/12/15 12:00 AM  Result Value Ref Range    Triglycerides 132 40 - 160 mg/dL   Cholesterol 139 0 - 200 mg/dL   HDL 39 35 - 70 mg/dL   LDL Cholesterol 74 mg/dL  Hepatic function panel     Status: Abnormal   Collection Time: 07/12/15 12:00 AM  Result Value Ref Range   Alkaline Phosphatase 58 25 - 125 U/L   ALT 56 (A) 10 - 40 U/L   AST 35 14 - 40 U/L   Bilirubin, Direct 0.28 0.01 - 0.4 mg/dL   Bilirubin, Total 1.2 mg/dL  Hemoglobin A1c     Status: None   Collection Time: 07/12/15 12:00 AM  Result Value Ref Range   Hemoglobin A1C 6.0   PSA     Status: None   Collection Time: 07/12/15 12:00 AM  Result Value Ref Range   PSA 0.5     Assessment/Plan: Chronic nasal congestion Seems related to environmental allergies.  Will begin Flonase daily along with Astelin spray. Increase fluids. ENT if symptoms are not significantly improving to r/o obstruction.

## 2015-09-24 NOTE — Progress Notes (Signed)
Pre visit review using our clinic review tool, if applicable. No additional management support is needed unless otherwise documented below in the visit note. 

## 2015-09-24 NOTE — Patient Instructions (Signed)
Please start the Flonase and Astelin nasal sprays daily. Stay hydrated. Consider restarting a claritin. Sometimes is takes a combination of medication to get things under control.  If symptoms not improving, we will need to set you up with an ENT specialist.

## 2015-09-27 NOTE — Assessment & Plan Note (Signed)
Seems related to environmental allergies.  Will begin Flonase daily along with Astelin spray. Increase fluids. ENT if symptoms are not significantly improving to r/o obstruction.

## 2015-10-08 ENCOUNTER — Ambulatory Visit (INDEPENDENT_AMBULATORY_CARE_PROVIDER_SITE_OTHER): Payer: BLUE CROSS/BLUE SHIELD | Admitting: Medical

## 2015-10-08 ENCOUNTER — Encounter: Payer: Self-pay | Admitting: Medical

## 2015-10-08 VITALS — BP 130/80 | HR 76 | Temp 98.3°F | Ht 72.0 in | Wt 233.0 lb

## 2015-10-08 DIAGNOSIS — J3489 Other specified disorders of nose and nasal sinuses: Secondary | ICD-10-CM | POA: Diagnosis not present

## 2015-10-08 DIAGNOSIS — R0981 Nasal congestion: Secondary | ICD-10-CM

## 2015-10-08 MED ORDER — AZITHROMYCIN 250 MG PO TABS: ORAL_TABLET | ORAL | Status: DC

## 2015-10-08 MED ORDER — METHYLPREDNISOLONE ACETATE 40 MG/ML IJ SUSP
40.0000 mg | Freq: Once | INTRAMUSCULAR | Status: AC
  Administered 2015-10-08: 40 mg via INTRAMUSCULAR

## 2015-10-08 NOTE — Patient Instructions (Signed)
Your chronic nasal congestion may be allergic rhinitis related or vasomotor rhinitis.  Since not decongested with both flonase and astelin. Will give depomedrol 40 mg im today in attempt to give you some relief.  With intermittent sinus pressure will prescribe azithromycin as approach the weekend.(concern for possible infection)  Will go ahead and refer to ENT  Follow up in 2-3 weeks or as needed

## 2015-10-08 NOTE — Progress Notes (Signed)
Pre visit review using our clinic review tool, if applicable. No additional management support is needed unless otherwise documented below in the visit note. 

## 2015-10-08 NOTE — Progress Notes (Signed)
Subjective:    Patient ID: Robert Velazquez, male    DOB: 1966-08-09, 50 y.o.   MRN: [id number]  HPI  Pt in with some recent sinus pressure for one week. Pt states coughing moderate. He thinks could bring up mucous.  Pt has some chronic nasal congestion and on flonase and astelin. Pt was seen 2 weeks ago. But overall congested for 2.5 months.  No fever, no chills or sweats.  Appointment to ENT was considered at end of note last visit.   Some faint sinus pressure.       Review of Systems  Constitutional: Negative for fever, chills and fatigue.  HENT: Positive for congestion, postnasal drip and sinus pressure. Negative for ear pain and facial swelling.   Respiratory: Positive for cough. Negative for chest tightness, shortness of breath and wheezing.        Minimal. Pt feels like he may cough up some mucous at times.   Cardiovascular: Negative for chest pain and palpitations.  Musculoskeletal: Negative for back pain.  Neurological: Negative for dizziness and headaches.  Hematological: Negative for adenopathy. Does not bruise/bleed easily.   Past Medical History  Diagnosis Date  . Hyperlipidemia     NMR LDL 140(1854/1324)TG 102,HDL 33  . Arthritis     rheumatoid arthritis variant; Dr Jolee Ewing  . Hypertension   . History of chicken pox   . GERD (gastroesophageal reflux disease)     Social History   Social History  . Marital Status: Married    Spouse Name: N/A  . Number of Children: 2  . Years of Education: N/A   Occupational History  . 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 Smoking status: Never Smoker   . Smokeless tobacco: Never Used  . Alcohol Use: No  . Drug Use: No  . Sexual Activity: Not on file   Other Topics Concern  . Not on file   Social History Narrative    Past Surgical History  Procedure Laterality Date  . Knee surgery      arthoscopic x2  . Mouth surgery      wisdom teeth & tooth removed from palate & re-implated  .  Tonsillectomy    . Epidural block injection  2011-2014    ? 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 Arthritis Mother     ? type, ? not RA - Living  . Heart attack Father 36    MI ,died @ 55 (58rd MI)  . Heart attack Paternal Uncle 89    MI  . Arthritis Paternal Grandmother     ??RA  . Heart attack Paternal Grandfather 65    MI  . Lung cancer Maternal Grandfather     smoker  . Diabetes Neg Hx   . Stroke Neg Hx   . Colon cancer Neg Hx   . Colon polyps Neg Hx   . Rectal cancer Neg Hx   . Stomach cancer Neg Hx   . Heart attack Paternal Uncle   . Healthy Sister   . Asthma Son   . Allergies Son   . Healthy Son     #2    No Known Allergies  Current Outpatient Prescriptions on File Prior to Visit  Medication Sig Dispense Refill  . acetaminophen (TYLENOL) 325 MG tablet Take 650 mg by mouth as needed.      Marland Kitchen amLODipine (NORVASC) 5 MG tablet TAKE  1 TABLET (5 MG TOTAL) BY MOUTH DAILY. 90 tablet 3  . azelastine (ASTELIN) 0.1 % nasal spray Place 1 spray into both nostrils 2 (two) times daily. Use in each nostril as directed 30 mL 12  . fluticasone (FLONASE) 50 MCG/ACT nasal spray Place 2 sprays into both nostrils daily. 16 g 6  . folic acid (FOLVITE) A999333 MCG tablet Take 400 mcg by mouth daily.    . methotrexate (RHEUMATREX) 2.5 MG tablet Take 2.5 mg by mouth once a week. Caution:Chemotherapy. Protect from light.     . Multiple Vitamin (MULTIVITAMIN) tablet Take 1 tablet by mouth daily.    Marland Kitchen omeprazole (PRILOSEC) 20 MG capsule Take 20 mg by mouth daily. Reported on 09/24/2015    . rosuvastatin (CRESTOR) 10 MG tablet Take 1 tablet (10 mg total) by mouth daily. 90 tablet 1   No current facility-administered medications on file prior to visit.    BP 130/80 mmHg  Pulse 76  Temp(Src) 98.3 F (36.8 C) (Oral)  Ht 6' (1.829 m)  Wt 233 lb (105.688 kg)  BMI 31.59 kg/m2  SpO2 97%       Objective:   Physical  Exam   General  Mental Status - Alert. General Appearance - Well groomed. Not in acute distress.  Skin Rashes- No Rashes.  HEENT Head- Normal. Ear Auditory Canal - Left- Normal. Right - Normal.Tympanic Membrane- Left- Normal. Right- Normal. Eye Sclera/Conjunctiva- Left- Normal. Right- Normal. Nose & Sinuses Nasal Mucosa- Left-  Boggy and Congested. Right-  Boggy and  Congested.Bilateral faint  maxillary and faint frontal sinus pressure.(but no pain) Mouth & Throat Lips: Upper Lip- Normal: no dryness, cracking, pallor, cyanosis, or vesicular eruption. Lower Lip-Normal: no dryness, cracking, pallor, cyanosis or vesicular eruption. Buccal Mucosa- Bilateral- No Aphthous ulcers. Oropharynx- No Discharge or Erythema. +pnd Tonsils: Characteristics- Bilateral- No Erythema or Congestion. Size/Enlargement- Bilateral- No enlargement. Discharge- bilateral-None.  Neck Neck- Supple. No Masses.   Chest and Lung Exam Auscultation: Breath Sounds:-Clear even and unlabored.  Cardiovascular Auscultation:Rythm- Regular, rate and rhythm. Murmurs & Other Heart Sounds:Ausculatation of the heart reveal- No Murmurs.  Lymphatic Head & Neck General Head & Neck Lymphatics: Bilateral: Description- No Localized lymphadenopathy.      Assessment & Plan:  Your chronic nasal congestion may be allergic rhinitis related or vasomotor rhinitis.  Since not decongested with both flonase and astelin. Will give depomedrol 40 mg im today in attempt to give you some relief.  With intermittent sinus pressure will prescribe azithromycin as approach the weekend.(concern for possible infection)  Will go ahead and refer to ENT  Follow up in 2-3 weeks or as needed

## 2015-11-22 ENCOUNTER — Ambulatory Visit: Payer: BLUE CROSS/BLUE SHIELD | Admitting: Physician Assistant

## 2015-11-29 ENCOUNTER — Ambulatory Visit (INDEPENDENT_AMBULATORY_CARE_PROVIDER_SITE_OTHER): Payer: BLUE CROSS/BLUE SHIELD | Admitting: Physician Assistant

## 2015-11-29 ENCOUNTER — Encounter: Payer: Self-pay | Admitting: Physician Assistant

## 2015-11-29 VITALS — BP 120/80 | HR 82 | Temp 98.3°F | Ht 72.0 in | Wt 227.4 lb

## 2015-11-29 DIAGNOSIS — Z0000 Encounter for general adult medical examination without abnormal findings: Secondary | ICD-10-CM

## 2015-11-29 DIAGNOSIS — E785 Hyperlipidemia, unspecified: Secondary | ICD-10-CM | POA: Diagnosis not present

## 2015-11-29 DIAGNOSIS — IMO0002 Reserved for concepts with insufficient information to code with codable children: Secondary | ICD-10-CM

## 2015-11-29 DIAGNOSIS — E668 Other obesity: Secondary | ICD-10-CM | POA: Diagnosis not present

## 2015-11-29 DIAGNOSIS — I10 Essential (primary) hypertension: Secondary | ICD-10-CM | POA: Diagnosis not present

## 2015-11-29 DIAGNOSIS — Z125 Encounter for screening for malignant neoplasm of prostate: Secondary | ICD-10-CM | POA: Diagnosis not present

## 2015-11-29 DIAGNOSIS — M069 Rheumatoid arthritis, unspecified: Secondary | ICD-10-CM | POA: Diagnosis not present

## 2015-11-29 LAB — LIPID PANEL
CHOLESTEROL: 135 mg/dL (ref 0–200)
HDL: 36.3 mg/dL — AB (ref >39.00)
LDL CALC: 75 mg/dL (ref 0–99)
NonHDL: 98.44
TRIGLYCERIDES: 116 mg/dL (ref 0.0–149.0)
Total CHOL/HDL Ratio: 4
VLDL: 23.2 mg/dL (ref 0.0–40.0)

## 2015-11-29 LAB — URINALYSIS, ROUTINE W REFLEX MICROSCOPIC
Bilirubin Urine: NEGATIVE
Ketones, ur: NEGATIVE
Leukocytes, UA: NEGATIVE
NITRITE: NEGATIVE
SPECIFIC GRAVITY, URINE: 1.01 (ref 1.000–1.030)
URINE GLUCOSE: NEGATIVE
Urobilinogen, UA: 0.2 (ref 0.0–1.0)
WBC UA: NONE SEEN (ref 0–?)
pH: 6 (ref 5.0–8.0)

## 2015-11-29 LAB — COMPREHENSIVE METABOLIC PANEL
ALT: 42 U/L (ref 0–53)
AST: 29 U/L (ref 0–37)
Albumin: 4.5 g/dL (ref 3.5–5.2)
Alkaline Phosphatase: 46 U/L (ref 39–117)
BILIRUBIN TOTAL: 1.6 mg/dL — AB (ref 0.2–1.2)
BUN: 14 mg/dL (ref 6–23)
CHLORIDE: 107 meq/L (ref 96–112)
CO2: 26 meq/L (ref 19–32)
CREATININE: 1.14 mg/dL (ref 0.40–1.50)
Calcium: 9.7 mg/dL (ref 8.4–10.5)
GFR: 71.83 mL/min (ref >60.00)
GLUCOSE: 123 mg/dL — AB (ref 70–99)
Potassium: 3.6 mEq/L (ref 3.5–5.1)
SODIUM: 143 meq/L (ref 135–145)
Total Protein: 6.8 g/dL (ref 6.0–8.3)

## 2015-11-29 LAB — CBC
HCT: 47.3 % (ref 39.0–52.0)
Hemoglobin: 16.1 g/dL (ref 13.0–17.0)
MCHC: 34 g/dL (ref 30.0–36.0)
MCV: 93.6 fl (ref 78.0–100.0)
PLATELETS: 194 10*3/uL (ref 150.0–400.0)
RBC: 5.06 Mil/uL (ref 4.22–5.81)
RDW: 15 % (ref 11.5–15.5)
WBC: 6.4 10*3/uL (ref 4.0–10.5)

## 2015-11-29 LAB — PSA: PSA: 0.65 ng/mL (ref 0.10–4.00)

## 2015-11-29 LAB — HEMOGLOBIN A1C: Hgb A1c MFr Bld: 5.9 % (ref 4.6–6.5)

## 2015-11-29 LAB — TSH: TSH: 1.1 u[IU]/mL (ref 0.35–4.50)

## 2015-11-29 MED ORDER — AMLODIPINE BESYLATE 5 MG PO TABS: ORAL_TABLET | ORAL | Status: DC

## 2015-11-29 NOTE — Assessment & Plan Note (Signed)
Will repeat lipid panel and lft today. Will alter regimen based on results.

## 2015-11-29 NOTE — Patient Instructions (Signed)
Please go to the lab for blood work.   Our office will call you with your results unless you have chosen to receive results via MyChart.  If your blood work is normal we will follow-up each year for physicals and as scheduled for chronic medical problems.  If anything is abnormal we will treat accordingly and get you in for a follow-up.  Please continue chronic medications as directed. Keep up with the new diet and exercise regimen to promote healthy weight.  Preventive Care for Adults, Male A healthy lifestyle and preventive care can promote health and wellness. Preventive health guidelines for men include the following key practices:  A routine yearly physical is a good way to check with your health care provider about your health and preventative screening. It is a chance to share any concerns and updates on your health and to receive a thorough exam.  Visit your dentist for a routine exam and preventative care every 6 months. Brush your teeth twice a day and floss once a day. Good oral hygiene prevents tooth decay and gum disease.  The frequency of eye exams is based on your age, health, family medical history, use of contact lenses, and other factors. Follow your health care provider's recommendations for frequency of eye exams.  Eat a healthy diet. Foods such as vegetables, fruits, whole grains, low-fat dairy products, and lean protein foods contain the nutrients you need without too many calories. Decrease your intake of foods high in solid fats, added sugars, and salt. Eat the right amount of calories for you.Get information about a proper diet from your health care provider, if necessary.  Regular physical exercise is one of the most important things you can do for your health. Most adults should get at least 150 minutes of moderate-intensity exercise (any activity that increases your heart rate and causes you to sweat) each week. In addition, most adults need muscle-strengthening  exercises on 2 or more days a week.  Maintain a healthy weight. The body mass index (BMI) is a screening tool to identify possible weight problems. It provides an estimate of body fat based on height and weight. Your health care provider can find your BMI and can help you achieve or maintain a healthy weight.For adults 20 years and older:  A BMI below 18.5 is considered underweight.  A BMI of 18.5 to 24.9 is normal.  A BMI of 25 to 29.9 is considered overweight.  A BMI of 30 and above is considered obese.  Maintain normal blood lipids and cholesterol levels by exercising and minimizing your intake of saturated fat. Eat a balanced diet with plenty of fruit and vegetables. Blood tests for lipids and cholesterol should begin at age 24 and be repeated every 5 years. If your lipid or cholesterol levels are high, you are over 50, or you are at high risk for heart disease, you may need your cholesterol levels checked more frequently.Ongoing high lipid and cholesterol levels should be treated with medicines if diet and exercise are not working.  If you smoke, find out from your health care provider how to quit. If you do not use tobacco, do not start.  Lung cancer screening is recommended for adults aged 60-80 years who are at high risk for developing lung cancer because of a history of smoking. A yearly low-dose CT scan of the lungs is recommended for people who have at least a 30-pack-year history of smoking and are a current smoker or have quit within the past  15 years. A pack year of smoking is smoking an average of 1 pack of cigarettes a day for 1 year (for example: 1 pack a day for 30 years or 2 packs a day for 15 years). Yearly screening should continue until the smoker has stopped smoking for at least 15 years. Yearly screening should be stopped for people who develop a health problem that would prevent them from having lung cancer treatment.  If you choose to drink alcohol, do not have more than  2 drinks per day. One drink is considered to be 12 ounces (355 mL) of beer, 5 ounces (148 mL) of wine, or 1.5 ounces (44 mL) of liquor.  Avoid use of street drugs. Do not share needles with anyone. Ask for help if you need support or instructions about stopping the use of drugs.  High blood pressure causes heart disease and increases the risk of stroke. Your blood pressure should be checked at least every 1-2 years. Ongoing high blood pressure should be treated with medicines, if weight loss and exercise are not effective.  If you are 74-26 years old, ask your health care provider if you should take aspirin to prevent heart disease.  Diabetes screening is done by taking a blood sample to check your blood glucose level after you have not eaten for a certain period of time (fasting). If you are not overweight and you do not have risk factors for diabetes, you should be screened once every 3 years starting at age 62. If you are overweight or obese and you are 99-98 years of age, you should be screened for diabetes every year as part of your cardiovascular risk assessment.  Colorectal cancer can be detected and often prevented. Most routine colorectal cancer screening begins at the age of 49 and continues through age 93. However, your health care provider may recommend screening at an earlier age if you have risk factors for colon cancer. On a yearly basis, your health care provider may provide home test kits to check for hidden blood in the stool. Use of a small camera at the end of a tube to directly examine the colon (sigmoidoscopy or colonoscopy) can detect the earliest forms of colorectal cancer. Talk to your health care provider about this at age 43, when routine screening begins. Direct exam of the colon should be repeated every 5-10 years through age 70, unless early forms of precancerous polyps or small growths are found.  People who are at an increased risk for hepatitis B should be screened for  this virus. You are considered at high risk for hepatitis B if:  You were born in a country where hepatitis B occurs often. Talk with your health care provider about which countries are considered high risk.  Your parents were born in a high-risk country and you have not received a shot to protect against hepatitis B (hepatitis B vaccine).  You have HIV or AIDS.  You use needles to inject street drugs.  You live with, or have sex with, someone who has hepatitis B.  You are a man who has sex with other men (MSM).  You get hemodialysis treatment.  You take certain medicines for conditions such as cancer, organ transplantation, and autoimmune conditions.  Hepatitis C blood testing is recommended for all people born from [birth year] through 1965 and any individual with known risks for hepatitis C.  Practice safe sex. Use condoms and avoid high-risk sexual practices to reduce the spread of sexually transmitted infections (STIs).  STIs include gonorrhea, chlamydia, syphilis, trichomonas, herpes, HPV, and human immunodeficiency virus (HIV). Herpes, HIV, and HPV are viral illnesses that have no cure. They can result in disability, cancer, and death.  If you are a man who has sex with other men, you should be screened at least once per year for:  HIV.  Urethral, rectal, and pharyngeal infection of gonorrhea, chlamydia, or both.  If you are at risk of being infected with HIV, it is recommended that you take a prescription medicine daily to prevent HIV infection. This is called preexposure prophylaxis (PrEP). You are considered at risk if:  You are a man who has sex with other men (MSM) and have other risk factors.  You are a heterosexual man, are sexually active, and are at increased risk for HIV infection.  You take drugs by injection.  You are sexually active with a partner who has HIV.  Talk with your health care provider about whether you are at high risk of being infected with HIV. If you  choose to begin PrEP, you should first be tested for HIV. You should then be tested every 3 months for as long as you are taking PrEP.  A one-time screening for abdominal aortic aneurysm (AAA) and surgical repair of large AAAs by ultrasound are recommended for men ages 63 to 59 years who are current or former smokers.  Healthy men should no longer receive prostate-specific antigen (PSA) blood tests as part of routine cancer screening. Talk with your health care provider about prostate cancer screening.  Testicular cancer screening is not recommended for adult males who have no symptoms. Screening includes self-exam, a health care provider exam, and other screening tests. Consult with your health care provider about any symptoms you have or any concerns you have about testicular cancer.  Use sunscreen. Apply sunscreen liberally and repeatedly throughout the day. You should seek shade when your shadow is shorter than you. Protect yourself by wearing long sleeves, pants, a wide-brimmed hat, and sunglasses year round, whenever you are outdoors.  Once a month, do a whole-body skin exam, using a mirror to look at the skin on your back. Tell your health care provider about new moles, moles that have irregular borders, moles that are larger than a pencil eraser, or moles that have changed in shape or color.  Stay current with required vaccines (immunizations).  Influenza vaccine. All adults should be immunized every year.  Tetanus, diphtheria, and acellular pertussis (Td, Tdap) vaccine. An adult who has not previously received Tdap or who does not know his vaccine status should receive 1 dose of Tdap. This initial dose should be followed by tetanus and diphtheria toxoids (Td) booster doses every 10 years. Adults with an unknown or incomplete history of completing a 3-dose immunization series with Td-containing vaccines should begin or complete a primary immunization series including a Tdap dose. Adults  should receive a Td booster every 10 years.  Varicella vaccine. An adult without evidence of immunity to varicella should receive 2 doses or a second dose if he has previously received 1 dose.  Human papillomavirus (HPV) vaccine. Males aged 11-21 years who have not received the vaccine previously should receive the 3-dose series. Males aged 22-26 years may be immunized. Immunization is recommended through the age of 52 years for any male who has sex with males and did not get any or all doses earlier. Immunization is recommended for any person with an immunocompromised condition through the age of 38 years if he  did not get any or all doses earlier. During the 3-dose series, the second dose should be obtained 4-8 weeks after the first dose. The third dose should be obtained 24 weeks after the first dose and 16 weeks after the second dose.  Zoster vaccine. One dose is recommended for adults aged 56 years or older unless certain conditions are present.  Measles, mumps, and rubella (MMR) vaccine. Adults born before [birth year] generally are considered immune to measles and mumps. Adults born in [birth year] or later should have 1 or more doses of MMR vaccine unless there is a contraindication to the vaccine or there is laboratory evidence of immunity to each of the three diseases. A routine second dose of MMR vaccine should be obtained at least 28 days after the first dose for students attending postsecondary schools, health care workers, or international travelers. People who received inactivated measles vaccine or an unknown type of measles vaccine during 1963-1967 should receive 2 doses of MMR vaccine. People who received inactivated mumps vaccine or an unknown type of mumps vaccine before 1979 and are at high risk for mumps infection should consider immunization with 2 doses of MMR vaccine. Unvaccinated health care workers born before [birth year] who lack laboratory evidence of measles, mumps, or rubella immunity or laboratory  confirmation of disease should consider measles and mumps immunization with 2 doses of MMR vaccine or rubella immunization with 1 dose of MMR vaccine.  Pneumococcal 13-valent conjugate (PCV13) vaccine. When indicated, a person who is uncertain of his immunization history and has no record of immunization should receive the PCV13 vaccine. All adults 45 years of age and older should receive this vaccine. An adult aged 39 years or older who has certain medical conditions and has not been previously immunized should receive 1 dose of PCV13 vaccine. This PCV13 should be followed with a dose of pneumococcal polysaccharide (PPSV23) vaccine. Adults who are at high risk for pneumococcal disease should obtain the PPSV23 vaccine at least 8 weeks after the dose of PCV13 vaccine. Adults older than 52 years of age who have normal immune system function should obtain the PPSV23 vaccine dose at least 1 year after the dose of PCV13 vaccine.  Pneumococcal polysaccharide (PPSV23) vaccine. When PCV13 is also indicated, PCV13 should be obtained first. All adults aged 42 years and older should be immunized. An adult younger than age 70 years who has certain medical conditions should be immunized. Any person who resides in a nursing home or long-term care facility should be immunized. An adult smoker should be immunized. People with an immunocompromised condition and certain other conditions should receive both PCV13 and PPSV23 vaccines. People with human immunodeficiency virus (HIV) infection should be immunized as soon as possible after diagnosis. Immunization during chemotherapy or radiation therapy should be avoided. Routine use of PPSV23 vaccine is not recommended for American Indians, [place] Natives, or people younger than 65 years unless there are medical conditions that require PPSV23 vaccine. When indicated, people who have unknown immunization and have no record of immunization should receive PPSV23 vaccine. One-time  revaccination 5 years after the first dose of PPSV23 is recommended for people aged 19-64 years who have chronic kidney failure, nephrotic syndrome, asplenia, or immunocompromised conditions. People who received 1-2 doses of PPSV23 before age 2 years should receive another dose of PPSV23 vaccine at age 58 years or later if at least 5 years have passed since the previous dose. Doses of PPSV23 are not needed for people immunized with PPSV23 at or after  age 8 years.  Meningococcal vaccine. Adults with asplenia or persistent complement component deficiencies should receive 2 doses of quadrivalent meningococcal conjugate (MenACWY-D) vaccine. The doses should be obtained at least 2 months apart. Microbiologists working with certain meningococcal bacteria, [employer] recruits, people at risk during an outbreak, and people who travel to or live in countries with a high rate of meningitis should be immunized. A first-year college student up through age 62 years who is living in a residence hall should receive a dose if he did not receive a dose on or after his 16th birthday. Adults who have certain high-risk conditions should receive one or more doses of vaccine.  Hepatitis A vaccine. Adults who wish to be protected from this disease, have chronic liver disease, work with hepatitis A-infected animals, work in hepatitis A research labs, or travel to or work in countries with a high rate of hepatitis A should be immunized. Adults who were previously unvaccinated and who anticipate close contact with an international adoptee during the first 60 days after arrival in the [place] from a country with a high rate of hepatitis A should be immunized.  Hepatitis B vaccine. Adults should be immunized if they wish to be protected from this disease, are under age 80 years and have diabetes, have chronic liver disease, have had more than one sex partner in the past 6 months, may be exposed to blood or other infectious body  fluids, are household contacts or sex partners of hepatitis B positive people, are clients or workers in certain care facilities, or travel to or work in countries with a high rate of hepatitis B.  Haemophilus influenzae type b (Hib) vaccine. A previously unvaccinated person with asplenia or sickle cell disease or having a scheduled splenectomy should receive 1 dose of Hib vaccine. Regardless of previous immunization, a recipient of a hematopoietic stem cell transplant should receive a 3-dose series 6-12 months after his successful transplant. Hib vaccine is not recommended for adults with HIV infection. Preventive Service / Frequency Ages 65 to 65  Blood pressure check.** / Every 3-5 years.  Lipid and cholesterol check.** / Every 5 years beginning at age 35.  Hepatitis C blood test.** / For any individual with known risks for hepatitis C.  Skin self-exam. / Monthly.  Influenza vaccine. / Every year.  Tetanus, diphtheria, and acellular pertussis (Tdap, Td) vaccine.** / Consult your health care provider. 1 dose of Td every 10 years.  Varicella vaccine.** / Consult your health care provider.  HPV vaccine. / 3 doses over 6 months, if 10 or younger.  Measles, mumps, rubella (MMR) vaccine.** / You need at least 1 dose of MMR if you were born in 1957 or later. You may also need a second dose.  Pneumococcal 13-valent conjugate (PCV13) vaccine.** / Consult your health care provider.  Pneumococcal polysaccharide (PPSV23) vaccine.** / 1 to 2 doses if you smoke cigarettes or if you have certain conditions.  Meningococcal vaccine.** / 1 dose if you are age 65 to 5 years and a Market researcher living in a residence hall, or have one of several medical conditions. You may also need additional booster doses.  Hepatitis A vaccine.** / Consult your health care provider.  Hepatitis B vaccine.** / Consult your health care provider.  Haemophilus influenzae type b (Hib) vaccine.** / Consult  your health care provider. Ages 48 to 67  Blood pressure check.** / Every year.  Lipid and cholesterol check.** / Every 5 years beginning at age 22.  Lung cancer screening. / Every year if you are aged 80-80 years and have a 30-pack-year history of smoking and currently smoke or have quit within the past 15 years. Yearly screening is stopped once you have quit smoking for at least 15 years or develop a health problem that would prevent you from having lung cancer treatment.  Fecal occult blood test (FOBT) of stool. / Every year beginning at age 82 and continuing until age 57. You may not have to do this test if you get a colonoscopy every 10 years.  Flexible sigmoidoscopy** or colonoscopy.** / Every 5 years for a flexible sigmoidoscopy or every 10 years for a colonoscopy beginning at age 51 and continuing until age 27.  Hepatitis C blood test.** / For all people born from [birth year] through 1965 and any individual with known risks for hepatitis C.  Skin self-exam. / Monthly.  Influenza vaccine. / Every year.  Tetanus, diphtheria, and acellular pertussis (Tdap/Td) vaccine.** / Consult your health care provider. 1 dose of Td every 10 years.  Varicella vaccine.** / Consult your health care provider.  Zoster vaccine.** / 1 dose for adults aged 51 years or older.  Measles, mumps, rubella (MMR) vaccine.** / You need at least 1 dose of MMR if you were born in 1957 or later. You may also need a second dose.  Pneumococcal 13-valent conjugate (PCV13) vaccine.** / Consult your health care provider.  Pneumococcal polysaccharide (PPSV23) vaccine.** / 1 to 2 doses if you smoke cigarettes or if you have certain conditions.  Meningococcal vaccine.** / Consult your health care provider.  Hepatitis A vaccine.** / Consult your health care provider.  Hepatitis B vaccine.** / Consult your health care provider.  Haemophilus influenzae type b (Hib) vaccine.** / Consult your health care provider. Ages 56 and  over  Blood pressure check.** / Every year.  Lipid and cholesterol check.**/ Every 5 years beginning at age 39.  Lung cancer screening. / Every year if you are aged 43-80 years and have a 30-pack-year history of smoking and currently smoke or have quit within the past 15 years. Yearly screening is stopped once you have quit smoking for at least 15 years or develop a health problem that would prevent you from having lung cancer treatment.  Fecal occult blood test (FOBT) of stool. / Every year beginning at age 56 and continuing until age 29. You may not have to do this test if you get a colonoscopy every 10 years.  Flexible sigmoidoscopy** or colonoscopy.** / Every 5 years for a flexible sigmoidoscopy or every 10 years for a colonoscopy beginning at age 55 and continuing until age 19.  Hepatitis C blood test.** / For all people born from [birth year] through 1967 and any individual with known risks for hepatitis C.  Abdominal aortic aneurysm (AAA) screening.** / A one-time screening for ages 73 to 38 years who are current or former smokers.  Skin self-exam. / Monthly.  Influenza vaccine. / Every year.  Tetanus, diphtheria, and acellular pertussis (Tdap/Td) vaccine.** / 1 dose of Td every 10 years.  Varicella vaccine.** / Consult your health care provider.  Zoster vaccine.** / 1 dose for adults aged 44 years or older.  Pneumococcal 13-valent conjugate (PCV13) vaccine.** / 1 dose for all adults aged 14 years and older.  Pneumococcal polysaccharide (PPSV23) vaccine.** / 1 dose for all adults aged 53 years and older.  Meningococcal vaccine.** / Consult your health care provider.  Hepatitis A vaccine.** / Consult your health care provider.  Hepatitis  B vaccine.** / Consult your health care provider.  Haemophilus influenzae type b (Hib) vaccine.** / Consult your health care provider. **Family history and personal history of risk and conditions may change your health care provider's  recommendations.   This information is not intended to replace advice given to you by your health care provider. Make sure you discuss any questions you have with your health care provider.   Document Released: 09/26/2001 Document Revised: 08/21/2014 Document Reviewed: 12/26/2010 Elsevier Interactive Patient Education [employer].

## 2015-11-29 NOTE — Assessment & Plan Note (Signed)
Discussion was had regarding risks and benefits of screening. Asymptomatic. Average risk. Patient elects to continue with PSA testing. Declines DRE.

## 2015-11-29 NOTE — Progress Notes (Signed)
Patient presents to clinic today for annual exam.  Patient is fasting for labs.  Acute Concerns: Denies acute concerns today.  Chronic Issues: Hypertension -- Endorses taking amlodipine daily as directed. Patient denies chest pain, palpitations, lightheadedness, dizziness, vision changes or frequent headaches.  BP Readings from Last 3 Encounters:  11/29/15 120/80  10/08/15 130/80  09/24/15 153/92   Hyperlipidemia -- Is taking Crestor daily as directed. Is tolerating well. Is working out 2-3 times per week. Is watching diet. Body mass index is 30.83 kg/(m^2).  Rheumatoid Arthritis -- Is followed by Rheumatology (Dr. Charlestine Night). Is taking Methotrexate and doing very well.  Health Maintenance: Immunizations -- up-to-date. Declines HIV screen. Has been married for many years. Denies concern for HIV. Colonoscopy -- Up-to-date.   Past Medical History  Diagnosis Date  . Hyperlipidemia     NMR LDL 140(1854/1324)TG 102,HDL 33  . Arthritis     rheumatoid arthritis variant; Dr Jolee Ewing  . Hypertension   . History of chicken pox   . GERD (gastroesophageal reflux disease)     Past Surgical History  Procedure Laterality Date  . Knee surgery      arthoscopic x2  . Mouth surgery      wisdom teeth & tooth removed from palate & re-implated  . Tonsillectomy    . Epidural block injection  2011-2014    ? 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 amLODipine (NORVASC) 5 MG tablet TAKE 1 TABLET (5 MG TOTAL) BY MOUTH DAILY. 90 tablet 3  . azelastine (ASTELIN) 0.1 % nasal spray Place 1 spray into both nostrils 2 (two) times daily. Use in each nostril as directed 30 mL 12  . fluticasone (FLONASE) 50 MCG/ACT nasal spray Place 2 sprays into both nostrils daily. 16 g 6  . folic acid (FOLVITE) A999333 MCG tablet Take 400 mcg by mouth daily.    . methotrexate (RHEUMATREX) 2.5 MG tablet Take 2.5 mg by mouth once  a week. Caution:Chemotherapy. Protect from light.     . Multiple Vitamin (MULTIVITAMIN) tablet Take 1 tablet by mouth daily.    Marland Kitchen omeprazole (PRILOSEC) 20 MG capsule Take 20 mg by mouth daily. Reported on 09/24/2015    . rosuvastatin (CRESTOR) 10 MG tablet Take 1 tablet (10 mg total) by mouth daily. 90 tablet 1  . acetaminophen (TYLENOL) 325 MG tablet Take 650 mg by mouth as needed. Reported on 11/29/2015     No current facility-administered medications on file prior to visit.    No Known Allergies  Family History  Problem Relation Age of Onset  . Arthritis Mother     ? type, ? not RA - Living  . Heart attack Father 67    MI ,died @ 58 (20rd MI)  . Heart attack Paternal Uncle 57    MI  . Arthritis Paternal Grandmother     ??RA  . Heart attack Paternal Grandfather 73    MI  . Lung cancer Maternal Grandfather     smoker  . Diabetes Neg Hx   . Stroke Neg Hx   . Colon cancer Neg Hx   . Colon polyps Neg Hx   . Rectal cancer Neg Hx   . Stomach cancer Neg Hx   . Heart attack Paternal Uncle   . Healthy Sister   . Asthma Son   . Allergies Son   . Healthy Son     #  2    Social History   Social History  . Marital Status: Married    Spouse Name: N/A  . Number of Children: 2  . Years of Education: N/A   Occupational History  . 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 Smoking status: Never Smoker   . Smokeless tobacco: Never Used  . Alcohol Use: No  . Drug Use: No  . Sexual Activity: Not on file   Other Topics Concern  . Not on file   Social History Narrative   Review of Systems  Constitutional: Negative for fever and weight loss.  HENT: Negative for ear discharge, ear pain, hearing loss and tinnitus.   Eyes: Negative for blurred vision, double vision, photophobia and pain.  Respiratory: Negative for cough and shortness of breath.   Cardiovascular: Negative for chest pain and palpitations.  Gastrointestinal: Negative for heartburn, nausea, vomiting,  abdominal pain, diarrhea, constipation, blood in stool and melena.  Genitourinary: Negative for dysuria, urgency, frequency, hematuria and flank pain.       Nocturia x 0-1  Musculoskeletal: Negative for falls.  Neurological: Negative for dizziness, loss of consciousness and headaches.  Endo/Heme/Allergies: Negative for environmental allergies.  Psychiatric/Behavioral: Negative for depression, suicidal ideas, hallucinations and substance abuse. The patient is not nervous/anxious and does not have insomnia.     BP 120/80 mmHg  Pulse 82  Temp(Src) 98.3 F (36.8 C) (Oral)  Ht 6' (1.829 m)  Wt 227 lb 6 oz (103.137 kg)  BMI 30.83 kg/m2  SpO2 95%  Physical Exam  Constitutional: He is oriented to person, place, and time and well-developed, well-nourished, and in no distress.  HENT:  Head: Normocephalic and atraumatic.  Right Ear: External ear normal.  Left Ear: External ear normal.  Nose: Nose normal.  Mouth/Throat: Oropharynx is clear and moist. No oropharyngeal exudate.  Eyes: Conjunctivae and EOM are normal. Pupils are equal, round, and reactive to light.  Neck: Neck supple. No thyromegaly present.  Cardiovascular: Normal rate, regular rhythm, normal heart sounds and intact distal pulses.   Pulmonary/Chest: Effort normal and breath sounds normal. No respiratory distress. He has no wheezes. He has no rales. He exhibits no tenderness.  Abdominal: Soft. Bowel sounds are normal. He exhibits no distension and no mass. There is no tenderness. There is no rebound and no guarding.  Genitourinary: Testes/scrotum normal.  Patient defers DRE examination after discussion of prostate cancer screening  Lymphadenopathy:    He has no cervical adenopathy.  Neurological: He is alert and oriented to person, place, and time.  Skin: Skin is warm and dry. No rash noted.  Scattered nevi noted of chest and back. No atypical nevi noted.   Psychiatric: Affect normal.  Vitals  reviewed.  Assessment/Plan: Visit for preventive health examination Depression screen negative. Health Maintenance reviewed -- immunizations up-to-date. Colonoscopy up-to-date. Declines HIV screen. Preventive schedule discussed and handout given in AVS. Will obtain fasting labs today.   Rheumatoid arthritis Followed by Dr. Charlestine Night. Will forward CBC and CMP results to specialist. Continue current regimen.   Hyperlipidemia Will repeat lipid panel and lft today. Will alter regimen based on results.  Essential hypertension Well controlled. Asymptomatic. Will continue current regimen.   Prostate cancer screening Discussion was had regarding risks and benefits of screening. Asymptomatic. Average risk. Patient elects to continue with PSA testing. Declines DRE.

## 2015-11-29 NOTE — Assessment & Plan Note (Signed)
Well controlled. Asymptomatic. Will continue current regimen.

## 2015-11-29 NOTE — Assessment & Plan Note (Signed)
Followed by Dr. Charlestine Night. Will forward CBC and CMP results to specialist. Continue current regimen.

## 2015-11-29 NOTE — Assessment & Plan Note (Signed)
Depression screen negative. Health Maintenance reviewed -- immunizations up-to-date. Colonoscopy up-to-date. Declines HIV screen. Preventive schedule discussed and handout given in AVS. Will obtain fasting labs today.

## 2015-11-29 NOTE — Progress Notes (Signed)
Pre visit review using our clinic review tool, if applicable. No additional management support is needed unless otherwise documented below in the visit note. 

## 2015-12-22 ENCOUNTER — Encounter: Payer: Self-pay | Admitting: Physician Assistant

## 2015-12-22 ENCOUNTER — Ambulatory Visit (INDEPENDENT_AMBULATORY_CARE_PROVIDER_SITE_OTHER): Payer: BLUE CROSS/BLUE SHIELD | Admitting: Physician Assistant

## 2015-12-22 VITALS — BP 118/82 | HR 67 | Temp 97.9°F | Resp 16 | Ht 72.0 in | Wt 230.1 lb

## 2015-12-22 DIAGNOSIS — R0989 Other specified symptoms and signs involving the circulatory and respiratory systems: Secondary | ICD-10-CM

## 2015-12-22 DIAGNOSIS — F458 Other somatoform disorders: Secondary | ICD-10-CM

## 2015-12-22 DIAGNOSIS — R09A2 Foreign body sensation, throat: Secondary | ICD-10-CM

## 2015-12-22 DIAGNOSIS — R198 Other specified symptoms and signs involving the digestive system and abdomen: Secondary | ICD-10-CM

## 2015-12-22 HISTORY — DX: Other specified symptoms and signs involving the circulatory and respiratory systems: R09.89

## 2015-12-22 HISTORY — DX: Other specified symptoms and signs involving the digestive system and abdomen: R19.8

## 2015-12-22 HISTORY — DX: Foreign body sensation, throat: R09.A2

## 2015-12-22 MED ORDER — OMEPRAZOLE 20 MG PO CPDR: 20.0000 mg | DELAYED_RELEASE_CAPSULE | Freq: Every day | ORAL | Status: DC

## 2015-12-22 NOTE — Patient Instructions (Signed)
Please restart the Prilosec daily (I have sent in a refill). Start a daily Probiotic (Digestive Advantage, Culturelle, Align, etc) Please stay well hydrated. Limit late night eating. Follow the regimen below for diet.  You will be contacted by GI for further assessment. If symptoms completely resolve with resumption of Prilosec then we can cancel this referral.  Food Choices for Gastroesophageal Reflux Disease, Adult When you have gastroesophageal reflux disease (GERD), the foods you eat and your eating habits are very important. Choosing the right foods can help ease the discomfort of GERD. WHAT GENERAL GUIDELINES DO I NEED TO FOLLOW?  Choose fruits, vegetables, whole grains, low-fat dairy products, and low-fat meat, fish, and poultry.  Limit fats such as oils, salad dressings, butter, nuts, and avocado.  Keep a food diary to identify foods that cause symptoms.  Avoid foods that cause reflux. These may be different for different people.  Eat frequent small meals instead of three large meals each day.  Eat your meals slowly, in a relaxed setting.  Limit fried foods.  Cook foods using methods other than frying.  Avoid drinking alcohol.  Avoid drinking large amounts of liquids with your meals.  Avoid bending over or lying down until 2-3 hours after eating. WHAT FOODS ARE NOT RECOMMENDED? The following are some foods and drinks that may worsen your symptoms: Vegetables Tomatoes. Tomato juice. Tomato and spaghetti sauce. Chili peppers. Onion and garlic. Horseradish. Fruits Oranges, grapefruit, and lemon (fruit and juice). Meats High-fat meats, fish, and poultry. This includes hot dogs, ribs, ham, sausage, salami, and bacon. Dairy Whole milk and chocolate milk. Sour cream. Cream. Butter. Ice cream. Cream cheese.  Beverages Coffee and tea, with or without caffeine. Carbonated beverages or energy drinks. Condiments Hot sauce. Barbecue sauce.  Sweets/Desserts Chocolate and  cocoa. Donuts. Peppermint and spearmint. Fats and Oils High-fat foods, including [place] fries and potato chips. Other Vinegar. Strong spices, such as black pepper, white pepper, red pepper, cayenne, curry powder, cloves, ginger, and chili powder. The items listed above may not be a complete list of foods and beverages to avoid. Contact your dietitian for more information.   This information is not intended to replace advice given to you by your health care provider. Make sure you discuss any questions you have with your health care provider.   Document Released: 07/31/2005 Document Revised: 08/21/2014 Document Reviewed: 06/04/2013 Elsevier Interactive Patient Education [employer].

## 2015-12-22 NOTE — Progress Notes (Signed)
Pre visit review using our clinic review tool, if applicable. No additional management support is needed unless otherwise documented below in the visit note/SLS  

## 2015-12-22 NOTE — Progress Notes (Signed)
Patient presents to clinic today c/o a sensation of something sitting in his throat over the past 3 weeks. Has history of GERD and is prescribed Omeprazole but has not taken in some time. Denies heart burn at present. Denies cough. Denies dysphagia of solid foods but notes some mild issue with fluids.  Denies abdominal pain, nausea or vomiting. Denies chest congestion or URI symptoms. Denies chest pain or SOB.  Past Medical History  Diagnosis Date  . Hyperlipidemia     NMR LDL 140(1854/1324)TG 102,HDL 33  . Arthritis     rheumatoid arthritis variant; Dr Jolee Ewing  . Hypertension   . History of chicken pox   . GERD (gastroesophageal reflux disease)     Current Outpatient Prescriptions on File Prior to Visit  Medication Sig Dispense Refill  . acetaminophen (TYLENOL) 325 MG tablet Take 650 mg by mouth as needed. Reported on 11/29/2015    . amLODipine (NORVASC) 5 MG tablet TAKE 1 TABLET (5 MG TOTAL) BY MOUTH DAILY. 90 tablet 3  . azelastine (ASTELIN) 0.1 % nasal spray Place 1 spray into both nostrils 2 (two) times daily. Use in each nostril as directed 30 mL 12  . fluticasone (FLONASE) 50 MCG/ACT nasal spray Place 2 sprays into both nostrils daily. (Patient taking differently: Place 2 sprays into both nostrils daily as needed. ) 16 g 6  . folic acid (FOLVITE) A999333 MCG tablet Take 400 mcg by mouth daily.    . methotrexate (RHEUMATREX) 2.5 MG tablet Take 2.5 mg by mouth once a week. Caution:Chemotherapy. Protect from light.     . Multiple Vitamin (MULTIVITAMIN) tablet Take 1 tablet by mouth daily.    . rosuvastatin (CRESTOR) 10 MG tablet Take 1 tablet (10 mg total) by mouth daily. 90 tablet 1  . omeprazole (PRILOSEC) 20 MG capsule Take 20 mg by mouth daily as needed. Reported on 12/22/2015     No current facility-administered medications on file prior to visit.    No Known Allergies  Family History  Problem Relation Age of Onset  . Arthritis Mother     ? type, ? not RA - Living  . Heart  attack Father 3    MI ,died @ 3 (29rd MI)  . Heart attack Paternal Uncle 28    MI  . Arthritis Paternal Grandmother     ??RA  . Heart attack Paternal Grandfather 69    MI  . Lung cancer Maternal Grandfather     smoker  . Diabetes Neg Hx   . Stroke Neg Hx   . Colon cancer Neg Hx   . Colon polyps Neg Hx   . Rectal cancer Neg Hx   . Stomach cancer Neg Hx   . Heart attack Paternal Uncle   . Healthy Sister   . Asthma Son   . Allergies Son   . Healthy Son     #2    Social History   Social History  . Marital Status: Married    Spouse Name: N/A  . Number of Children: 2  . Years of Education: N/A   Occupational History  . 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 Smoking status: Never Smoker   . Smokeless tobacco: Never Used  . Alcohol Use: No  . Drug Use: No  . Sexual Activity: Not Asked   Other Topics Concern  . None   Social History Narrative    Review of Systems - See HPI.  All other ROS are negative.  BP 118/82 mmHg  Pulse 67  Temp(Src) 97.9 F (36.6 C) (Oral)  Resp 16  Ht 6' (1.829 m)  Wt 230 lb 2 oz (104.384 kg)  BMI 31.20 kg/m2  SpO2 96%  Physical Exam  Constitutional: He is oriented to person, place, and time and well-developed, well-nourished, and in no distress.  HENT:  Head: Normocephalic and atraumatic.  Eyes: Conjunctivae are normal.  Cardiovascular: Normal rate, regular rhythm, normal heart sounds and intact distal pulses.   Pulmonary/Chest: Effort normal and breath sounds normal. No respiratory distress. He has no wheezes. He has no rales. He exhibits no tenderness.  Abdominal: Soft. Bowel sounds are normal. There is no tenderness.  Neurological: He is alert and oriented to person, place, and time.  Skin: Skin is warm and dry. No rash noted.  Psychiatric: Affect normal.  Vitals reviewed.   Recent Results (from the past 2160 hour(s))  CBC     Status: None   Collection Time: 11/29/15  9:17 AM  Result Value Ref Range   WBC  6.4 4.0 - 10.5 K/uL   RBC 5.06 4.22 - 5.81 Mil/uL   Platelets 194.0 150.0 - 400.0 K/uL   Hemoglobin 16.1 13.0 - 17.0 g/dL   HCT 47.3 39.0 - 52.0 %   MCV 93.6 78.0 - 100.0 fl   MCHC 34.0 30.0 - 36.0 g/dL   RDW 15.0 11.5 - 15.5 %  Hemoglobin A1c     Status: None   Collection Time: 11/29/15  9:17 AM  Result Value Ref Range   Hgb A1c MFr Bld 5.9 4.6 - 6.5 %    Comment: Glycemic Control Guidelines for People with Diabetes:Non Diabetic:  <6%Goal of Therapy: <7%Additional Action Suggested:  >8%   Lipid panel     Status: Abnormal   Collection Time: 11/29/15  9:17 AM  Result Value Ref Range   Cholesterol 135 0 - 200 mg/dL    Comment: ATP III Classification       Desirable:  < 200 mg/dL               Borderline High:  200 - 239 mg/dL          High:  > = 240 mg/dL   Triglycerides 116.0 0.0 - 149.0 mg/dL    Comment: Normal:  <150 mg/dLBorderline High:  150 - 199 mg/dL   HDL 36.30 (L) >39.00 mg/dL   VLDL 23.2 0.0 - 40.0 mg/dL   LDL Cholesterol 75 0 - 99 mg/dL   Total CHOL/HDL Ratio 4     Comment:                Men          Women1/2 Average Risk     3.4          3.3Average Risk          5.0          4.42X Average Risk          9.6          7.13X Average Risk          15.0          11.0                       NonHDL 98.44     Comment: NOTE:  Non-HDL goal should be 30 mg/dL higher than patient's LDL goal (i.e. LDL goal of < 70 mg/dL, would have non-HDL goal of < 100  mg/dL)  PSA     Status: None   Collection Time: 11/29/15  9:17 AM  Result Value Ref Range   PSA 0.65 0.10 - 4.00 ng/mL  Comprehensive metabolic panel     Status: Abnormal   Collection Time: 11/29/15  9:17 AM  Result Value Ref Range   Sodium 143 135 - 145 mEq/L   Potassium 3.6 3.5 - 5.1 mEq/L   Chloride 107 96 - 112 mEq/L   CO2 26 19 - 32 mEq/L   Glucose, Bld 123 (H) 70 - 99 mg/dL   BUN 14 6 - 23 mg/dL   Creatinine, Ser 1.14 0.40 - 1.50 mg/dL   Total Bilirubin 1.6 (H) 0.2 - 1.2 mg/dL   Alkaline Phosphatase 46 39 - 117 U/L   AST  29 0 - 37 U/L   ALT 42 0 - 53 U/L   Total Protein 6.8 6.0 - 8.3 g/dL   Albumin 4.5 3.5 - 5.2 g/dL   Calcium 9.7 8.4 - 10.5 mg/dL   GFR 71.83 >60.00 mL/min  TSH     Status: None   Collection Time: 11/29/15  9:17 AM  Result Value Ref Range   TSH 1.10 0.35 - 4.50 uIU/mL  Urinalysis, Routine w reflex microscopic (not at [facility])     Status: Abnormal   Collection Time: 11/29/15  9:17 AM  Result Value Ref Range   Color, Urine YELLOW Yellow;Lt. Yellow   APPearance CLEAR Clear   Specific Gravity, Urine 1.010 1.000-1.030   pH 6.0 5.0 - 8.0   Total Protein, Urine TRACE (A) Negative   Urine Glucose NEGATIVE Negative   Ketones, ur NEGATIVE Negative   Bilirubin Urine NEGATIVE Negative   Hgb urine dipstick TRACE-LYSED (A) Negative   Urobilinogen, UA 0.2 0.0 - 1.0   Leukocytes, UA NEGATIVE Negative   Nitrite NEGATIVE Negative   WBC, UA none seen 0-2/hpf   RBC / HPF 0-2/hpf 0-2/hpf   Squamous Epithelial / LPF Rare(0-4/hpf) Rare(0-4/hpf)    Assessment/Plan: 1. Globus sensation Feel secondary to silent reflux. Notes increased sensation with liquids only . Denies dysphagia of solid foods. Will restart PPI and daily probiotic. GERD diet reviewed and handout given. Referral placed to GI in case of continued symptoms as EGD would be warranted.   - omeprazole (PRILOSEC) 20 MG capsule; Take 1 capsule (20 mg total) by mouth daily. Reported on 12/22/2015  Dispense: 30 capsule; Refill: 3 - Referral to GI

## 2016-03-16 DIAGNOSIS — M545 Low back pain: Secondary | ICD-10-CM | POA: Diagnosis not present

## 2016-03-16 DIAGNOSIS — M0570 Rheumatoid arthritis with rheumatoid factor of unspecified site without organ or systems involvement: Secondary | ICD-10-CM | POA: Diagnosis not present

## 2016-03-16 DIAGNOSIS — R7989 Other specified abnormal findings of blood chemistry: Secondary | ICD-10-CM | POA: Diagnosis not present

## 2016-03-16 DIAGNOSIS — Z79899 Other long term (current) drug therapy: Secondary | ICD-10-CM | POA: Diagnosis not present

## 2016-03-27 DIAGNOSIS — M545 Low back pain: Secondary | ICD-10-CM | POA: Diagnosis not present

## 2016-03-27 DIAGNOSIS — M25561 Pain in right knee: Secondary | ICD-10-CM | POA: Diagnosis not present

## 2016-04-04 LAB — LIPID PANEL
CHOLESTEROL: 155 mg/dL (ref 0–200)
HDL: 34 mg/dL — AB (ref 35–70)
LDL Cholesterol: 79 mg/dL
TRIGLYCERIDES: 208 mg/dL — AB (ref 40–160)

## 2016-04-04 LAB — CBC AND DIFFERENTIAL
HCT: 49 % (ref 41–53)
HEMOGLOBIN: 16.8 g/dL (ref 13.5–17.5)
PLATELETS: 151 10*3/uL (ref 150–399)
WBC: 6.9 10^3/mL

## 2016-04-04 LAB — BASIC METABOLIC PANEL
BUN: 11 mg/dL (ref 4–21)
CREATININE: 0.8 mg/dL (ref 0.6–1.3)
Glucose: 120 mg/dL
POTASSIUM: 3.8 mmol/L (ref 3.4–5.3)
Sodium: 141 mmol/L (ref 137–147)

## 2016-04-04 LAB — HEPATIC FUNCTION PANEL
ALK PHOS: 49 U/L (ref 25–125)
ALT: 45 U/L — AB (ref 10–40)
AST: 23 U/L (ref 14–40)
BILIRUBIN, TOTAL: 1.3 mg/dL
Bilirubin, Direct: 0.3 mg/dL (ref 0.01–0.4)

## 2016-04-04 LAB — HEMOGLOBIN A1C: Hemoglobin A1C: 6.4

## 2016-04-29 ENCOUNTER — Other Ambulatory Visit: Payer: Self-pay | Admitting: Physician Assistant

## 2016-04-29 DIAGNOSIS — E785 Hyperlipidemia, unspecified: Secondary | ICD-10-CM

## 2016-06-18 NOTE — Progress Notes (Signed)
Erroneous encounter

## 2016-06-21 ENCOUNTER — Telehealth: Payer: Self-pay | Admitting: Physician Assistant

## 2016-06-21 NOTE — Telephone Encounter (Signed)
Okay, arrange at his convenience

## 2016-06-21 NOTE — Telephone Encounter (Signed)
Patient is requesting to transfer care to Dr. Larose Kells from [facility]. Please advise.

## 2016-06-26 NOTE — Telephone Encounter (Signed)
Patient scheduled CPE 12/05/16

## 2016-06-30 DIAGNOSIS — M545 Low back pain: Secondary | ICD-10-CM | POA: Diagnosis not present

## 2016-06-30 DIAGNOSIS — Z79899 Other long term (current) drug therapy: Secondary | ICD-10-CM | POA: Diagnosis not present

## 2016-06-30 DIAGNOSIS — M0570 Rheumatoid arthritis with rheumatoid factor of unspecified site without organ or systems involvement: Secondary | ICD-10-CM | POA: Diagnosis not present

## 2016-06-30 DIAGNOSIS — R7989 Other specified abnormal findings of blood chemistry: Secondary | ICD-10-CM | POA: Diagnosis not present

## 2016-08-16 ENCOUNTER — Other Ambulatory Visit: Payer: Self-pay | Admitting: Physician Assistant

## 2016-08-16 DIAGNOSIS — Z79899 Other long term (current) drug therapy: Secondary | ICD-10-CM | POA: Diagnosis not present

## 2016-08-16 DIAGNOSIS — R7989 Other specified abnormal findings of blood chemistry: Secondary | ICD-10-CM | POA: Diagnosis not present

## 2016-08-16 DIAGNOSIS — E785 Hyperlipidemia, unspecified: Secondary | ICD-10-CM

## 2016-08-16 DIAGNOSIS — M7662 Achilles tendinitis, left leg: Secondary | ICD-10-CM | POA: Diagnosis not present

## 2016-08-16 DIAGNOSIS — M0570 Rheumatoid arthritis with rheumatoid factor of unspecified site without organ or systems involvement: Secondary | ICD-10-CM | POA: Diagnosis not present

## 2016-09-01 DIAGNOSIS — M545 Low back pain: Secondary | ICD-10-CM | POA: Diagnosis not present

## 2016-09-12 ENCOUNTER — Ambulatory Visit: Payer: BLUE CROSS/BLUE SHIELD | Admitting: Physical Therapy

## 2016-09-21 DIAGNOSIS — M545 Low back pain: Secondary | ICD-10-CM | POA: Diagnosis not present

## 2016-10-27 LAB — LIPID PANEL
CHOLESTEROL: 106 mg/dL (ref 0–200)
CHOLESTEROL: 106 mg/dL (ref 0–200)
HDL: 35 mg/dL (ref 35–70)
HDL: 35 mg/dL (ref 35–70)
LDL Cholesterol: 47 mg/dL
LDL Cholesterol: 47 mg/dL
TRIGLYCERIDES: 119 mg/dL (ref 40–160)
TRIGLYCERIDES: 119 mg/dL (ref 40–160)

## 2016-10-27 LAB — HEMOGLOBIN A1C: HEMOGLOBIN A1C: 5.7

## 2016-12-05 ENCOUNTER — Ambulatory Visit (INDEPENDENT_AMBULATORY_CARE_PROVIDER_SITE_OTHER): Payer: BLUE CROSS/BLUE SHIELD | Admitting: Internal Medicine

## 2016-12-05 ENCOUNTER — Encounter: Payer: Self-pay | Admitting: Internal Medicine

## 2016-12-05 VITALS — BP 122/74 | HR 61 | Temp 97.7°F | Resp 14 | Ht 72.0 in | Wt 228.2 lb

## 2016-12-05 DIAGNOSIS — Z114 Encounter for screening for human immunodeficiency virus [HIV]: Secondary | ICD-10-CM | POA: Diagnosis not present

## 2016-12-05 DIAGNOSIS — Z8249 Family history of ischemic heart disease and other diseases of the circulatory system: Secondary | ICD-10-CM

## 2016-12-05 DIAGNOSIS — Z09 Encounter for follow-up examination after completed treatment for conditions other than malignant neoplasm: Secondary | ICD-10-CM | POA: Insufficient documentation

## 2016-12-05 DIAGNOSIS — Z0000 Encounter for general adult medical examination without abnormal findings: Secondary | ICD-10-CM

## 2016-12-05 DIAGNOSIS — M069 Rheumatoid arthritis, unspecified: Secondary | ICD-10-CM

## 2016-12-05 LAB — COMPREHENSIVE METABOLIC PANEL
ALT: 27 U/L (ref 0–53)
AST: 19 U/L (ref 0–37)
Albumin: 4.4 g/dL (ref 3.5–5.2)
Alkaline Phosphatase: 48 U/L (ref 39–117)
BILIRUBIN TOTAL: 1 mg/dL (ref 0.2–1.2)
BUN: 15 mg/dL (ref 6–23)
CALCIUM: 8.8 mg/dL (ref 8.4–10.5)
CHLORIDE: 107 meq/L (ref 96–112)
CO2: 29 meq/L (ref 19–32)
Creatinine, Ser: 0.88 mg/dL (ref 0.40–1.50)
GFR: 96.45 mL/min (ref >60.00)
Glucose, Bld: 135 mg/dL — ABNORMAL HIGH (ref 70–99)
Potassium: 3.7 mEq/L (ref 3.5–5.1)
Sodium: 141 mEq/L (ref 135–145)
Total Protein: 6.4 g/dL (ref 6.0–8.3)

## 2016-12-05 LAB — CBC WITH DIFFERENTIAL/PLATELET
BASOS ABS: 0 10*3/uL (ref 0.0–0.1)
Basophils Relative: 0.9 % (ref 0.0–3.0)
Eosinophils Absolute: 0.1 10*3/uL (ref 0.0–0.7)
Eosinophils Relative: 2.5 % (ref 0.0–5.0)
HEMATOCRIT: 48.8 % (ref 39.0–52.0)
HEMOGLOBIN: 16.3 g/dL (ref 13.0–17.0)
LYMPHS PCT: 28.1 % (ref 12.0–46.0)
Lymphs Abs: 1.6 10*3/uL (ref 0.7–4.0)
MCHC: 33.3 g/dL (ref 30.0–36.0)
MCV: 94.1 fl (ref 78.0–100.0)
MONOS PCT: 7.6 % (ref 3.0–12.0)
Monocytes Absolute: 0.4 10*3/uL (ref 0.1–1.0)
NEUTROS ABS: 3.5 10*3/uL (ref 1.4–7.7)
Neutrophils Relative %: 60.9 % (ref 43.0–77.0)
PLATELETS: 187 10*3/uL (ref 150.0–400.0)
RBC: 5.19 Mil/uL (ref 4.22–5.81)
RDW: 14.6 % (ref 11.5–15.5)
WBC: 5.7 10*3/uL (ref 4.0–10.5)

## 2016-12-05 LAB — HIV ANTIBODY (ROUTINE TESTING W REFLEX): HIV 1&2 Ab, 4th Generation: NONREACTIVE

## 2016-12-05 NOTE — Patient Instructions (Addendum)
GO TO THE LAB : Get the blood work     GO TO THE FRONT DESK Schedule your next appointment for a  routine checkup in 6 months    Aspirin 81 mg one tablet daily  We are referring you to a new rheumatologist

## 2016-12-05 NOTE — Assessment & Plan Note (Signed)
Prediabetes: Initial A1c 6.4, follow up A1c after change in diet was 5.9. Doing well HTN: Controlled, continue amlodipine High cholesterol: On Crestor, last LDL 47. Rheumatoid arthritis: On a low dose of MTX, needs a new rheumatologist, Dr. Charlestine Night retired. Referral sent RTC 6 months

## 2016-12-05 NOTE — Progress Notes (Signed)
Pre visit review using our clinic review tool, if applicable. No additional management support is needed unless otherwise documented below in the visit note. 

## 2016-12-05 NOTE — Progress Notes (Signed)
Subjective:    Patient ID: Robert Velazquez, male    DOB: 05/16/1966, 51 y.o.   MRN: [id number]  DOS:  12/05/2016 Type of visit - description : new pt to me , CPX Interval history: No major concerns, needs a new rheumatologist   Review of Systems Exercises regularly, trying to lose weight. Has arthritis, aches and pains not severe, at baseline, on low dose of methotrexate. Occasionally has "tailbone" pain when he sits for long time. No injury   Other than above, a 14 point review of systems is negative   Past Medical History:  Diagnosis Date  . Arthritis    rheumatoid arthritis variant; Dr Jolee Ewing  . GERD (gastroesophageal reflux disease)   . History of chicken pox   . Hyperlipidemia    NMR LDL 140(1854/1324)TG 102,HDL 33  . Hypertension     Past Surgical History:  Procedure Laterality Date  . EPIDURAL BLOCK INJECTION  2011-2014   ? 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 KNEE SURGERY     arthoscopic x2  . MOUTH SURGERY     wisdom teeth & tooth removed from palate & re-implated  . TONSILLECTOMY      Social History   Social History  . Marital status: Married    Spouse name: N/A  . Number of children: 2  . Years of education: N/A   Occupational History  . [employer]   Social History Main Topics  . Smoking status: Never Smoker  . Smokeless tobacco: Never Used  . Alcohol use No  . Drug use: No  . Sexual activity: Not on file   Other Topics Concern  . Not on file   Social History Narrative   Household- pt, wife, 2 children     Family History  Problem Relation Age of Onset  . Heart attack Father 4    MI ,died @ 22 (18rd MI)  . Arthritis Mother     ? type, ? not RA - Living  . Heart attack Paternal Uncle 68    MI  . Arthritis Paternal Grandmother     ??RA  . Heart attack Paternal Grandfather 24    MI  . Lung cancer Maternal Grandfather     smoker  . Heart attack Paternal Uncle   . Healthy Sister   . Asthma Son   .  Allergies Son   . Healthy Son     #2  . Diabetes Neg Hx   . Stroke Neg Hx   . Colon cancer Neg Hx   . Colon polyps Neg Hx   . Rectal cancer Neg Hx   . Stomach cancer Neg Hx   . Prostate cancer Neg Hx      Allergies as of 12/05/2016   No Known Allergies     Medication List       Accurate as of 12/05/16  6:51 PM. Always use your most recent med list.          acetaminophen 325 MG tablet Commonly known as:  TYLENOL Take 650 mg by mouth as needed. Reported on 11/29/2015   amLODipine 5 MG tablet Commonly known as:  NORVASC TAKE 1 TABLET (5 MG TOTAL) BY MOUTH DAILY.   aspirin EC 81 MG tablet Take 81 mg by mouth daily.   azelastine 0.1 % nasal spray Commonly known as:  ASTELIN Place 1 spray into both nostrils 2 (two) times daily. Use in each nostril as directed   fluticasone  50 MCG/ACT nasal spray Commonly known as:  FLONASE Place 2 sprays into both nostrils daily.   folic acid 027 MCG tablet Commonly known as:  FOLVITE Take 400 mcg by mouth daily.   methotrexate 2.5 MG tablet Commonly known as:  RHEUMATREX Take 2.5 mg by mouth once a week. Caution:Chemotherapy. Protect from light.   multivitamin tablet Take 1 tablet by mouth daily.   omeprazole 20 MG capsule Commonly known as:  PRILOSEC Take 1 capsule (20 mg total) by mouth daily. Reported on 12/22/2015   rosuvastatin 10 MG tablet Commonly known as:  CRESTOR TAKE 1 TABLET (10 MG TOTAL) BY MOUTH DAILY.          Objective:   Physical Exam BP 122/74 (BP Location: Left Arm, Patient Position: Sitting, Cuff Size: Normal)   Pulse 61   Temp 97.7 F (36.5 C) (Oral)   Resp 14   Ht 6' (1.829 m)   Wt 228 lb 4 oz (103.5 kg)   SpO2 98%   BMI 30.96 kg/m   General:   Well developed, well nourished . NAD.  Neck: No  thyromegaly  HEENT:  Normocephalic . Face symmetric, atraumatic Lungs:  CTA B Normal respiratory effort, no intercostal retractions, no accessory muscle use. Heart: RRR,  no murmur.  No  pretibial edema bilaterally  Abdomen:  Not distended, soft, non-tender. No rebound or rigidity.   Skin: Exposed areas without rash. Not pale. Not jaundice MSK: No synovitis at the wrists or hands Neurologic:  alert & oriented X3.  Speech normal, gait appropriate for age and unassisted Strength symmetric and appropriate for age.  Psych: Cognition and judgment appear intact.  Cooperative with normal attention span and concentration.  Behavior appropriate. No anxious or depressed appearing.    Assessment & Plan:    Assessment (transfer to me 11-2016) Pre-diabetes: A1c 6.4 (03-2016) HTN Hyperlipidemia R.A. Dx ~ mid 6s  Plan: Prediabetes: Initial A1c 6.4, follow up A1c after change in diet was 5.9. Doing well HTN: Controlled, continue amlodipine High cholesterol: On Crestor, last LDL 47. Rheumatoid arthritis: On a low dose of MTX, needs a new rheumatologist, Dr. Charlestine Night retired. Referral sent RTC 6 months

## 2016-12-05 NOTE — Assessment & Plan Note (Addendum)
--  Td 2014 --CCS: Cscope 2016 --Prostate cancer screening: PSAs wnl, last 11-2015 --Diet: He is trying to do better, recommend calorie counting. He exercises regularly around 3 times a week --Labs: CMP, CBC, HIV --+ FH CAD: Patient concern, negative stress test 2013. Continue controlling cardiovascular risk factors, add aspirin 81, EKG today: NSR. Will repeat a stress test  RTC 6 months

## 2016-12-06 ENCOUNTER — Other Ambulatory Visit: Payer: Self-pay | Admitting: Physician Assistant

## 2016-12-06 DIAGNOSIS — E785 Hyperlipidemia, unspecified: Secondary | ICD-10-CM

## 2016-12-11 ENCOUNTER — Telehealth: Payer: Self-pay

## 2016-12-11 MED ORDER — METHOTREXATE 2.5 MG PO TABS: ORAL_TABLET | ORAL | 0 refills | Status: DC

## 2016-12-11 NOTE — Telephone Encounter (Signed)
Okay to prescribe methotrexate 2.5 mg 1 tablet weekly;  one-month supply and 3 refills until he sees his new rheumatologist. Please confirm dose with the patient

## 2016-12-11 NOTE — Telephone Encounter (Signed)
Received medication refill request from [facility], Pt needing Methotrexate 2.5mg  tabs-per directions he takes 6 tablets by mouth every Thursday as directed. Pt is pending new referral to Dr. Gerilyn Nestle [facility] Rheumatologist) from Dr. Charlestine Night, appt not until 02/2017. Please advise.

## 2016-12-11 NOTE — Telephone Encounter (Signed)
Spoke w/ Pt, he takes 6 tabs weekly, Rx sent.

## 2016-12-20 ENCOUNTER — Other Ambulatory Visit: Payer: Self-pay | Admitting: Physician Assistant

## 2016-12-20 DIAGNOSIS — R0989 Other specified symptoms and signs involving the circulatory and respiratory systems: Secondary | ICD-10-CM

## 2016-12-20 DIAGNOSIS — R198 Other specified symptoms and signs involving the digestive system and abdomen: Secondary | ICD-10-CM

## 2017-03-12 DIAGNOSIS — Z79899 Other long term (current) drug therapy: Secondary | ICD-10-CM | POA: Diagnosis not present

## 2017-03-12 DIAGNOSIS — M0579 Rheumatoid arthritis with rheumatoid factor of multiple sites without organ or systems involvement: Secondary | ICD-10-CM | POA: Diagnosis not present

## 2017-03-15 ENCOUNTER — Other Ambulatory Visit: Payer: Self-pay | Admitting: Physician Assistant

## 2017-03-15 DIAGNOSIS — I10 Essential (primary) hypertension: Secondary | ICD-10-CM

## 2017-03-15 NOTE — Telephone Encounter (Signed)
Will defer further refills of patient's medications to PCP  

## 2017-04-13 LAB — LIPID PANEL
Cholesterol: 112 (ref 0–200)
HDL: 35 (ref 35–70)
LDL CALC: 56
LDl/HDL Ratio: 3.2
Triglycerides: 107 (ref 40–160)

## 2017-04-13 LAB — CBC AND DIFFERENTIAL
HCT: 48 (ref 41–53)
Hemoglobin: 16.8 (ref 13.5–17.5)
PLATELETS: 160 (ref 150–399)
WBC: 5

## 2017-04-13 LAB — BASIC METABOLIC PANEL
BUN: 10 (ref 4–21)
CREATININE: 1 (ref 0.6–1.3)
Glucose: 133
Potassium: 4.1 (ref 3.4–5.3)
Sodium: 148 — AB (ref 137–147)

## 2017-04-13 LAB — PSA: PSA: 0.5

## 2017-04-13 LAB — HEPATIC FUNCTION PANEL
ALK PHOS: 60 (ref 25–125)
ALT: 24 (ref 10–40)
AST: 23 (ref 14–40)
BILIRUBIN DIRECT: 0.28 (ref 0.01–0.4)
Bilirubin, Total: 1.2

## 2017-04-13 LAB — HEMOGLOBIN A1C: Hemoglobin A1C: 5.6

## 2017-04-19 ENCOUNTER — Telehealth: Payer: Self-pay | Admitting: *Deleted

## 2017-04-19 NOTE — Telephone Encounter (Signed)
Received results from [place]; forwarded to provider/SLS 09/06

## 2017-04-26 ENCOUNTER — Encounter: Payer: Self-pay | Admitting: Internal Medicine

## 2017-04-26 ENCOUNTER — Telehealth: Payer: Self-pay | Admitting: *Deleted

## 2017-04-26 NOTE — Telephone Encounter (Signed)
Received results from [facility]; forwarded to provider/SLS 09/13

## 2017-06-07 ENCOUNTER — Encounter: Payer: Self-pay | Admitting: Internal Medicine

## 2017-06-07 ENCOUNTER — Ambulatory Visit (INDEPENDENT_AMBULATORY_CARE_PROVIDER_SITE_OTHER): Payer: BLUE CROSS/BLUE SHIELD | Admitting: Internal Medicine

## 2017-06-07 VITALS — BP 118/60 | HR 58 | Temp 97.8°F | Resp 14 | Ht 72.0 in | Wt 232.1 lb

## 2017-06-07 DIAGNOSIS — R0981 Nasal congestion: Secondary | ICD-10-CM

## 2017-06-07 DIAGNOSIS — Z23 Encounter for immunization: Secondary | ICD-10-CM | POA: Diagnosis not present

## 2017-06-07 DIAGNOSIS — I10 Essential (primary) hypertension: Secondary | ICD-10-CM | POA: Diagnosis not present

## 2017-06-07 DIAGNOSIS — M069 Rheumatoid arthritis, unspecified: Secondary | ICD-10-CM

## 2017-06-07 MED ORDER — FLUTICASONE PROPIONATE 50 MCG/ACT NA SUSP: 2.0000 | Freq: Every day | NASAL | 12 refills | Status: DC | PRN

## 2017-06-07 MED ORDER — AZELASTINE HCL 0.1 % NA SOLN: 1.0000 | Freq: Two times a day (BID) | NASAL | 12 refills | Status: DC

## 2017-06-07 NOTE — Progress Notes (Signed)
Subjective:    Patient ID: Robert Velazquez, male    DOB: [birth year], 51 y.o.   MRN: [id number]  DOS:  06/07/2017 Type of visit - description : rov Interval history: HTN: Good compliance of medication, ambulatory BPs often times are > 140/80, when she he goes to a doctor is wnl so he stopped checking. Allergies: On and off nasal congestion for the last 2 months, previously seen by ENT and prescribe Flonase and Astelin, he self restarted sprays left over and feeling better. Rheumatoid arthritis: Note from rheumatology reviewed  Review of Systems Right knee pain, previous surgeries there, plans to see Dr. Rhona Raider  Past Medical History:  Diagnosis Date  . Arthritis    rheumatoid arthritis variant; Dr Jolee Ewing  . GERD (gastroesophageal reflux disease)   . History of chicken pox   . Hyperlipidemia    NMR LDL 140(1854/1324)TG 102,HDL 33  . Hypertension     Past Surgical History:  Procedure Laterality Date  . EPIDURAL BLOCK INJECTION  2011-2014   ? 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 KNEE SURGERY     arthoscopic x2  . MOUTH SURGERY     wisdom teeth & tooth removed from palate & re-implated  . TONSILLECTOMY      Social History   Social History  . Marital status: Married    Spouse name: N/A  . Number of children: 2  . Years of education: N/A   Occupational History  . [employer]   Social History Main Topics  . Smoking status: Never Smoker  . Smokeless tobacco: Never Used  . Alcohol use No  . Drug use: No  . Sexual activity: Not on file   Other Topics Concern  . Not on file   Social History Narrative   Household- pt, wife, 2 children      Allergies as of 06/07/2017   No Known Allergies     Medication List       Accurate as of 06/07/17 11:59 PM. Always use your most recent med list.          acetaminophen 325 MG tablet Commonly known as:  TYLENOL Take 650 mg by mouth as needed. Reported on 11/29/2015   amLODipine 5 MG  tablet Commonly known as:  NORVASC Take 1 tablet (5 mg total) by mouth daily.   aspirin EC 81 MG tablet Take 81 mg by mouth daily.   azelastine 0.1 % nasal spray Commonly known as:  ASTELIN Place 1 spray into both nostrils 2 (two) times daily. Use in each nostril as directed   fluticasone 50 MCG/ACT nasal spray Commonly known as:  FLONASE Place 2 sprays into both nostrils daily as needed for allergies or rhinitis.   folic acid 250 MCG tablet Commonly known as:  FOLVITE Take 400 mcg by mouth daily.   methotrexate 2.5 MG tablet Commonly known as:  RHEUMATREX Take 6 tablets by mouth weekly as directed.   multivitamin tablet Take 1 tablet by mouth daily.   omeprazole 20 MG capsule Commonly known as:  PRILOSEC Take 1 capsule (20 mg total) by mouth daily.   rosuvastatin 10 MG tablet Commonly known as:  CRESTOR Take 1 tablet (10 mg total) by mouth daily.          Objective:   Physical Exam BP 118/60 (BP Location: Left Arm, Patient Position: Sitting, Cuff Size: Normal)   Pulse (!) 58   Temp 97.8 F (36.6 C) (Oral)   Resp  14   Ht 6' (1.829 m)   Wt 232 lb 2 oz (105.3 kg)   SpO2 98%   BMI 31.48 kg/m  General:   Well developed, well nourished . NAD.  HEENT:  Normocephalic . Face symmetric, atraumatic Lungs:  CTA B Normal respiratory effort, no intercostal retractions, no accessory muscle use. Heart: RRR,  no murmur.  No pretibial edema bilaterally  Skin: Not pale. Not jaundice Neurologic:  alert & oriented X3.  Speech normal, gait appropriate for age and unassisted Psych--  Cognition and judgment appear intact.  Cooperative with normal attention span and concentration.  Behavior appropriate. No anxious or depressed appearing.      Assessment & Plan:   Assessment (transfer to me 11-2016) Pre-diabetes: A1c 6.4 (03-2016) HTN Hyperlipidemia R.A. Dx ~ mid 53s  Plan: Labs from 02-2017 reviewed: CBC normal, platelets 159. CMP: Creatinine 0.8, LFTs  normal. Prediabetes: Last A1c satisfactory. Diet and exercise discussed HTN on amlodipine, lasts renal function normal. Ambulatory BP slightly elevated, he checks with the electronic monitor. Always normal  when he sees a provider. No change. R A: His new rheumatologist is trying to decrease methotrexate dose. Allergies: Refill Astelin, Flonase RTC 6-8 m, CPX

## 2017-06-07 NOTE — Patient Instructions (Signed)
  GO TO THE FRONT DESK Schedule your next appointment for a  Physical in 6-8 months

## 2017-06-07 NOTE — Progress Notes (Signed)
Pre visit review using our clinic review tool, if applicable. No additional management support is needed unless otherwise documented below in the visit note. 

## 2017-06-08 NOTE — Assessment & Plan Note (Signed)
Labs from 02-2017 reviewed: CBC normal, platelets 159. CMP: Creatinine 0.8, LFTs normal. Prediabetes: Last A1c satisfactory. Diet and exercise discussed HTN on amlodipine, lasts renal function normal. Ambulatory BP slightly elevated, he checks with the electronic monitor. Always normal  when he sees a provider. No change. R A: His new rheumatologist is trying to decrease methotrexate dose. Allergies: Refill Astelin, Flonase RTC 6-8 m, CPX

## 2017-06-26 DIAGNOSIS — J029 Acute pharyngitis, unspecified: Secondary | ICD-10-CM | POA: Diagnosis not present

## 2017-06-26 DIAGNOSIS — J028 Acute pharyngitis due to other specified organisms: Secondary | ICD-10-CM | POA: Diagnosis not present

## 2017-06-26 DIAGNOSIS — B9789 Other viral agents as the cause of diseases classified elsewhere: Secondary | ICD-10-CM | POA: Diagnosis not present

## 2017-06-26 DIAGNOSIS — J069 Acute upper respiratory infection, unspecified: Secondary | ICD-10-CM | POA: Diagnosis not present

## 2017-06-28 DIAGNOSIS — M0579 Rheumatoid arthritis with rheumatoid factor of multiple sites without organ or systems involvement: Secondary | ICD-10-CM | POA: Diagnosis not present

## 2017-06-28 DIAGNOSIS — Z79899 Other long term (current) drug therapy: Secondary | ICD-10-CM | POA: Diagnosis not present

## 2017-09-26 ENCOUNTER — Other Ambulatory Visit: Payer: Self-pay | Admitting: Internal Medicine

## 2017-09-26 DIAGNOSIS — M0579 Rheumatoid arthritis with rheumatoid factor of multiple sites without organ or systems involvement: Secondary | ICD-10-CM | POA: Diagnosis not present

## 2017-09-26 DIAGNOSIS — M5441 Lumbago with sciatica, right side: Secondary | ICD-10-CM | POA: Diagnosis not present

## 2017-09-26 DIAGNOSIS — E785 Hyperlipidemia, unspecified: Secondary | ICD-10-CM

## 2017-09-26 DIAGNOSIS — I10 Essential (primary) hypertension: Secondary | ICD-10-CM

## 2017-09-26 DIAGNOSIS — G8929 Other chronic pain: Secondary | ICD-10-CM | POA: Diagnosis not present

## 2017-09-26 DIAGNOSIS — Z79899 Other long term (current) drug therapy: Secondary | ICD-10-CM | POA: Diagnosis not present

## 2017-11-15 LAB — LIPID PANEL
Cholesterol: 119 (ref 0–200)
HDL: 34 — AB (ref 35–70)
LDL Cholesterol: 50
LDl/HDL Ratio: 3.5
TRIGLYCERIDES: 173 — AB (ref 40–160)

## 2017-11-15 LAB — BASIC METABOLIC PANEL: Glucose: 129

## 2017-11-20 ENCOUNTER — Encounter: Payer: Self-pay | Admitting: Internal Medicine

## 2017-12-11 ENCOUNTER — Ambulatory Visit (INDEPENDENT_AMBULATORY_CARE_PROVIDER_SITE_OTHER): Payer: BLUE CROSS/BLUE SHIELD | Admitting: Internal Medicine

## 2017-12-11 ENCOUNTER — Encounter: Payer: Self-pay | Admitting: Internal Medicine

## 2017-12-11 VITALS — BP 126/70 | HR 55 | Temp 97.6°F | Resp 14 | Ht 72.0 in | Wt 232.4 lb

## 2017-12-11 DIAGNOSIS — Z0000 Encounter for general adult medical examination without abnormal findings: Secondary | ICD-10-CM

## 2017-12-11 DIAGNOSIS — Z23 Encounter for immunization: Secondary | ICD-10-CM | POA: Diagnosis not present

## 2017-12-11 DIAGNOSIS — Z79899 Other long term (current) drug therapy: Secondary | ICD-10-CM

## 2017-12-11 LAB — HEMOGLOBIN A1C: Hgb A1c MFr Bld: 6.1 % (ref 4.6–6.5)

## 2017-12-11 NOTE — Assessment & Plan Note (Signed)
--  Td 2014; pnm 23 today --CCS: Cscope 2016 --Prostate cancer screening: PSAs wnl, last PSA was 03-2017 --Counseled about diet and exercise --Available labs reviewed, due for A1c --+ FH CAD: Asymptomatic, controlling CVR if, on aspirin. RTC 1 year

## 2017-12-11 NOTE — Progress Notes (Signed)
Subjective:    Patient ID: Robert Velazquez, male    DOB: 1966/02/13, 52 y.o.   MRN: [id number]  DOS:  12/11/2017 Type of visit - description : cpx Interval history: In general feeling well. Rheumatoid arthritis: Currently well controlled.   Review of Systems  Other than above, a 14 point review of systems is negative     Past Medical History:  Diagnosis Date  . Arthritis    rheumatoid arthritis variant; Dr Jolee Ewing  . GERD (gastroesophageal reflux disease)   . History of chicken pox   . Hyperlipidemia    NMR LDL 140(1854/1324)TG 102,HDL 33  . Hypertension     Past Surgical History:  Procedure Laterality Date  . EPIDURAL BLOCK INJECTION  2011-2014   ? 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 KNEE SURGERY     arthoscopic x2  . MOUTH SURGERY     wisdom teeth & tooth removed from palate & re-implated  . TONSILLECTOMY      Social History   Socioeconomic History  . Marital status: Married    Spouse name: Not on file  . Number of children: 2  . Years of education: Not on file  . Highest education level: Not on file  Occupational History  . Occupation: MARKETING AND Scientist, clinical (histocompatibility and immunogenetics): [employer]  Social Needs  . Financial resource strain: Not on file  . Food insecurity:    Worry: Not on file    Inability: Not on file  . Transportation needs:    Medical: Not on file    Non-medical: Not on file  Tobacco Use  . Smoking status: Never Smoker  . Smokeless tobacco: Never Used  Substance and Sexual Activity  . Alcohol use: No  . Drug use: No  . Sexual activity: Not on file  Lifestyle  . Physical activity:    Days per week: Not on file    Minutes per session: Not on file  . Stress: Not on file  Relationships  . Social connections:    Talks on phone: Not on file    Gets together: Not on file    Attends religious service: Not on file    Active member of club or organization: Not on file    Attends meetings of clubs or organizations: Not on file   Relationship status: Not on file  . Intimate partner violence:    Fear of current or ex partner: Not on file    Emotionally abused: Not on file    Physically abused: Not on file    Forced sexual activity: Not on file  Other Topics Concern  . Not on file  Social History Narrative   Household- pt, wife, 2 children still at home     Family History  Problem Relation Age of Onset  . Heart attack Father 19       MI ,died @ 89 (52rd MI)  . Arthritis Mother        ? type, ? not RA - Living  . Heart attack Paternal Uncle 73       MI  . Arthritis Paternal Grandmother        ??RA  . Heart attack Paternal Grandfather 72       MI  . Lung cancer Maternal Grandfather        smoker  . Heart attack Paternal Uncle   . Healthy Sister   . Asthma Son   . Allergies Son   . Healthy  Son        #2  . Diabetes Neg Hx   . Stroke Neg Hx   . Colon cancer Neg Hx   . Colon polyps Neg Hx   . Rectal cancer Neg Hx   . Stomach cancer Neg Hx   . Prostate cancer Neg Hx     Allergies as of 12/11/2017   No Known Allergies     Medication List        Accurate as of 12/11/17 11:59 PM. Always use your most recent med list.          acetaminophen 325 MG tablet Commonly known as:  TYLENOL Take 650 mg by mouth as needed. Reported on 11/29/2015   amLODipine 5 MG tablet Commonly known as:  NORVASC Take 1 tablet (5 mg total) by mouth daily.   aspirin EC 81 MG tablet Take 81 mg by mouth daily.   azelastine 0.1 % nasal spray Commonly known as:  ASTELIN Place 1 spray into both nostrils 2 (two) times daily. Use in each nostril as directed   fluticasone 50 MCG/ACT nasal spray Commonly known as:  FLONASE Place 2 sprays into both nostrils daily as needed for allergies or rhinitis.   folic acid 222 MCG tablet Commonly known as:  FOLVITE Take 400 mcg by mouth daily.   methotrexate 2.5 MG tablet Commonly known as:  RHEUMATREX Take 5 tablets by mouth weekly as directed.   multivitamin tablet Take 1  tablet by mouth daily.   omeprazole 20 MG capsule Commonly known as:  PRILOSEC Take 1 capsule (20 mg total) by mouth daily.   rosuvastatin 10 MG tablet Commonly known as:  CRESTOR Take 1 tablet (10 mg total) by mouth daily.          Objective:   Physical Exam BP 126/70 (BP Location: Left Arm, Patient Position: Sitting, Cuff Size: Normal)   Pulse (!) 55   Temp 97.6 F (36.4 C) (Oral)   Resp 14   Ht 6' (1.829 m)   Wt 232 lb 6 oz (105.4 kg)   SpO2 96%   BMI 31.52 kg/m  General:   Well developed, well nourished . NAD.  Neck: No  thyromegaly  HEENT:  Normocephalic . Face symmetric, atraumatic Lungs:  CTA B Normal respiratory effort, no intercostal retractions, no accessory muscle use. Heart: RRR,  no murmur.  No pretibial edema bilaterally  Abdomen:  Not distended, soft, non-tender. No rebound or rigidity.   Skin: Exposed areas without rash. Not pale. Not jaundice Neurologic:  alert & oriented X3.  Speech normal, gait appropriate for age and unassisted Strength symmetric and appropriate for age.  Psych: Cognition and judgment appear intact.  Cooperative with normal attention span and concentration.  Behavior appropriate. No anxious or depressed appearing.     Assessment & Plan:    Assessment (transfer to me 11-2016) Pre-diabetes: A1c 6.4 (03-2016) HTN Hyperlipidemia R.A. Dx ~ mid 62s + FH CAD  Plan: Labs:09-2016: CMP normal except for a CBG of 137.  Patient was fasting. Total cholesterol 118, LDL 50. CBC was okay Hyperglycemia: Doing well with exercise, needs to improve diet, that was discussed.  Check a A1c HTN: On amlodipine, BPs normal. Hyperlipidemia: On Crestor, last FLP satisfactory RTC 1 year

## 2017-12-11 NOTE — Progress Notes (Signed)
Pre visit review using our clinic review tool, if applicable. No additional management support is needed unless otherwise documented below in the visit note. 

## 2017-12-11 NOTE — Patient Instructions (Signed)
GO TO THE LAB : Get the blood work     GO TO THE FRONT DESK Schedule your next appointment for a  Physical exam in 1 year    DIABETES self learn tools: Get the book: "The [facility] Diabetes diet"   Get the book: "The Essential Diabetes Book" Online resources: The American diabetes Association     diabetes.[facility] website it is a Microbiologist  [place].org  The [place] web site has a diabetes section  [employer]

## 2017-12-12 NOTE — Assessment & Plan Note (Signed)
WOEH:09-1222: CMP normal except for a CBG of 137.  Patient was fasting. Total cholesterol 118, LDL 50. CBC was okay Hyperglycemia: Doing well with exercise, needs to improve diet, that was discussed.  Check a A1c HTN: On amlodipine, BPs normal. Hyperlipidemia: On Crestor, last FLP satisfactory RTC 1 year

## 2017-12-26 DIAGNOSIS — Z79899 Other long term (current) drug therapy: Secondary | ICD-10-CM | POA: Diagnosis not present

## 2017-12-26 DIAGNOSIS — M0579 Rheumatoid arthritis with rheumatoid factor of multiple sites without organ or systems involvement: Secondary | ICD-10-CM | POA: Diagnosis not present

## 2018-02-01 ENCOUNTER — Ambulatory Visit: Payer: BLUE CROSS/BLUE SHIELD | Admitting: Medical

## 2018-02-01 ENCOUNTER — Encounter: Payer: Self-pay | Admitting: Medical

## 2018-02-01 VITALS — BP 124/79 | HR 57 | Temp 97.7°F | Resp 16 | Ht 72.0 in | Wt 228.6 lb

## 2018-02-01 DIAGNOSIS — R0981 Nasal congestion: Secondary | ICD-10-CM

## 2018-02-01 DIAGNOSIS — J028 Acute pharyngitis due to other specified organisms: Secondary | ICD-10-CM | POA: Diagnosis not present

## 2018-02-01 DIAGNOSIS — J309 Allergic rhinitis, unspecified: Secondary | ICD-10-CM | POA: Diagnosis not present

## 2018-02-01 DIAGNOSIS — K219 Gastro-esophageal reflux disease without esophagitis: Secondary | ICD-10-CM

## 2018-02-01 MED ORDER — RANITIDINE HCL 150 MG PO CAPS: 150.0000 mg | ORAL_CAPSULE | Freq: Two times a day (BID) | ORAL | 0 refills | Status: DC

## 2018-02-01 MED ORDER — METHYLPREDNISOLONE ACETATE 40 MG/ML IJ SUSP
40.0000 mg | Freq: Once | INTRAMUSCULAR | Status: AC
  Administered 2018-02-01: 40 mg via INTRAMUSCULAR

## 2018-02-01 MED ORDER — AZITHROMYCIN 250 MG PO TABS: ORAL_TABLET | ORAL | 0 refills | Status: DC

## 2018-02-01 NOTE — Progress Notes (Signed)
Subjective:    Patient ID: Robert Velazquez, male    DOB: Sep 05, 1965, 52 y.o.   MRN: [id number]  HPI  Pt in with some recent nasal congestion and runny nose for about 2 days. Feel some pnd. Some st.(some pain in throat when he swallows) for about 5 days.  Pt denies any fevers, chills or sweats.  He is spitting up some some mucus in the morning.   Pt wonders if some of symptoms could be reflux related. Pt drinks pepsi and mountain. Most of time once a day. Pt states when first diagnosed with reflux his throat hurt.  Pt is on astelin and flonase. Also using otc vic product which may be equivalent to Afrin.     Review of Systems  Constitutional: Negative for chills, fatigue and fever.  HENT: Positive for congestion, postnasal drip, sinus pressure and sore throat. Negative for drooling and ear pain.   Respiratory: Negative for cough, chest tightness, shortness of breath and wheezing.   Cardiovascular: Negative for chest pain and palpitations.  Gastrointestinal: Negative for abdominal pain.       Hx of reflux.  Musculoskeletal: Negative for back pain.  Neurological: Negative for dizziness, numbness and headaches.  Hematological: Negative for adenopathy. Does not bruise/bleed easily.  Psychiatric/Behavioral: Negative for behavioral problems and confusion.    Past Medical History:  Diagnosis Date  . Arthritis    rheumatoid arthritis variant; Dr Jolee Ewing  . GERD (gastroesophageal reflux disease)   . History of chicken pox   . Hyperlipidemia    NMR LDL 140(1854/1324)TG 102,HDL 33  . Hypertension      Social History   Socioeconomic History  . Marital status: Married    Spouse name: Not on file  . Number of children: 2  . Years of education: Not on file  . Highest education level: Not on file  Occupational History  . Occupation: MARKETING AND Scientist, clinical (histocompatibility and immunogenetics): [employer]  Social Needs  . Financial resource strain: Not on file  . Food insecurity:    Worry: Not  on file    Inability: Not on file  . Transportation needs:    Medical: Not on file    Non-medical: Not on file  Tobacco Use  . Smoking status: Never Smoker  . Smokeless tobacco: Never Used  Substance and Sexual Activity  . Alcohol use: No  . Drug use: No  . Sexual activity: Not on file  Lifestyle  . Physical activity:    Days per week: Not on file    Minutes per session: Not on file  . Stress: Not on file  Relationships  . Social connections:    Talks on phone: Not on file    Gets together: Not on file    Attends religious service: Not on file    Active member of club or organization: Not on file    Attends meetings of clubs or organizations: Not on file    Relationship status: Not on file  . Intimate partner violence:    Fear of current or ex partner: Not on file    Emotionally abused: Not on file    Physically abused: Not on file    Forced sexual activity: Not on file  Other Topics Concern  . Not on file  Social History Narrative   Household- pt, wife, 2 children still at home    Past Surgical History:  Procedure Laterality Date  . EPIDURAL BLOCK INJECTION  2011-2014   ? 6-7  total DDD ;Dr.Dahldorf & Dr Jacelyn Grip  . KNEE SURGERY     arthoscopic x2  . MOUTH SURGERY     wisdom teeth & tooth removed from palate & re-implated  . TONSILLECTOMY      Family History  Problem Relation Age of Onset  . Heart attack Father 16       MI ,died @ 74 (25rd MI)  . Arthritis Mother        ? type, ? not RA - Living  . Heart attack Paternal Uncle 22       MI  . Arthritis Paternal Grandmother        ??RA  . Heart attack Paternal Grandfather 90       MI  . Lung cancer Maternal Grandfather        smoker  . Heart attack Paternal Uncle   . Healthy Sister   . Asthma Son   . Allergies Son   . Healthy Son        #2  . Diabetes Neg Hx   . Stroke Neg Hx   . Colon cancer Neg Hx   . Colon polyps Neg Hx   . Rectal cancer Neg Hx   . Stomach cancer Neg Hx   . Prostate cancer Neg Hx       No Known Allergies  Current Outpatient Medications on File Prior to Visit  Medication Sig Dispense Refill  . acetaminophen (TYLENOL) 325 MG tablet Take 650 mg by mouth as needed. Reported on 11/29/2015    . amLODipine (NORVASC) 5 MG tablet Take 1 tablet (5 mg total) by mouth daily. 90 tablet 1  . aspirin EC 81 MG tablet Take 81 mg by mouth daily.    Marland Kitchen azelastine (ASTELIN) 0.1 % nasal spray Place 1 spray into both nostrils 2 (two) times daily. Use in each nostril as directed 30 mL 12  . fluticasone (FLONASE) 50 MCG/ACT nasal spray Place 2 sprays into both nostrils daily as needed for allergies or rhinitis. 16 g 12  . folic acid (FOLVITE) 378 MCG tablet Take 400 mcg by mouth daily.    . methotrexate (RHEUMATREX) 2.5 MG tablet Take 5 tablets by mouth weekly as directed.    . Multiple Vitamin (MULTIVITAMIN) tablet Take 1 tablet by mouth daily.    Marland Kitchen omeprazole (PRILOSEC) 20 MG capsule Take 1 capsule (20 mg total) by mouth daily. 90 capsule 3  . rosuvastatin (CRESTOR) 10 MG tablet Take 1 tablet (10 mg total) by mouth daily. 90 tablet 1   No current facility-administered medications on file prior to visit.     BP 124/79   Pulse (!) 57   Temp 97.7 F (36.5 C) (Oral)   Resp 16   Ht 6' (1.829 m)   Wt 228 lb 9.6 oz (103.7 kg)   SpO2 97%   BMI 31.00 kg/m       Objective:   Physical Exam  General  Mental Status - Alert. General Appearance - Well groomed. Not in acute distress.  Skin Rashes- No Rashes.  HEENT Head- Normal. Ear Auditory Canal - Left- Normal. Right - Normal.Tympanic Membrane- Left- Normal. Right- Normal. Eye Sclera/Conjunctiva- Left- Normal. Right- Normal. Nose & Sinuses Nasal Mucosa- Left-  Boggy and Congested. Right-  Boggy and  Congested.Bilateral maxillary  But no frontal sinus pressure. Mouth & Throat Lips: Upper Lip- Normal: no dryness, cracking, pallor, cyanosis, or vesicular eruption. Lower Lip-Normal: no dryness, cracking, pallor, cyanosis or vesicular  eruption. Buccal Mucosa- Bilateral- No Aphthous  ulcers. Oropharynx- No Discharge or Erythema. Tonsils: Characteristics- Bilateral- No Erythema or Congestion. Size/Enlargement- Bilateral- No enlargement. Discharge- bilateral-None.  Neck Neck- Supple. No Masses.   Chest and Lung Exam Auscultation: Breath Sounds:-Clear even and unlabored.  Cardiovascular Auscultation:Rythm- Regular, rate and rhythm. Murmurs & Other Heart Sounds:Ausculatation of the heart reveal- No Murmurs.  Lymphatic Head & Neck General Head & Neck Lymphatics: Bilateral: Description- No Localized lymphadenopathy.       Assessment & Plan:  Presents moderate to severe nasal congestion may be allergic rhinitis symptoms with partial component of rebound nasal congestion from Afrin like nasal spray/product.  Would double check on the component in your Vicks nasal spray and stop that if it does have oxymetazoline.  Would advise continuing Flonase and Astelin.  Since I do think your nasal congestion is moderate to severe, we gave you Depo-Medrol 40 mg IM.  Concern for possible early sinus infection.  Prescribed a azithromycin antibiotic.  You do have some reflux history.  I do not think this is causing your recent symptoms.  Would advise continuing omeprazole.  Since you are traveling to [place] on vacation next week, I am giving you print prescription of ranitidine to use in the event her reflux flares while eating on vacation.  Strep test negative. I think st from pnd presently.  Follow-up in 7 to 10 days or as needed.  Mackie Pai, PA-C

## 2018-02-01 NOTE — Patient Instructions (Signed)
Presents moderate to severe nasal congestion may be allergic rhinitis symptoms with partial component of rebound nasal congestion from Afrin like nasal spray/product.  Would double check on the component in your Vicks nasal spray and stop that if it does have oxymetazoline.  Would advise continuing Flonase and Astelin.  Since I do think your nasal congestion is moderate to severe, we gave you Depo-Medrol 40 mg IM.  Concern for possible early sinus infection.  Prescribed a azithromycin antibiotic.  You do have some reflux history.  I do not think this is causing your recent symptoms.  Would advise continuing omeprazole.  Since you are traveling to [place] on vacation next week, I am giving you print prescription of ranitidine to use in the event her reflux flares while eating on vacation.  Follow-up in 7 to 10 days or as needed.

## 2018-03-08 ENCOUNTER — Other Ambulatory Visit: Payer: Self-pay | Admitting: Internal Medicine

## 2018-03-08 DIAGNOSIS — R198 Other specified symptoms and signs involving the digestive system and abdomen: Secondary | ICD-10-CM

## 2018-03-08 DIAGNOSIS — I10 Essential (primary) hypertension: Secondary | ICD-10-CM

## 2018-03-08 DIAGNOSIS — R0989 Other specified symptoms and signs involving the circulatory and respiratory systems: Secondary | ICD-10-CM

## 2018-03-08 DIAGNOSIS — E785 Hyperlipidemia, unspecified: Secondary | ICD-10-CM

## 2018-04-05 DIAGNOSIS — M0579 Rheumatoid arthritis with rheumatoid factor of multiple sites without organ or systems involvement: Secondary | ICD-10-CM | POA: Diagnosis not present

## 2018-04-05 DIAGNOSIS — Z79899 Other long term (current) drug therapy: Secondary | ICD-10-CM | POA: Diagnosis not present

## 2018-04-09 LAB — HEPATIC FUNCTION PANEL
ALK PHOS: 62 (ref 25–125)
ALT: 31 (ref 10–40)
AST: 21 (ref 14–40)
BILIRUBIN DIRECT: 0.34 (ref 0.01–0.4)
BILIRUBIN, TOTAL: 1.1

## 2018-04-09 LAB — BASIC METABOLIC PANEL
BUN: 11 (ref 4–21)
CREATININE: 1 (ref 0.6–1.3)
GLUCOSE: 114
POTASSIUM: 3.8 (ref 3.4–5.3)
Sodium: 144 (ref 137–147)

## 2018-04-09 LAB — CBC AND DIFFERENTIAL
HCT: 49 (ref 41–53)
Hemoglobin: 16.3 (ref 13.5–17.5)
Platelets: 198 (ref 150–399)
WBC: 7.4

## 2018-04-09 LAB — LIPID PANEL
Cholesterol: 100 (ref 0–200)
HDL: 34 — AB (ref 35–70)
LDL Cholesterol: 40
LDl/HDL Ratio: 2.9
Triglycerides: 132 (ref 40–160)

## 2018-04-09 LAB — HEMOGLOBIN A1C: Hgb A1c MFr Bld: 6.3 — AB (ref 4.0–6.0)

## 2018-04-09 LAB — IRON,TIBC AND FERRITIN PANEL: Iron: 100

## 2018-04-18 ENCOUNTER — Encounter: Payer: Self-pay | Admitting: Internal Medicine

## 2018-04-18 ENCOUNTER — Ambulatory Visit: Payer: BLUE CROSS/BLUE SHIELD | Admitting: Internal Medicine

## 2018-04-18 VITALS — BP 126/74 | HR 56 | Temp 97.9°F | Resp 16 | Ht 72.0 in | Wt 230.1 lb

## 2018-04-18 DIAGNOSIS — E785 Hyperlipidemia, unspecified: Secondary | ICD-10-CM | POA: Diagnosis not present

## 2018-04-18 DIAGNOSIS — I10 Essential (primary) hypertension: Secondary | ICD-10-CM | POA: Diagnosis not present

## 2018-04-18 DIAGNOSIS — R600 Localized edema: Secondary | ICD-10-CM

## 2018-04-18 NOTE — Progress Notes (Signed)
Pre visit review using our clinic review tool, if applicable. No additional management support is needed unless otherwise documented below in the visit note. 

## 2018-04-18 NOTE — Patient Instructions (Signed)
Stop amlodipine  Keep an eye on your blood pressure.   Check the  blood pressure   Week  Be sure your blood pressure is between 110/65 and  135/85. If it is consistently higher or lower, let me know  Keep your legs elevated  Stay active  Low-salt diet  Take half of   Crestor

## 2018-04-18 NOTE — Progress Notes (Signed)
Subjective:    Patient ID: Robert Velazquez, male    DOB: 04-15-1966, 52 y.o.   MRN: [id number]  DOS:  04/18/2018 Type of visit - description : Acute visit Interval history: For the last 3 months is having on and off left worse than right lower extremity swelling, from the calf down. Sometimes the calf feels tight. Symptoms are worse at the end of the day, better when he wakes up. Edema is also worse when he drives long distances however he did take an airplane trip a couple of months ago and he had no issues.   Review of Systems  Denies chest pain or difficulty breathing. Admits to a sedentary lifestyle.  Past Medical History:  Diagnosis Date  . Arthritis    rheumatoid arthritis variant; Dr Jolee Ewing  . GERD (gastroesophageal reflux disease)   . History of chicken pox   . Hyperlipidemia    NMR LDL 140(1854/1324)TG 102,HDL 33  . Hypertension     Past Surgical History:  Procedure Laterality Date  . EPIDURAL BLOCK INJECTION  2011-2014   ? 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 KNEE SURGERY     arthoscopic x2  . MOUTH SURGERY     wisdom teeth & tooth removed from palate & re-implated  . TONSILLECTOMY      Social History   Socioeconomic History  . Marital status: Married    Spouse name: Not on file  . Number of children: 2  . Years of education: Not on file  . Highest education level: Not on file  Occupational History  . Occupation: MARKETING AND Scientist, clinical (histocompatibility and immunogenetics): [employer]  Social Needs  . Financial resource strain: Not on file  . Food insecurity:    Worry: Not on file    Inability: Not on file  . Transportation needs:    Medical: Not on file    Non-medical: Not on file  Tobacco Use  . Smoking status: Never Smoker  . Smokeless tobacco: Never Used  Substance and Sexual Activity  . Alcohol use: No  . Drug use: No  . Sexual activity: Not on file  Lifestyle  . Physical activity:    Days per week: Not on file    Minutes per session: Not on file    . Stress: Not on file  Relationships  . Social connections:    Talks on phone: Not on file    Gets together: Not on file    Attends religious service: Not on file    Active member of club or organization: Not on file    Attends meetings of clubs or organizations: Not on file    Relationship status: Not on file  . Intimate partner violence:    Fear of current or ex partner: Not on file    Emotionally abused: Not on file    Physically abused: Not on file    Forced sexual activity: Not on file  Other Topics Concern  . Not on file  Social History Narrative   Household- pt, wife, 2 children still at home      Allergies as of 04/18/2018   No Known Allergies     Medication List        Accurate as of 04/18/18 11:59 PM. Always use your most recent med list.          acetaminophen 325 MG tablet Commonly known as:  TYLENOL Take 650 mg by mouth as needed. Reported on 11/29/2015  aspirin EC 81 MG tablet Take 81 mg by mouth daily.   azelastine 0.1 % nasal spray Commonly known as:  ASTELIN Place 1 spray into both nostrils 2 (two) times daily. Use in each nostril as directed   fluticasone 50 MCG/ACT nasal spray Commonly known as:  FLONASE Place 2 sprays into both nostrils daily as needed for allergies or rhinitis.   folic acid 774 MCG tablet Commonly known as:  FOLVITE Take 400 mcg by mouth daily.   methotrexate 2.5 MG tablet Commonly known as:  RHEUMATREX Take 5 tablets by mouth weekly as directed.   multivitamin tablet Take 1 tablet by mouth daily.   omeprazole 20 MG capsule Commonly known as:  PRILOSEC Take 1 capsule (20 mg total) by mouth daily.   ranitidine 150 MG capsule Commonly known as:  ZANTAC Take 1 capsule (150 mg total) by mouth 2 (two) times daily.   rosuvastatin 10 MG tablet Commonly known as:  CRESTOR Take 0.5 tablets (5 mg total) by mouth daily.          Objective:   Physical Exam BP 126/74 (BP Location: Left Arm, Patient Position: Sitting,  Cuff Size: Small)   Pulse (!) 56   Temp 97.9 F (36.6 C) (Oral)   Resp 16   Ht 6' (1.829 m)   Wt 230 lb 2 oz (104.4 kg)   SpO2 98%   BMI 31.21 kg/m  General:   Well developed, NAD, see BMI.  HEENT:  Normocephalic . Face symmetric, atraumatic Lungs:  CTA B Normal respiratory effort, no intercostal retractions, no accessory muscle use. Heart: RRR,  no murmur.  LE: No pretibial edema bilaterally Calves symmetric. Just above the ankles, the right side is  slightly larger, a chronic finding according to the patient since that time he had a Baker's cyst on the right knee.. Skin: Not pale. Not jaundice Neurologic:  alert & oriented X3.  Speech normal, gait appropriate for age and unassisted Psych--  Cognition and judgment appear intact.  Cooperative with normal attention span and concentration.  Behavior appropriate. No anxious or depressed appearing.      Assessment & Plan:   Assessment (transfer to me 11-2016) Pre-diabetes: A1c 6.4 (03-2016) HTN Hyperlipidemia R.A. Dx ~ mid 57s + FH CAD  Plan: Lower extremity edema: Bilaterally, dependant, on and off.  Suspect amlodipine is -at least in part-  the culprit.   Rec to  stop amlodipine,   leg elevation, stay active and low-salt diet.  If not better consider all other etiologies and treatment options. HTN: Well-controlled, will stop amlodipine, monitor BPs, consider add another medication if needed (losartan?). We also discussed his recent labs, phosphorus was so minimally low that we agreed not to recheck. A1c was 6.3, needs to watch his diet even more closely. R.A.: Seen by rheumatology 04/05/2018, was recommended to continue MTX, folic acid, labs done. High cholesterol: Last LDL was 40, decrease Crestor to 5 mg.,  Plans to have labs done at work in few months and will forward them to me

## 2018-04-19 NOTE — Assessment & Plan Note (Signed)
Lower extremity edema: Bilaterally, dependant, on and off.  Suspect amlodipine is -at least in part-  the culprit.   Rec to  stop amlodipine,   leg elevation, stay active and low-salt diet.  If not better consider all other etiologies and treatment options. HTN: Well-controlled, will stop amlodipine, monitor BPs, consider add another medication if needed (losartan?). We also discussed his recent labs, phosphorus was so minimally low that we agreed not to recheck. A1c was 6.3, needs to watch his diet even more closely. R.A.: Seen by rheumatology 04/05/2018, was recommended to continue MTX, folic acid, labs done. High cholesterol: Last LDL was 40, decrease Crestor to 5 mg.,  Plans to have labs done at work in few months and will forward them to me

## 2018-04-23 ENCOUNTER — Telehealth: Payer: Self-pay | Admitting: *Deleted

## 2018-04-23 NOTE — Telephone Encounter (Signed)
Received Lab Report results from [facility] via [facility]; forwarded to provider/SLS 09/10

## 2018-04-29 NOTE — Telephone Encounter (Signed)
Labs were from 04/09/2018 and they have already been abstracted.

## 2018-04-30 ENCOUNTER — Other Ambulatory Visit: Payer: Self-pay | Admitting: Internal Medicine

## 2018-04-30 MED ORDER — LOSARTAN POTASSIUM 100 MG PO TABS: 100.0000 mg | ORAL_TABLET | Freq: Every day | ORAL | 1 refills | Status: DC

## 2018-04-30 NOTE — Progress Notes (Signed)
Pre visit review using our clinic tool,if applicable. No additional management support is needed unless otherwise documented below Velazquez the visit note.   Patient "Robert Velazquez" today for BP check. States his B has been elevated during home BP checks. Stopped Amlodipine 2 weeks ago due to edema Velazquez calves and ankles. Patient complains of headaches. No chest pain. No SOB.  BP today = 152/103 P = 54 O2 = 95%  Per Dr. Larose Kells patient to start Losartan 100 mg daily. If he feels this is too strong and experiences weakness patient may cut back to 1/2 tablet (50 mg) daily. Record BP daily at home.  Advised patient if he develops SOB,Chest pains, go to ED asap. Patient agreed.  Return for office visit Velazquez 2 weeks with provider. Patient agreed appointment scheduled before he left office   [name], MD

## 2018-05-14 ENCOUNTER — Ambulatory Visit: Payer: BLUE CROSS/BLUE SHIELD | Admitting: Internal Medicine

## 2018-05-14 ENCOUNTER — Encounter: Payer: Self-pay | Admitting: Internal Medicine

## 2018-05-14 VITALS — BP 132/84 | HR 84 | Temp 98.0°F | Resp 16 | Ht 72.0 in | Wt 229.1 lb

## 2018-05-14 DIAGNOSIS — I10 Essential (primary) hypertension: Secondary | ICD-10-CM

## 2018-05-14 DIAGNOSIS — E785 Hyperlipidemia, unspecified: Secondary | ICD-10-CM | POA: Diagnosis not present

## 2018-05-14 LAB — BASIC METABOLIC PANEL
BUN: 12 mg/dL (ref 6–23)
CALCIUM: 9 mg/dL (ref 8.4–10.5)
CHLORIDE: 106 meq/L (ref 96–112)
CO2: 29 meq/L (ref 19–32)
Creatinine, Ser: 0.97 mg/dL (ref 0.40–1.50)
GFR: 85.72 mL/min (ref >60.00)
Glucose, Bld: 114 mg/dL — ABNORMAL HIGH (ref 70–99)
POTASSIUM: 3.6 meq/L (ref 3.5–5.1)
SODIUM: 143 meq/L (ref 135–145)

## 2018-05-14 MED ORDER — ROSUVASTATIN CALCIUM 5 MG PO TABS: 5.0000 mg | ORAL_TABLET | Freq: Every day | ORAL | 6 refills | Status: DC

## 2018-05-14 MED ORDER — LOSARTAN POTASSIUM 100 MG PO TABS: 100.0000 mg | ORAL_TABLET | Freq: Every day | ORAL | 6 refills | Status: DC

## 2018-05-14 NOTE — Progress Notes (Signed)
Pre visit review using our clinic review tool, if applicable. No additional management support is needed unless otherwise documented below in the visit note. 

## 2018-05-14 NOTE — Progress Notes (Signed)
Subjective:    Patient ID: Robert Velazquez, male    DOB: [birth year], 52 y.o.   MRN: [id number]  DOS:  05/14/2018 Type of visit - description : f/u  Interval history: HTN: Since we stop amlodipine, edema  resolved. BP increased to 150/100 he was a started on losartan. High cholesterol: Taking Crestor 10 mg half tablet daily, very hard to break the tablets.  Review of Systems Denies chest pain or difficulty breathing No cough No headache or dizziness  Past Medical History:  Diagnosis Date  . Arthritis    rheumatoid arthritis variant; Dr Jolee Ewing  . GERD (gastroesophageal reflux disease)   . History of chicken pox   . Hyperlipidemia    NMR LDL 140(1854/1324)TG 102,HDL 33  . Hypertension     Past Surgical History:  Procedure Laterality Date  . EPIDURAL BLOCK INJECTION  2011-2014   ? 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 KNEE SURGERY     arthoscopic x2  . MOUTH SURGERY     wisdom teeth & tooth removed from palate & re-implated  . TONSILLECTOMY      Social History   Socioeconomic History  . Marital status: Married    Spouse name: Not on file  . Number of children: 2  . Years of education: Not on file  . Highest education level: Not on file  Occupational History  . Occupation: MARKETING AND Scientist, clinical (histocompatibility and immunogenetics): [employer]  Social Needs  . Financial resource strain: Not on file  . Food insecurity:    Worry: Not on file    Inability: Not on file  . Transportation needs:    Medical: Not on file    Non-medical: Not on file  Tobacco Use  . Smoking status: Never Smoker  . Smokeless tobacco: Never Used  Substance and Sexual Activity  . Alcohol use: No  . Drug use: No  . Sexual activity: Not on file  Lifestyle  . Physical activity:    Days per week: Not on file    Minutes per session: Not on file  . Stress: Not on file  Relationships  . Social connections:    Talks on phone: Not on file    Gets together: Not on file    Attends religious service: Not  on file    Active member of club or organization: Not on file    Attends meetings of clubs or organizations: Not on file    Relationship status: Not on file  . Intimate partner violence:    Fear of current or ex partner: Not on file    Emotionally abused: Not on file    Physically abused: Not on file    Forced sexual activity: Not on file  Other Topics Concern  . Not on file  Social History Narrative   Household- pt, wife, 2 children still at home      Allergies as of 05/14/2018   No Known Allergies     Medication List        Accurate as of 05/14/18 11:59 PM. Always use your most recent med list.          acetaminophen 325 MG tablet Commonly known as:  TYLENOL Take 650 mg by mouth as needed. Reported on 11/29/2015   aspirin EC 81 MG tablet Take 81 mg by mouth daily.   azelastine 0.1 % nasal spray Commonly known as:  ASTELIN Place 1 spray into both nostrils 2 (two) times daily. Use in  each nostril as directed   fluticasone 50 MCG/ACT nasal spray Commonly known as:  FLONASE Place 2 sprays into both nostrils daily as needed for allergies or rhinitis.   folic acid 932 MCG tablet Commonly known as:  FOLVITE Take 400 mcg by mouth daily.   losartan 100 MG tablet Commonly known as:  COZAAR Take 1 tablet (100 mg total) by mouth daily.   methotrexate 2.5 MG tablet Commonly known as:  RHEUMATREX Take 5 tablets by mouth weekly as directed.   multivitamin tablet Take 1 tablet by mouth daily.   omeprazole 20 MG capsule Commonly known as:  PRILOSEC Take 1 capsule (20 mg total) by mouth daily.   ranitidine 150 MG capsule Commonly known as:  ZANTAC Take 1 capsule (150 mg total) by mouth 2 (two) times daily.   rosuvastatin 5 MG tablet Commonly known as:  CRESTOR Take 1 tablet (5 mg total) by mouth daily.          Objective:   Physical Exam BP 132/84 (BP Location: Left Arm, Patient Position: Sitting, Cuff Size: Small)   Pulse 84   Temp 98 F (36.7 C) (Oral)    Resp 16   Ht 6' (1.829 m)   Wt 229 lb 2 oz (103.9 kg)   SpO2 98%   BMI 31.07 kg/m  General:   Well developed, NAD, see BMI.  HEENT:  Normocephalic . Face symmetric, atraumatic Lungs:  CTA B Normal respiratory effort, no intercostal retractions, no accessory muscle use. Heart: RRR,  no murmur.  No pretibial edema bilaterally  Skin: Not pale. Not jaundice Neurologic:  alert & oriented X3.  Speech normal, gait appropriate for age and unassisted Psych--  Cognition and judgment appear intact.  Cooperative with normal attention span and concentration.  Behavior appropriate. No anxious or depressed appearing.      Assessment & Plan:    Assessment (transfer to me 11-2016) Pre-diabetes: A1c 6.4 (03-2016) HTN Hyperlipidemia R.A. Dx ~ mid 62s + FH CAD  Plan: HTN: See last visit, amlodipine stopped due to leg swelling, swelling resolved.  BP increased, was started on losartan, currently taking 100 mg daily.  BP today is very good, recommend ambulatory BPs, check BMP today. High cholesterol: Taking Crestor 10 mg, half tablet daily, change to Crestor 5 mg tablet, take 1 daily. Plans to have labs at work in few months, will forward results. Otherwise RTC CPX 11-2018.

## 2018-05-14 NOTE — Patient Instructions (Addendum)
GO TO THE LAB : Get the blood work     GO TO THE FRONT DESK Schedule your next appointment for a  Physical exam by 11/2018   Check the  blood pressure 2 or 3 times a week  Be sure your blood pressure is between 110/65 and  135/85. If it is consistently higher or lower, let me know

## 2018-05-15 NOTE — Assessment & Plan Note (Signed)
HTN: See last visit, amlodipine stopped due to leg swelling, swelling resolved.  BP increased, was started on losartan, currently taking 100 mg daily.  BP today is very good, recommend ambulatory BPs, check BMP today. High cholesterol: Taking Crestor 10 mg, half tablet daily, change to Crestor 5 mg tablet, take 1 daily. Plans to have labs at work in few months, will forward results. Otherwise RTC CPX 11-2018.

## 2018-05-20 ENCOUNTER — Encounter: Payer: Self-pay | Admitting: Internal Medicine

## 2018-05-27 ENCOUNTER — Encounter: Payer: Self-pay | Admitting: Internal Medicine

## 2018-05-28 ENCOUNTER — Other Ambulatory Visit: Payer: Self-pay | Admitting: Internal Medicine

## 2018-05-28 MED ORDER — HYDROCHLOROTHIAZIDE 25 MG PO TABS: 25.0000 mg | ORAL_TABLET | Freq: Every day | ORAL | 0 refills | Status: DC

## 2018-06-11 ENCOUNTER — Ambulatory Visit: Payer: BLUE CROSS/BLUE SHIELD | Admitting: Internal Medicine

## 2018-06-11 ENCOUNTER — Encounter: Payer: Self-pay | Admitting: Internal Medicine

## 2018-06-11 VITALS — BP 138/100 | HR 51 | Temp 97.6°F | Resp 16 | Ht 72.0 in | Wt 229.4 lb

## 2018-06-11 DIAGNOSIS — I10 Essential (primary) hypertension: Secondary | ICD-10-CM

## 2018-06-11 LAB — BASIC METABOLIC PANEL
BUN: 16 mg/dL (ref 6–23)
CALCIUM: 8.9 mg/dL (ref 8.4–10.5)
CO2: 29 meq/L (ref 19–32)
Chloride: 104 mEq/L (ref 96–112)
Creatinine, Ser: 0.94 mg/dL (ref 0.40–1.50)
GFR: 88.86 mL/min (ref >60.00)
Glucose, Bld: 141 mg/dL — ABNORMAL HIGH (ref 70–99)
Potassium: 3.7 mEq/L (ref 3.5–5.1)
SODIUM: 140 meq/L (ref 135–145)

## 2018-06-11 NOTE — Progress Notes (Signed)
Subjective:    Patient ID: Robert Velazquez, male    DOB: 1966-01-14, 52 y.o.   MRN: [id number]  DOS:  06/11/2018 Type of visit - description : f/u Interval history: Here for BP management   Review of Systems The patient reported feeling lethargic after he started losartan, but has gradually gotten better. Occasionally take ibuprofen. Denies chest pain, difficulty breathing no lower extremity edema  Past Medical History:  Diagnosis Date  . Arthritis    rheumatoid arthritis variant; Dr Jolee Ewing  . GERD (gastroesophageal reflux disease)   . History of chicken pox   . Hyperlipidemia    NMR LDL 140(1854/1324)TG 102,HDL 33  . Hypertension     Past Surgical History:  Procedure Laterality Date  . EPIDURAL BLOCK INJECTION  2011-2014   ? 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 KNEE SURGERY     arthoscopic x2  . MOUTH SURGERY     wisdom teeth & tooth removed from palate & re-implated  . TONSILLECTOMY      Social History   Socioeconomic History  . Marital status: Married    Spouse name: Not on file  . Number of children: 2  . Years of education: Not on file  . Highest education level: Not on file  Occupational History  . Occupation: MARKETING AND Scientist, clinical (histocompatibility and immunogenetics): [employer]  Social Needs  . Financial resource strain: Not on file  . Food insecurity:    Worry: Not on file    Inability: Not on file  . Transportation needs:    Medical: Not on file    Non-medical: Not on file  Tobacco Use  . Smoking status: Never Smoker  . Smokeless tobacco: Never Used  Substance and Sexual Activity  . Alcohol use: No  . Drug use: No  . Sexual activity: Not on file  Lifestyle  . Physical activity:    Days per week: Not on file    Minutes per session: Not on file  . Stress: Not on file  Relationships  . Social connections:    Talks on phone: Not on file    Gets together: Not on file    Attends religious service: Not on file    Active member of club or organization:  Not on file    Attends meetings of clubs or organizations: Not on file    Relationship status: Not on file  . Intimate partner violence:    Fear of current or ex partner: Not on file    Emotionally abused: Not on file    Physically abused: Not on file    Forced sexual activity: Not on file  Other Topics Concern  . Not on file  Social History Narrative   Household- pt, wife, 2 children still at home      Allergies as of 06/11/2018   No Known Allergies     Medication List        Accurate as of 06/11/18  3:49 PM. Always use your most recent med list.          acetaminophen 325 MG tablet Commonly known as:  TYLENOL Take 650 mg by mouth as needed. Reported on 11/29/2015   aspirin EC 81 MG tablet Take 81 mg by mouth daily.   azelastine 0.1 % nasal spray Commonly known as:  ASTELIN Place 1 spray into both nostrils 2 (two) times daily. Use in each nostril as directed   fluticasone 50 MCG/ACT nasal spray Commonly known as:  FLONASE Place 2 sprays into both nostrils daily as needed for allergies or rhinitis.   folic acid 295 MCG tablet Commonly known as:  FOLVITE Take 400 mcg by mouth daily.   hydrochlorothiazide 25 MG tablet Commonly known as:  HYDRODIURIL Take 1 tablet (25 mg total) by mouth daily.   losartan 100 MG tablet Commonly known as:  COZAAR Take 1 tablet (100 mg total) by mouth daily.   methotrexate 2.5 MG tablet Commonly known as:  RHEUMATREX Take 5 tablets by mouth weekly as directed.   multivitamin tablet Take 1 tablet by mouth daily.   omeprazole 20 MG capsule Commonly known as:  PRILOSEC Take 1 capsule (20 mg total) by mouth daily.   ranitidine 150 MG capsule Commonly known as:  ZANTAC Take 1 capsule (150 mg total) by mouth 2 (two) times daily.   rosuvastatin 5 MG tablet Commonly known as:  CRESTOR Take 1 tablet (5 mg total) by mouth daily.          Objective:   Physical Exam BP (!) 138/100 (BP Location: Left Arm, Patient Position:  Sitting, Cuff Size: Normal)   Pulse (!) 51   Temp 97.6 F (36.4 C) (Oral)   Resp 16   Ht 6' (1.829 m)   Wt 229 lb 6 oz (104 kg)   SpO2 97%   BMI 31.11 kg/m   General:   Well developed, NAD, see BMI.  HEENT:  Normocephalic . Face symmetric, atraumatic Lungs:  CTA B Normal respiratory effort, no intercostal retractions, no accessory muscle use. Heart: RRR,  no murmur.  No pretibial edema bilaterally  Skin: Not pale. Not jaundice Neurologic:  alert & oriented X3.  Speech normal, gait appropriate for age and unassisted Psych--  Cognition and judgment appear intact.  Cooperative with normal attention span and concentration.  Behavior appropriate. No anxious or depressed appearing.      Assessment & Plan:    Assessment (transfer to me 11-2016) Pre-diabetes: A1c 6.4 (03-2016) HTN (amlodipine= edema noted 2019) Hyperlipidemia R.A. Dx ~ mid 43s + FH CAD  Plan: HTN:  Last month, amlodipine was discontinued due to edema, losartan started. Shortly after, he felt lethargic,  BP started to increase so HCTZ was added. At this point  he is not feeling lethargic anymore, BPswas  elevated up to 170/ 100 but in the last week, the numbers are coming down to 120s, 130s, 150s. With his home cuff BP today was: 151/110. With our cuff: 138/100 (thus likely getting slt higher /innacurate readings at home) Plan: BMP, continue checking ambulatory BPs with a new manometer, watch salt intake; if not better, willadd another agent noting his heart rate is in the low side   To get a  nurse visit for BP check if he doesn't trust his readings  RTC 4 months

## 2018-06-11 NOTE — Assessment & Plan Note (Signed)
HTN:  Last month, amlodipine was discontinued due to edema, losartan started. Shortly after, he felt lethargic,  BP started to increase so HCTZ was added. At this point  he is not feeling lethargic anymore, BPswas  elevated up to 170/ 100 but in the last week, the numbers are coming down to 120s, 130s, 150s. With his home cuff BP today was: 151/110. With our cuff: 138/100 (thus likely getting slt higher /innacurate readings at home) Plan: BMP, continue checking ambulatory BPs with a new manometer, watch salt intake; if not better, willadd another agent noting his heart rate is in the low side   To get a  nurse visit for BP check if he doesn't trust his readings  RTC 4 months

## 2018-06-11 NOTE — Patient Instructions (Signed)
GO TO THE LAB : Get the blood work     GO TO THE FRONT DESK Schedule your next appointment for a  Check up in 4 months    Check the  blood pressure daily, after resting for 10 or 15 minutes Be sure your blood pressure is between 110/65 and  135/85. If it is consistently higher or lower, let me know

## 2018-06-11 NOTE — Progress Notes (Signed)
Pre visit review using our clinic review tool, if applicable. No additional management support is needed unless otherwise documented below in the visit note. 

## 2018-06-28 ENCOUNTER — Other Ambulatory Visit: Payer: Self-pay | Admitting: Internal Medicine

## 2018-07-05 DIAGNOSIS — Z79899 Other long term (current) drug therapy: Secondary | ICD-10-CM | POA: Diagnosis not present

## 2018-07-05 DIAGNOSIS — M0579 Rheumatoid arthritis with rheumatoid factor of multiple sites without organ or systems involvement: Secondary | ICD-10-CM | POA: Diagnosis not present

## 2018-09-30 ENCOUNTER — Other Ambulatory Visit: Payer: Self-pay | Admitting: Internal Medicine

## 2018-10-15 DIAGNOSIS — M0579 Rheumatoid arthritis with rheumatoid factor of multiple sites without organ or systems involvement: Secondary | ICD-10-CM | POA: Diagnosis not present

## 2018-10-15 DIAGNOSIS — Z79899 Other long term (current) drug therapy: Secondary | ICD-10-CM | POA: Diagnosis not present

## 2018-10-17 ENCOUNTER — Encounter: Payer: Self-pay | Admitting: Internal Medicine

## 2018-10-17 ENCOUNTER — Ambulatory Visit (INDEPENDENT_AMBULATORY_CARE_PROVIDER_SITE_OTHER): Payer: BLUE CROSS/BLUE SHIELD | Admitting: Internal Medicine

## 2018-10-17 VITALS — BP 126/76 | HR 58 | Temp 97.8°F | Resp 16 | Ht 72.0 in | Wt 233.4 lb

## 2018-10-17 DIAGNOSIS — R739 Hyperglycemia, unspecified: Secondary | ICD-10-CM | POA: Diagnosis not present

## 2018-10-17 DIAGNOSIS — I10 Essential (primary) hypertension: Secondary | ICD-10-CM

## 2018-10-17 LAB — HEMOGLOBIN A1C: Hgb A1c MFr Bld: 6 % (ref 4.6–6.5)

## 2018-10-17 NOTE — Progress Notes (Signed)
Subjective:    Patient ID: Robert Velazquez, male    DOB: 10-Mar-1966, 53 y.o.   MRN: [id number]  DOS:  10/17/2018 Type of visit - description: rov HTN: Good blood pressure readings since the last visit except 1 time  w/ systolic BP in the 314 Developed a URI 4 days ago, postnasal dripping, some mucus pooling in the throat.  Taking OTCs.  Review of Systems Denies fever chills No nausea, vomiting, diarrhea.  No unusual aches and pains. Mild cough.  Past Medical History:  Diagnosis Date  . Arthritis    rheumatoid arthritis variant; Dr Jolee Ewing  . GERD (gastroesophageal reflux disease)   . History of chicken pox   . Hyperlipidemia    NMR LDL 140(1854/1324)TG 102,HDL 33  . Hypertension     Past Surgical History:  Procedure Laterality Date  . EPIDURAL BLOCK INJECTION  2011-2014   ? 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 KNEE SURGERY     arthoscopic x2  . MOUTH SURGERY     wisdom teeth & tooth removed from palate & re-implated  . TONSILLECTOMY      Social History   Socioeconomic History  . Marital status: Married    Spouse name: Not on file  . Number of children: 2  . Years of education: Not on file  . Highest education level: Not on file  Occupational History  . Occupation: MARKETING AND Scientist, clinical (histocompatibility and immunogenetics): [employer]  Social Needs  . Financial resource strain: Not on file  . Food insecurity:    Worry: Not on file    Inability: Not on file  . Transportation needs:    Medical: Not on file    Non-medical: Not on file  Tobacco Use  . Smoking status: Never Smoker  . Smokeless tobacco: Never Used  Substance and Sexual Activity  . Alcohol use: No  . Drug use: No  . Sexual activity: Not on file  Lifestyle  . Physical activity:    Days per week: Not on file    Minutes per session: Not on file  . Stress: Not on file  Relationships  . Social connections:    Talks on phone: Not on file    Gets together: Not on file    Attends religious service: Not on file     Active member of club or organization: Not on file    Attends meetings of clubs or organizations: Not on file    Relationship status: Not on file  . Intimate partner violence:    Fear of current or ex partner: Not on file    Emotionally abused: Not on file    Physically abused: Not on file    Forced sexual activity: Not on file  Other Topics Concern  . Not on file  Social History Narrative   Household- pt, wife, 2 children still at home      Allergies as of 10/17/2018   No Known Allergies     Medication List       Accurate as of October 17, 2018  8:45 AM. Always use your most recent med list.        acetaminophen 325 MG tablet Commonly known as:  TYLENOL Take 650 mg by mouth as needed. Reported on 11/29/2015   aspirin EC 81 MG tablet Take 81 mg by mouth daily.   azelastine 0.1 % nasal spray Commonly known as:  ASTELIN Place 1 spray into both nostrils 2 (two) times daily.  Use in each nostril as directed   fluticasone 50 MCG/ACT nasal spray Commonly known as:  FLONASE Place 2 sprays into both nostrils daily as needed for allergies or rhinitis.   folic acid 270 MCG tablet Commonly known as:  FOLVITE Take 400 mcg by mouth daily.   hydrochlorothiazide 25 MG tablet Commonly known as:  HYDRODIURIL Take 1 tablet (25 mg total) by mouth daily.   losartan 100 MG tablet Commonly known as:  COZAAR Take 1 tablet (100 mg total) by mouth daily.   methotrexate 2.5 MG tablet Commonly known as:  RHEUMATREX Take 5 tablets by mouth weekly as directed.   multivitamin tablet Take 1 tablet by mouth daily.   omeprazole 20 MG capsule Commonly known as:  PRILOSEC Take 1 capsule (20 mg total) by mouth daily.   ranitidine 150 MG capsule Commonly known as:  ZANTAC Take 1 capsule (150 mg total) by mouth 2 (two) times daily.   rosuvastatin 5 MG tablet Commonly known as:  CRESTOR Take 1 tablet (5 mg total) by mouth daily.           Objective:   Physical Exam BP 126/76 (BP  Location: Left Arm, Patient Position: Sitting, Cuff Size: Normal)   Pulse (!) 58   Temp 97.8 F (36.6 C) (Oral)   Resp 16   Ht 6' (1.829 m)   Wt 233 lb 6 oz (105.9 kg)   SpO2 96%   BMI 31.65 kg/m  General:   Well developed, NAD, BMI noted. HEENT:  Normocephalic . Face symmetric, atraumatic.  TMs normal, throat symmetric and not red. Lungs:  CTA B Normal respiratory effort, no intercostal retractions, no accessory muscle use. Heart: RRR,  no murmur.  No pretibial edema bilaterally  Skin: Not pale. Not jaundice Neurologic:  alert & oriented X3.  Speech normal, gait appropriate for age and unassisted Psych--  Cognition and judgment appear intact.  Cooperative with normal attention span and concentration.  Behavior appropriate. No anxious or depressed appearing.      Assessment      Assessment (transfer to me 11-2016) Pre-diabetes: A1c 6.4 (03-2016) HTN (amlodipine= edema noted 2019) Hyperlipidemia GERD R.A. Dx ~ mid 35s + FH CAD  Plan: HTN: Controlled on HCTZ, losartan.  BMP and CBC done in rheumatology few days ago within normal except for high sugar Diabetes: Check A1c. GERD: Controlled with Protonix URI: Exam normal, no fever chills, recommend OTCs.  See AVS, call if not better RTC already scheduled April 2020 CPX

## 2018-10-17 NOTE — Assessment & Plan Note (Signed)
HTN: Controlled on HCTZ, losartan.  BMP and CBC done in rheumatology few days ago within normal except for high sugar Diabetes: Check A1c. GERD: Controlled with Protonix URI: Exam normal, no fever chills, recommend OTCs.  See AVS, call if not better RTC already scheduled April 2020 CPX

## 2018-10-17 NOTE — Patient Instructions (Addendum)
GO TO THE LAB : Get the blood work    See you in May  For cough:  Take Mucinex DM twice a day as needed until better  For nasal congestion: Use OTC   Flonase : 2 nasal sprays on each side of the nose in the morning until you feel better   Avoid decongestants such as  Pseudoephedrine or phenylephrine   Call if not gradually better   ====  Check the  blood pressure 2 or 3 times a month week monthly weekly daily Be sure your blood pressure is between 110/65 and  135/85. If it is consistently higher or lower, let me know   HOW TO TAKE YOUR BLOOD PRESSURE:   Rest 5 minutes before taking your blood pressure.   Don't smoke or drink caffeinated beverages for at least 30 minutes before.   Take your blood pressure before (not after) you eat.   Sit comfortably with your back supported and both feet on the floor (don't cross your legs).   Elevate your arm to heart level on a table or a desk.   Use the proper sized cuff. It should fit smoothly and snugly around your bare upper arm. There should be enough room to slip a fingertip under the cuff. The bottom edge of the cuff should be 1 inch above the crease of the elbow.   Ideally, take 3 measurements at one sitting and record the average.

## 2018-10-17 NOTE — Progress Notes (Signed)
Pre visit review using our clinic review tool, if applicable. No additional management support is needed unless otherwise documented below in the visit note. 

## 2018-10-21 ENCOUNTER — Other Ambulatory Visit: Payer: Self-pay | Admitting: Internal Medicine

## 2018-10-21 ENCOUNTER — Encounter: Payer: Self-pay | Admitting: Internal Medicine

## 2018-10-21 MED ORDER — BENZONATATE 200 MG PO CAPS: 200.0000 mg | ORAL_CAPSULE | Freq: Three times a day (TID) | ORAL | 0 refills | Status: DC | PRN

## 2018-10-21 MED ORDER — AZITHROMYCIN 250 MG PO TABS: ORAL_TABLET | ORAL | 0 refills | Status: DC

## 2018-10-21 NOTE — Addendum Note (Signed)
Addended by: [name] E on: 10/21/2018 04:39 PM   Modules accepted: Orders

## 2018-11-14 ENCOUNTER — Other Ambulatory Visit: Payer: Self-pay

## 2018-11-14 ENCOUNTER — Encounter (HOSPITAL_BASED_OUTPATIENT_CLINIC_OR_DEPARTMENT_OTHER): Payer: Self-pay | Admitting: *Deleted

## 2018-11-14 ENCOUNTER — Emergency Department (HOSPITAL_BASED_OUTPATIENT_CLINIC_OR_DEPARTMENT_OTHER): Payer: BLUE CROSS/BLUE SHIELD

## 2018-11-14 ENCOUNTER — Emergency Department (HOSPITAL_BASED_OUTPATIENT_CLINIC_OR_DEPARTMENT_OTHER)
Admission: EM | Admit: 2018-11-14 | Discharge: 2018-11-14 | Disposition: A | Discharge status: Home / Self Care | Payer: BLUE CROSS/BLUE SHIELD | Attending: Emergency Medicine | Admitting: Emergency Medicine

## 2018-11-14 DIAGNOSIS — I10 Essential (primary) hypertension: Secondary | ICD-10-CM | POA: Insufficient documentation

## 2018-11-14 DIAGNOSIS — Z79899 Other long term (current) drug therapy: Secondary | ICD-10-CM | POA: Diagnosis not present

## 2018-11-14 DIAGNOSIS — R109 Unspecified abdominal pain: Secondary | ICD-10-CM

## 2018-11-14 DIAGNOSIS — R1031 Right lower quadrant pain: Secondary | ICD-10-CM | POA: Diagnosis not present

## 2018-11-14 DIAGNOSIS — R1011 Right upper quadrant pain: Secondary | ICD-10-CM | POA: Diagnosis not present

## 2018-11-14 DIAGNOSIS — N200 Calculus of kidney: Secondary | ICD-10-CM | POA: Diagnosis not present

## 2018-11-14 DIAGNOSIS — Z7982 Long term (current) use of aspirin: Secondary | ICD-10-CM | POA: Diagnosis not present

## 2018-11-14 DIAGNOSIS — N132 Hydronephrosis with renal and ureteral calculous obstruction: Secondary | ICD-10-CM | POA: Diagnosis not present

## 2018-11-14 LAB — BASIC METABOLIC PANEL
Anion gap: 9 (ref 5–15)
BUN: 17 mg/dL (ref 6–20)
CO2: 28 mmol/L (ref 22–32)
Calcium: 8.7 mg/dL — ABNORMAL LOW (ref 8.9–10.3)
Chloride: 100 mmol/L (ref 98–111)
Creatinine, Ser: 1.11 mg/dL (ref 0.61–1.24)
GFR calc Af Amer: >60 mL/min (ref >60)
GFR calc non Af Amer: >60 mL/min (ref >60)
Glucose, Bld: 132 mg/dL — ABNORMAL HIGH (ref 70–99)
Potassium: 3.6 mmol/L (ref 3.5–5.1)
Sodium: 137 mmol/L (ref 135–145)

## 2018-11-14 LAB — CBC WITH DIFFERENTIAL/PLATELET
Abs Immature Granulocytes: 0.03 10*3/uL (ref 0.00–0.07)
Basophils Absolute: 0.1 10*3/uL (ref 0.0–0.1)
Basophils Relative: 1 %
Eosinophils Absolute: 0.2 10*3/uL (ref 0.0–0.5)
Eosinophils Relative: 3 %
HCT: 49.4 % (ref 39.0–52.0)
Hemoglobin: 16.3 g/dL (ref 13.0–17.0)
Immature Granulocytes: 0 %
Lymphocytes Relative: 33 %
Lymphs Abs: 2.4 10*3/uL (ref 0.7–4.0)
MCH: 31.3 pg (ref 26.0–34.0)
MCHC: 33 g/dL (ref 30.0–36.0)
MCV: 95 fL (ref 80.0–100.0)
Monocytes Absolute: 0.6 10*3/uL (ref 0.1–1.0)
Monocytes Relative: 8 %
Neutro Abs: 4 10*3/uL (ref 1.7–7.7)
Neutrophils Relative %: 55 %
Platelets: 176 10*3/uL (ref 150–400)
RBC: 5.2 MIL/uL (ref 4.22–5.81)
RDW: 13.4 % (ref 11.5–15.5)
WBC: 7.2 10*3/uL (ref 4.0–10.5)
nRBC: 0 % (ref 0.0–0.2)

## 2018-11-14 LAB — URINALYSIS, MICROSCOPIC (REFLEX): RBC / HPF: >50 RBC/hpf (ref 0–5)

## 2018-11-14 LAB — URINALYSIS, ROUTINE W REFLEX MICROSCOPIC
Bilirubin Urine: NEGATIVE
Glucose, UA: NEGATIVE mg/dL
Ketones, ur: NEGATIVE mg/dL
Nitrite: NEGATIVE
Protein, ur: NEGATIVE mg/dL
Specific Gravity, Urine: 1.01 (ref 1.005–1.030)
pH: 7 (ref 5.0–8.0)

## 2018-11-14 MED ORDER — KETOROLAC TROMETHAMINE 15 MG/ML IJ SOLN
15.0000 mg | Freq: Once | INTRAMUSCULAR | Status: AC
  Administered 2018-11-14: 15 mg via INTRAVENOUS
  Filled 2018-11-14: qty 1

## 2018-11-14 MED ORDER — HYDROCODONE-ACETAMINOPHEN 5-325 MG PO TABS: 1.0000 | ORAL_TABLET | Freq: Four times a day (QID) | ORAL | 0 refills | Status: DC | PRN

## 2018-11-14 NOTE — Discharge Instructions (Signed)
Follow-up with urology. Take ibuprofen every 6 hours as needed. Take Tylenol every 4 hours as needed for pain. For severe pain take norco or vicodin however realize they have the potential for addiction and it can make you sleepy and has tylenol in it.  No operating machinery while taking.

## 2018-11-14 NOTE — ED Provider Notes (Signed)
[facility] Provider Note   CSN: [id number] Arrival date & time: 11/14/18  2004    History   Chief Complaint Chief Complaint  Patient presents with  . Abdominal Pain    HPI Robert Velazquez is a 53 y.o. male.     Patient with history of rheumatoid arthritis, on low-dose methotrexate, reflux presents with right lower flank pain more anterior intermittent for the past 3 weeks.  Worsening recently.  No fevers chills or vomiting.  No respiratory symptoms.  Patient still has his appendix.  Pain currently mild.  Mild radiation to the testicle without testicular swelling.     Past Medical History:  Diagnosis Date  . Arthritis    rheumatoid arthritis variant; Dr Jolee Ewing  . GERD (gastroesophageal reflux disease)   . History of chicken pox   . Hyperlipidemia    NMR LDL 140(1854/1324)TG 102,HDL 33  . Hypertension     Patient Active Problem List   Diagnosis Date Noted  . PCP NOTES >>>>>>>>>>>>> 12/05/2016  . Globus sensation 12/22/2015  . Chronic nasal congestion 09/24/2015  . Annual physical exam 11/20/2014  . Family history of early CAD 11/20/2014  . Essential hypertension 08/18/2010  . Rheumatoid arthritis ([place]) 02/09/2010  . 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 Hyperlipidemia 10/04/2007    Past Surgical History:  Procedure Laterality Date  . EPIDURAL BLOCK INJECTION  2011-2014   ? 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 KNEE SURGERY     arthoscopic x2  . MOUTH SURGERY     wisdom teeth & tooth removed from palate & re-implated  . TONSILLECTOMY          Home Medications    Prior to Admission medications   Medication Sig Start Date End Date Taking? Authorizing Provider  acetaminophen (TYLENOL) 325 MG tablet Take 650 mg by mouth as needed. Reported on 11/29/2015    [provider]  aspirin EC 81 MG tablet Take 81 mg by mouth daily.    [provider]  azelastine (ASTELIN) 0.1 % nasal spray Place 1 spray into both  nostrils 2 (two) times daily. Use in each nostril as directed 06/07/17   Colon Branch, MD  azithromycin (ZITHROMAX Z-PAK) 250 MG tablet 2 tabs a day the first day, then 1 tab a day x 4 days 10/21/18   Colon Branch, MD  benzonatate (TESSALON) 200 MG capsule Take 1 capsule (200 mg total) by mouth 3 (three) times daily as needed for cough. 10/21/18   Colon Branch, MD  fluticasone [facility]) 50 MCG/ACT nasal spray Place 2 sprays into both nostrils daily as needed for allergies or rhinitis. 06/07/17   Colon Branch, MD  folic acid (FOLVITE) 811 MCG tablet Take 400 mcg by mouth daily.    [provider]  hydrochlorothiazide (HYDRODIURIL) 25 MG tablet Take 1 tablet (25 mg total) by mouth daily. 06/28/18   Colon Branch, MD  HYDROcodone-acetaminophen (NORCO/VICODIN) 5-325 MG tablet Take 1-2 tablets by mouth every 6 (six) hours as needed. 11/14/18   Elnora Morrison, MD  losartan (COZAAR) 100 MG tablet Take 1 tablet (100 mg total) by mouth daily. 05/14/18   Colon Branch, MD  methotrexate (RHEUMATREX) 2.5 MG tablet Take 5 tablets by mouth weekly as directed. 12/11/17   Colon Branch, MD  Multiple Vitamin (MULTIVITAMIN) tablet Take 1 tablet by mouth daily.    [provider]  omeprazole (PRILOSEC) 20 MG capsule Take 1 capsule (20 mg total) by  mouth daily. 03/11/18   Colon Branch, MD  rosuvastatin (CRESTOR) 5 MG tablet Take 1 tablet (5 mg total) by mouth daily. 09/30/18   Colon Branch, MD    Family History Family History  Problem Relation Age of Onset  . Heart attack Father 14       MI ,died @ 1 (39rd MI)  . Arthritis Mother        ? type, ? not RA - Living  . Heart attack Paternal Uncle 33       MI  . Arthritis Paternal Grandmother        ??RA  . Heart attack Paternal Grandfather 20       MI  . Lung cancer Maternal Grandfather        smoker  . Heart attack Paternal Uncle   . Healthy Sister   . Asthma Son   . Allergies Son   . Healthy Son        #2  . Diabetes Neg Hx   . Stroke Neg Hx   . Colon  cancer Neg Hx   . Colon polyps Neg Hx   . Rectal cancer Neg Hx   . Stomach cancer Neg Hx   . Prostate cancer Neg Hx     Social History Social History   Tobacco Use  . Smoking status: Never Smoker  . Smokeless tobacco: Never Used  Substance Use Topics  . Alcohol use: No  . Drug use: No     Allergies   Patient has no known allergies.   Review of Systems Review of Systems  Constitutional: Negative for chills and fever.  HENT: Negative for congestion.   Eyes: Negative for visual disturbance.  Respiratory: Negative for shortness of breath.   Cardiovascular: Negative for chest pain.  Gastrointestinal: Positive for abdominal pain. Negative for vomiting.  Genitourinary: Positive for flank pain. Negative for dysuria.  Musculoskeletal: Negative for back pain, neck pain and neck stiffness.  Skin: Negative for rash.  Neurological: Negative for light-headedness and headaches.     Physical Exam Updated Vital Signs BP (!) 156/96 (BP Location: Left Arm)   Pulse 82   Temp 97.7 F (36.5 C) (Oral)   Resp 18   Ht 5\' 10"  (1.778 m)   Wt 102.1 kg   SpO2 100%   BMI 32.28 kg/m   Physical Exam Vitals signs and nursing note reviewed.  Constitutional:      Appearance: He is well-developed.  HENT:     Head: Normocephalic and atraumatic.  Eyes:     General:        Right eye: No discharge.        Left eye: No discharge.     Conjunctiva/sclera: Conjunctivae normal.  Neck:     Musculoskeletal: Normal range of motion and neck supple.     Trachea: No tracheal deviation.  Cardiovascular:     Rate and Rhythm: Normal rate and regular rhythm.  Pulmonary:     Effort: Pulmonary effort is normal.     Breath sounds: Normal breath sounds.  Abdominal:     General: There is no distension.     Palpations: Abdomen is soft.     Tenderness: There is abdominal tenderness (location right lower flank, non tender on exam). There is no guarding.  Genitourinary:    Penis: Normal.       Scrotum/Testes: Normal.  Skin:    General: Skin is warm.     Findings: No rash.  Neurological:  Mental Status: He is alert and oriented to person, place, and time.      ED Treatments / Results  Labs (all labs ordered are listed, but only abnormal results are displayed) Labs Reviewed  URINALYSIS, ROUTINE W REFLEX MICROSCOPIC - Abnormal; Notable for the following components:      Result Value   APPearance HAZY (*)    Hgb urine dipstick LARGE (*)    Leukocytes,Ua TRACE (*)    All other components within normal limits  BASIC METABOLIC PANEL - Abnormal; Notable for the following components:   Glucose, Bld 132 (*)    Calcium 8.7 (*)    All other components within normal limits  URINALYSIS, MICROSCOPIC (REFLEX) - Abnormal; Notable for the following components:   Bacteria, UA FEW (*)    All other components within normal limits  URINE CULTURE  CBC WITH DIFFERENTIAL/PLATELET    EKG None  Radiology Ct Renal Stone Study  Result Date: 11/14/2018 CLINICAL DATA:  Acute right lower quadrant abdominal pain. EXAM: CT ABDOMEN AND PELVIS WITHOUT CONTRAST TECHNIQUE: Multidetector CT imaging of the abdomen and pelvis was performed following the standard protocol without IV contrast. COMPARISON:  None. FINDINGS: Lower chest: No acute abnormality. Hepatobiliary: Small gallstone is noted. No gallbladder wall thickening is noted. No biliary dilatation is noted. Probable right hepatic cyst is noted. Pancreas: Unremarkable. No pancreatic ductal dilatation or surrounding inflammatory changes. Spleen: Normal in size without focal abnormality. Adrenals/Urinary Tract: Adrenal glands appear normal. Nonobstructive left nephrolithiasis is noted. Mild right hydroureteronephrosis is noted secondary to 4 mm calculus at right ureterovesical junction. Urinary bladder is otherwise unremarkable. Stomach/Bowel: Stomach is within normal limits. Appendix appears normal. No evidence of bowel wall thickening, distention,  or inflammatory changes. Vascular/Lymphatic: Aortic atherosclerosis. No enlarged abdominal or pelvic lymph nodes. Reproductive: Mild prostatic calcifications are noted. Mild prostatic enlargement is noted. Other: No abdominal wall hernia or abnormality. No abdominopelvic ascites. Musculoskeletal: No acute or significant osseous findings. IMPRESSION: Mild right hydroureteronephrosis is noted secondary to 4 mm calculus at right ureterovesical junction. Nonobstructive left nephrolithiasis. Minimal cholelithiasis. Aortic Atherosclerosis (ICD10-I70.0). Electronically Signed   By: Marijo Conception, M.D.   On: 11/14/2018 21:47    Procedures Procedures (including critical care time)  Medications Ordered in ED Medications  ketorolac (TORADOL) 15 MG/ML injection 15 mg (15 mg Intravenous Given 11/14/18 2116)     Initial Impression / Assessment and Plan / ED Course  I have reviewed the triage vital signs and the nursing notes.  Pertinent labs & imaging results that were available during my care of the patient were reviewed by me and considered in my medical decision making (see chart for details).       Patient presents with recurrent right lower flank pain clinically concerning for kidney stone.  With length of time almost 3 weeks plan for CT scan to confirm or look for other pathology.  Blood work pending to check kidney function.  Urinalysis pending.  Pain medicine Toradol ordered.  CT scan results reviewed showing 4 mm kidney stone UVJ on the right.  Patient's pain is controlled no vomiting.  Patient stable for outpatient follow-up with urology.  No sign of infection in urine.  Patient's kidney function is normal.  Urinalysis revealed hematuria as expected. Pt will need bp rechecked with pcp.  Results and differential diagnosis were discussed with the patient/parent/guardian. Xrays were independently reviewed by myself.  Close follow up outpatient was discussed, comfortable with the plan.    Medications  ketorolac (TORADOL) 15  MG/ML injection 15 mg (15 mg Intravenous Given 11/14/18 2116)    Vitals:   11/14/18 2010 11/14/18 2011 11/14/18 2120  BP: (!) 178/117  (!) 156/96  Pulse: 75  82  Resp: 18  18  Temp: 97.7 F (36.5 C)    TempSrc: Oral    SpO2: 100%  100%  Weight:  102.1 kg   Height:  5\' 10"  (1.778 m)     Final diagnoses:  Acute right flank pain  Kidney stone on right side     Final Clinical Impressions(s) / ED Diagnoses   Final diagnoses:  Acute right flank pain  Kidney stone on right side    ED Discharge Orders         Ordered    HYDROcodone-acetaminophen (NORCO/VICODIN) 5-325 MG tablet  Every 6 hours PRN     11/14/18 2209           Elnora Morrison, MD 11/14/18 2215

## 2018-11-14 NOTE — ED Triage Notes (Signed)
Abdominal pain for 30 minutes. States it has been coming and going for 3 weeks. Pain goes into his right groin.

## 2018-11-16 LAB — URINE CULTURE: Culture: NO GROWTH

## 2018-11-20 ENCOUNTER — Telehealth: Payer: Self-pay | Admitting: Internal Medicine

## 2018-11-20 NOTE — Telephone Encounter (Signed)
Can you reach out to Pt for ED f/u (virtual)?

## 2018-11-20 NOTE — Telephone Encounter (Signed)
Please call patient,   went to the ER.  Needs a follow-up with urology or me

## 2018-11-20 NOTE — Telephone Encounter (Signed)
Okay, thank you!

## 2018-11-20 NOTE — Telephone Encounter (Signed)
Patient states he is following up with Dr. Nevada Crane?Urology on Monday.

## 2018-11-22 ENCOUNTER — Encounter: Payer: Self-pay | Admitting: Emergency Medicine

## 2018-11-22 ENCOUNTER — Other Ambulatory Visit: Payer: Self-pay

## 2018-11-22 ENCOUNTER — Emergency Department
Admission: EM | Admit: 2018-11-22 | Discharge: 2018-11-22 | Disposition: A | Discharge status: Home / Self Care | Payer: BLUE CROSS/BLUE SHIELD | Source: Home / Self Care | Attending: Family Medicine | Admitting: Family Medicine

## 2018-11-22 DIAGNOSIS — Z23 Encounter for immunization: Secondary | ICD-10-CM | POA: Diagnosis not present

## 2018-11-22 DIAGNOSIS — S61011A Laceration without foreign body of right thumb without damage to nail, initial encounter: Secondary | ICD-10-CM | POA: Diagnosis not present

## 2018-11-22 MED ORDER — TETANUS-DIPHTH-ACELL PERTUSSIS 5-2.5-18.5 LF-MCG/0.5 IM SUSP
0.5000 mL | Freq: Once | INTRAMUSCULAR | Status: AC
  Administered 2018-11-22: 0.5 mL via INTRAMUSCULAR

## 2018-11-22 NOTE — ED Provider Notes (Signed)
Robert Velazquez C[facility]    CSN: [id number] Arrival date & time: 11/22/18  1758     History   Chief Complaint Chief Complaint  Patient presents with  . Laceration    HPI Robert Velazquez is a 53 y.o. male.   Patient lacerated his right hand at base of thumb on a newly sharpened lawn mower blade today.  His last Tdap was 02/03/13/  The history is provided by the patient.  Laceration  Location:  Hand Hand laceration location:  Dorsum of L hand Length:  3cm Depth:  Through dermis Quality comment:  Flap laceration Bleeding: controlled   Time since incident:  45 minutes Laceration mechanism:  Metal edge Pain details:    Quality:  Aching   Severity:  Mild   Timing:  Constant   Progression:  Unchanged Foreign body present:  No foreign bodies Relieved by:  Nothing Worsened by:  Movement Ineffective treatments:  None tried Tetanus status:  Out of date Associated symptoms: no numbness and no swelling     Past Medical History:  Diagnosis Date  . Arthritis    rheumatoid arthritis variant; Dr Jolee Ewing  . GERD (gastroesophageal reflux disease)   . History of chicken pox   . Hyperlipidemia    NMR LDL 140(1854/1324)TG 102,HDL 33  . Hypertension     Patient Active Problem List   Diagnosis Date Noted  . PCP NOTES >>>>>>>>>>>>> 12/05/2016  . Globus sensation 12/22/2015  . Chronic nasal congestion 09/24/2015  . Annual physical exam 11/20/2014  . Family history of early CAD 11/20/2014  . Essential hypertension 08/18/2010  . Rheumatoid arthritis ([place]) 02/09/2010  . 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 Hyperlipidemia 10/04/2007    Past Surgical History:  Procedure Laterality Date  . EPIDURAL BLOCK INJECTION  2011-2014   ? 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 KNEE SURGERY     arthoscopic x2  . MOUTH SURGERY     wisdom teeth & tooth removed from palate & re-implated  . TONSILLECTOMY         Home Medications    Prior to Admission medications   Medication Sig Start  Date End Date Taking? Authorizing Provider  acetaminophen (TYLENOL) 325 MG tablet Take 650 mg by mouth as needed. Reported on 11/29/2015    [provider]  aspirin EC 81 MG tablet Take 81 mg by mouth daily.    [provider]  azelastine (ASTELIN) 0.1 % nasal spray Place 1 spray into both nostrils 2 (two) times daily. Use in each nostril as directed 06/07/17   Colon Branch, MD  azithromycin (ZITHROMAX Z-PAK) 250 MG tablet 2 tabs a day the first day, then 1 tab a day x 4 days 10/21/18   Colon Branch, MD  benzonatate (TESSALON) 200 MG capsule Take 1 capsule (200 mg total) by mouth 3 (three) times daily as needed for cough. 10/21/18   Colon Branch, MD  fluticasone [facility]) 50 MCG/ACT nasal spray Place 2 sprays into both nostrils daily as needed for allergies or rhinitis. 06/07/17   Colon Branch, MD  folic acid (FOLVITE) 811 MCG tablet Take 400 mcg by mouth daily.    [provider]  hydrochlorothiazide (HYDRODIURIL) 25 MG tablet Take 1 tablet (25 mg total) by mouth daily. 06/28/18   Colon Branch, MD  HYDROcodone-acetaminophen (NORCO/VICODIN) 5-325 MG tablet Take 1-2 tablets by mouth every 6 (six) hours as needed. 11/14/18   Elnora Morrison, MD  losartan (COZAAR) 100 MG tablet Take  1 tablet (100 mg total) by mouth daily. 05/14/18   Colon Branch, MD  methotrexate (RHEUMATREX) 2.5 MG tablet Take 5 tablets by mouth weekly as directed. 12/11/17   Colon Branch, MD  Multiple Vitamin (MULTIVITAMIN) tablet Take 1 tablet by mouth daily.    [provider]  omeprazole (PRILOSEC) 20 MG capsule Take 1 capsule (20 mg total) by mouth daily. 03/11/18   Colon Branch, MD  rosuvastatin (CRESTOR) 5 MG tablet Take 1 tablet (5 mg total) by mouth daily. 09/30/18   Colon Branch, MD    Family History Family History  Problem Relation Age of Onset  . Heart attack Father 85       MI ,died @ 27 (22rd MI)  . Arthritis Mother        ? type, ? not RA - Living  . Heart attack Paternal Uncle 70       MI  .  Arthritis Paternal Grandmother        ??RA  . Heart attack Paternal Grandfather 68       MI  . Lung cancer Maternal Grandfather        smoker  . Heart attack Paternal Uncle   . Healthy Sister   . Asthma Son   . Allergies Son   . Healthy Son        #2  . Diabetes Neg Hx   . Stroke Neg Hx   . Colon cancer Neg Hx   . Colon polyps Neg Hx   . Rectal cancer Neg Hx   . Stomach cancer Neg Hx   . Prostate cancer Neg Hx     Social History Social History   Tobacco Use  . Smoking status: Never Smoker  . Smokeless tobacco: Never Used  Substance Use Topics  . Alcohol use: No  . Drug use: No     Allergies   Patient has no known allergies.   Review of Systems Review of Systems  All other systems reviewed and are negative.    Physical Exam Triage Vital Signs ED Triage Vitals  Enc Vitals Group     BP 11/22/18 1819 (!) 146/92     Pulse Rate 11/22/18 1819 86     Resp --      Temp 11/22/18 1819 98.9 F (37.2 C)     Temp Source 11/22/18 1819 Oral     SpO2 11/22/18 1819 99 %     Weight 11/22/18 1821 223 lb (101.2 kg)     Height 11/22/18 1821 5\' 10"  (1.778 m)     Head Circumference --      Peak Flow --      Pain Score 11/22/18 1820 5     Pain Loc --      Pain Edu? --      Excl. in [place]? --    No data found.  Updated Vital Signs BP (!) 146/92 (BP Location: Left Arm)   Pulse 86   Temp 98.9 F (37.2 C) (Oral)   Ht 5\' 10"  (1.778 m)   Wt 101.2 kg   SpO2 99%   BMI 32.00 kg/m   Visual Acuity Right Eye Distance:   Left Eye Distance:   Bilateral Distance:    Right Eye Near:   Left Eye Near:    Bilateral Near:     Physical Exam Vitals signs and nursing note reviewed.  Constitutional:      General: He is not in acute distress. HENT:  Head: Atraumatic.     Nose: Nose normal.  Eyes:     Pupils: Pupils are equal, round, and reactive to light.  Cardiovascular:     Rate and Rhythm: Normal rate.  Pulmonary:     Effort: Pulmonary effort is normal.   Musculoskeletal:     Right hand: He exhibits laceration. He exhibits no swelling. Normal sensation noted. Normal strength noted.       Hands:     Comments: Right hand has a 3cm long flap laceration over the first MCP joint.  Right thumb has full range of motion with flexion and extension intact.  Distal neurovascular function is intact.   Skin:    General: Skin is warm and dry.  Neurological:     Mental Status: He is alert.      UC Treatments / Results  Labs (all labs ordered are listed, but only abnormal results are displayed) Labs Reviewed - No data to display  EKG None  Radiology No results found.  Procedures Procedures  Laceration Repair Discussed benefits and risks of procedure and verbal consent obtained. Using sterile technique and local anesthesia with 1% lidocaine without epinephrine, cleansed wound with Betadine followed by copious lavage with normal saline.  Wound carefully inspected for debris and foreign bodies; none found.  Wound closed with #9, 4-0 interrupted nylon sutures.  Bacitracin and non-stick sterile dressing applied, followed by light compression dressing.  Wound precautions explained to patient.  Return for suture removal in 10 days.   Medications Ordered in UC Medications  Tdap (BOOSTRIX) injection 0.5 mL (has no administration in time range)    Initial Impression / Assessment and Plan / UC Course  I have reviewed the triage vital signs and the nursing notes.  Pertinent labs & imaging results that were available during my care of the patient were reviewed by me and considered in my medical decision making (see chart for details).    Administered Tdap     Final Clinical Impressions(s) / UC Diagnoses   Final diagnoses:  Laceration of right thumb without foreign body without damage to nail, initial encounter     Discharge Instructions     Change dressing daily and apply Bacitracin ointment to wound.  Keep wound clean and dry.  Return for  any signs of infection (or follow-up with family doctor):  Increasing redness, swelling, pain, heat, drainage, etc. Return in 10 days for suture removal.   May take Tylenol as needed for pain.    ED Prescriptions    None         Kandra Nicolas, MD 11/28/18 [phone]

## 2018-11-22 NOTE — Discharge Instructions (Addendum)
Change dressing daily and apply Bacitracin ointment to wound.  Keep wound clean and dry.  Return for any signs of infection (or follow-up with family doctor):  Increasing redness, swelling, pain, heat, drainage, etc. Return in 10 days for suture removal.   May take Tylenol as needed for pain.

## 2018-11-22 NOTE — ED Triage Notes (Signed)
RT thumb laceration, cut on a sharpened lawn mower blade

## 2018-12-02 ENCOUNTER — Encounter: Payer: Self-pay | Admitting: *Deleted

## 2018-12-02 ENCOUNTER — Emergency Department (INDEPENDENT_AMBULATORY_CARE_PROVIDER_SITE_OTHER)
Admission: EM | Admit: 2018-12-02 | Discharge: 2018-12-02 | Disposition: A | Discharge status: Home / Self Care | Payer: BLUE CROSS/BLUE SHIELD | Source: Home / Self Care | Attending: Family Medicine | Admitting: Family Medicine

## 2018-12-02 ENCOUNTER — Other Ambulatory Visit: Payer: Self-pay

## 2018-12-02 DIAGNOSIS — Z4802 Encounter for removal of sutures: Secondary | ICD-10-CM

## 2018-12-02 MED ORDER — DOXYCYCLINE HYCLATE 100 MG PO CAPS: 100.0000 mg | ORAL_CAPSULE | Freq: Two times a day (BID) | ORAL | 0 refills | Status: DC

## 2018-12-02 NOTE — ED Triage Notes (Signed)
Pt is here today for suture removal RT thumb place 10 days ago.

## 2018-12-02 NOTE — Discharge Instructions (Addendum)
Keep wound clean and dry.  Return for any signs of infection:  Increasing redness, swelling, pain, heat, drainage, etc. Follow instructions on Dermabond information sheet.

## 2018-12-02 NOTE — ED Provider Notes (Signed)
Robert Velaz[facility] CARE    CSN: [id number] Arrival date & time: 12/02/18  0827     History   Chief Complaint Chief Complaint  Patient presents with  . Suture / Staple Removal    HPI Robert Velazquez is a 53 y.o. male.   Patient returns for suture removal from laceration over MCP joint of right thumb.  The wound was repaired 10 days ago and patient has no complaints.  He denies pain or drainage from the wound.  The history is provided by the patient.    Past Medical History:  Diagnosis Date  . Arthritis    rheumatoid arthritis variant; Dr Jolee Ewing  . GERD (gastroesophageal reflux disease)   . History of chicken pox   . Hyperlipidemia    NMR LDL 140(1854/1324)TG 102,HDL 33  . Hypertension     Patient Active Problem List   Diagnosis Date Noted  . PCP NOTES >>>>>>>>>>>>> 12/05/2016  . Globus sensation 12/22/2015  . Chronic nasal congestion 09/24/2015  . Annual physical exam 11/20/2014  . Family history of early CAD 11/20/2014  . Essential hypertension 08/18/2010  . Rheumatoid arthritis ([place]) 02/09/2010  . 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 Hyperlipidemia 10/04/2007    Past Surgical History:  Procedure Laterality Date  . EPIDURAL BLOCK INJECTION  2011-2014   ? 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 KNEE SURGERY     arthoscopic x2  . MOUTH SURGERY     wisdom teeth & tooth removed from palate & re-implated  . TONSILLECTOMY         Home Medications    Prior to Admission medications   Medication Sig Start Date End Date Taking? Authorizing Provider  acetaminophen (TYLENOL) 325 MG tablet Take 650 mg by mouth as needed. Reported on 11/29/2015    [provider]  aspirin EC 81 MG tablet Take 81 mg by mouth daily.    [provider]  azelastine (ASTELIN) 0.1 % nasal spray Place 1 spray into both nostrils 2 (two) times daily. Use in each nostril as directed 06/07/17   Colon Branch, MD  doxycycline (VIBRAMYCIN) 100 MG capsule Take 1 capsule (100 mg  total) by mouth 2 (two) times daily. Take with food. 12/02/18   Kandra Nicolas, MD  fluticasone (FLONASE) 50 MCG/ACT nasal spray Place 2 sprays into both nostrils daily as needed for allergies or rhinitis. 06/07/17   Colon Branch, MD  folic acid (FOLVITE) 517 MCG tablet Take 400 mcg by mouth daily.    [provider]  hydrochlorothiazide (HYDRODIURIL) 25 MG tablet Take 1 tablet (25 mg total) by mouth daily. 06/28/18   Colon Branch, MD  HYDROcodone-acetaminophen (NORCO/VICODIN) 5-325 MG tablet Take 1-2 tablets by mouth every 6 (six) hours as needed. 11/14/18   Elnora Morrison, MD  losartan (COZAAR) 100 MG tablet Take 1 tablet (100 mg total) by mouth daily. 05/14/18   Colon Branch, MD  methotrexate (RHEUMATREX) 2.5 MG tablet Take 5 tablets by mouth weekly as directed. 12/11/17   Colon Branch, MD  Multiple Vitamin (MULTIVITAMIN) tablet Take 1 tablet by mouth daily.    [provider]  omeprazole (PRILOSEC) 20 MG capsule Take 1 capsule (20 mg total) by mouth daily. 03/11/18   Colon Branch, MD  rosuvastatin (CRESTOR) 5 MG tablet Take 1 tablet (5 mg total) by mouth daily. 09/30/18   Colon Branch, MD    Family History Family History  Problem Relation Age of Onset  . Heart  attack Father 78       MI ,died @ 81 (67rd MI)  . Arthritis Mother        ? type, ? not RA - Living  . Heart attack Paternal Uncle 65       MI  . Arthritis Paternal Grandmother        ??RA  . Heart attack Paternal Grandfather 42       MI  . Lung cancer Maternal Grandfather        smoker  . Heart attack Paternal Uncle   . Healthy Sister   . Asthma Son   . Allergies Son   . Healthy Son        #2  . Diabetes Neg Hx   . Stroke Neg Hx   . Colon cancer Neg Hx   . Colon polyps Neg Hx   . Rectal cancer Neg Hx   . Stomach cancer Neg Hx   . Prostate cancer Neg Hx     Social History Social History   Tobacco Use  . Smoking status: Never Smoker  . Smokeless tobacco: Never Used  Substance Use Topics  . Alcohol use:  No  . Drug use: No     Allergies   Patient has no known allergies.   Review of Systems Review of Systems  Constitutional: Negative for chills, diaphoresis and fever.  Skin: Negative for color change and rash.  All other systems reviewed and are negative.    Physical Exam Triage Vital Signs ED Triage Vitals [12/02/18 0837]  Enc Vitals Group     BP      Pulse      Resp      Temp 97.8 F (36.6 C)     Temp Source Oral     SpO2      Weight      Height      Head Circumference      Peak Flow      Pain Score      Pain Loc      Pain Edu?      Excl. in [place]?    No data found.  Updated Vital Signs Temp 97.8 F (36.6 C) (Oral)   Visual Acuity Right Eye Distance:   Left Eye Distance:   Bilateral Distance:    Right Eye Near:   Left Eye Near:    Bilateral Near:     Physical Exam Vitals signs and nursing note reviewed.  Constitutional:      General: He is not in acute distress. Musculoskeletal:     Comments: Sutured laceration over MCP joint of right thumb has no drainage, swelling, tenderness, or warmth.  There is a halo of erythema at edges of wound repair.  Wound edges appear friable but no dehiscence.  Neurological:     Mental Status: He is alert.      UC Treatments / Results  Labs (all labs ordered are listed, but only abnormal results are displayed) Labs Reviewed - No data to display  EKG None  Radiology No results found.  Procedures Procedures  Partial suture removal.  Cleaned wound with saline and surrounding area with alcohol.  Removed all sutures except for two.  Reinforced wound with narrow Steri-Strips, then coated wound and Steri-Strips with Dermabond.  Medications Ordered in UC Medications - No data to display  Initial Impression / Assessment and Plan / UC Course  I have reviewed the triage vital signs and the nursing notes.  Pertinent labs & imaging  results that were available during my care of the patient were reviewed by me and  considered in my medical decision making (see chart for details).    Concern for early cellulitis.  Begin doxycycline 100mg  BID for 6 days.  Return in 5 days for suture removal.   Final Clinical Impressions(s) / UC Diagnoses   Final diagnoses:  Visit for suture removal     Discharge Instructions     Keep wound clean and dry.  Return for any signs of infection:  Increasing redness, swelling, pain, heat, drainage, etc. Follow instructions on Dermabond information sheet.     ED Prescriptions    Medication Sig Dispense Auth. Provider   doxycycline (VIBRAMYCIN) 100 MG capsule Take 1 capsule (100 mg total) by mouth 2 (two) times daily. Take with food. 12 capsule Kandra Nicolas, MD         Kandra Nicolas, MD 12/02/18 [phone]

## 2018-12-05 ENCOUNTER — Other Ambulatory Visit: Payer: Self-pay | Admitting: Internal Medicine

## 2018-12-05 DIAGNOSIS — R198 Other specified symptoms and signs involving the digestive system and abdomen: Secondary | ICD-10-CM

## 2018-12-05 DIAGNOSIS — R0989 Other specified symptoms and signs involving the circulatory and respiratory systems: Secondary | ICD-10-CM

## 2018-12-06 ENCOUNTER — Emergency Department (INDEPENDENT_AMBULATORY_CARE_PROVIDER_SITE_OTHER)
Admission: EM | Admit: 2018-12-06 | Discharge: 2018-12-06 | Disposition: A | Discharge status: Home / Self Care | Payer: BLUE CROSS/BLUE SHIELD | Source: Home / Self Care | Attending: Emergency Medicine | Admitting: Emergency Medicine

## 2018-12-06 ENCOUNTER — Other Ambulatory Visit: Payer: Self-pay

## 2018-12-06 DIAGNOSIS — Z4802 Encounter for removal of sutures: Secondary | ICD-10-CM

## 2018-12-06 NOTE — Discharge Instructions (Signed)
Keep area clean with soap and water. Return to clinic 2 weeks if wound does not heal. Return to clinic sooner if any signs of infection.

## 2018-12-06 NOTE — ED Triage Notes (Signed)
Stitches placed good Friday, had all but ? 3 removed Monday, steri strips and dura bond  Placed then.

## 2018-12-06 NOTE — ED Provider Notes (Signed)
Robert Velazquez [facility]ARE    CSN: [id number] Arrival date & time: 12/06/18  0836     History   Chief Complaint Chief Complaint  Patient presents with  . Suture / Staple Removal    HPI Robert Velazquez is a 53 y.o. male.  Patient here for follow-up of laceration to his thumb 2 weeks ago.  He was seen on Monday 10 days post laceration and the wound margins were not approximated.  Most of the sutures were removed and 3 were left and the wound was Steri-Stripped.  He enters today for suture removal. HPI  Past Medical History:  Diagnosis Date  . Arthritis    rheumatoid arthritis variant; Dr Jolee Ewing  . GERD (gastroesophageal reflux disease)   . History of chicken pox   . Hyperlipidemia    NMR LDL 140(1854/1324)TG 102,HDL 33  . Hypertension     Patient Active Problem List   Diagnosis Date Noted  . PCP NOTES >>>>>>>>>>>>> 12/05/2016  . Globus sensation 12/22/2015  . Chronic nasal congestion 09/24/2015  . Annual physical exam 11/20/2014  . Family history of early CAD 11/20/2014  . Essential hypertension 08/18/2010  . Rheumatoid arthritis ([place]) 02/09/2010  . 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 Hyperlipidemia 10/04/2007    Past Surgical History:  Procedure Laterality Date  . EPIDURAL BLOCK INJECTION  2011-2014   ? 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 KNEE SURGERY     arthoscopic x2  . MOUTH SURGERY     wisdom teeth & tooth removed from palate & re-implated  . TONSILLECTOMY         Home Medications    Prior to Admission medications   Medication Sig Start Date End Date Taking? Authorizing Provider  acetaminophen (TYLENOL) 325 MG tablet Take 650 mg by mouth as needed. Reported on 11/29/2015    [provider]  aspirin EC 81 MG tablet Take 81 mg by mouth daily.    [provider]  azelastine (ASTELIN) 0.1 % nasal spray Place 1 spray into both nostrils 2 (two) times daily. Use in each nostril as directed 06/07/17   Colon Branch, MD  doxycycline  (VIBRAMYCIN) 100 MG capsule Take 1 capsule (100 mg total) by mouth 2 (two) times daily. Take with food. 12/02/18   Kandra Nicolas, MD  fluticasone (FLONASE) 50 MCG/ACT nasal spray Place 2 sprays into both nostrils daily as needed for allergies or rhinitis. 06/07/17   Colon Branch, MD  folic acid (FOLVITE) 779 MCG tablet Take 400 mcg by mouth daily.    [provider]  hydrochlorothiazide (HYDRODIURIL) 25 MG tablet Take 1 tablet (25 mg total) by mouth daily. 06/28/18   Colon Branch, MD  HYDROcodone-acetaminophen (NORCO/VICODIN) 5-325 MG tablet Take 1-2 tablets by mouth every 6 (six) hours as needed. 11/14/18   Elnora Morrison, MD  losartan (COZAAR) 100 MG tablet Take 1 tablet (100 mg total) by mouth daily. 05/14/18   Colon Branch, MD  methotrexate (RHEUMATREX) 2.5 MG tablet Take 5 tablets by mouth weekly as directed. 12/11/17   Colon Branch, MD  Multiple Vitamin (MULTIVITAMIN) tablet Take 1 tablet by mouth daily.    [provider]  omeprazole (PRILOSEC) 20 MG capsule Take 1 capsule (20 mg total) by mouth daily. 12/06/18   Colon Branch, MD  rosuvastatin (CRESTOR) 5 MG tablet Take 1 tablet (5 mg total) by mouth daily. 09/30/18   Colon Branch, MD    Family History Family History  Problem  Relation Age of Onset  . Heart attack Father 16       MI ,died @ 51 (56rd MI)  . Arthritis Mother        ? type, ? not RA - Living  . Heart attack Paternal Uncle 25       MI  . Arthritis Paternal Grandmother        ??RA  . Heart attack Paternal Grandfather 41       MI  . Lung cancer Maternal Grandfather        smoker  . Heart attack Paternal Uncle   . Healthy Sister   . Asthma Son   . Allergies Son   . Healthy Son        #2  . Diabetes Neg Hx   . Stroke Neg Hx   . Colon cancer Neg Hx   . Colon polyps Neg Hx   . Rectal cancer Neg Hx   . Stomach cancer Neg Hx   . Prostate cancer Neg Hx     Social History Social History   Tobacco Use  . Smoking status: Never Smoker  . Smokeless tobacco:  Never Used  Substance Use Topics  . Alcohol use: No  . Drug use: No     Allergies   Patient has no known allergies.   Review of Systems Review of Systems  Constitutional: Negative.   Skin:       Laceration of his thumb.     Physical Exam Triage Vital Signs ED Triage Vitals [12/06/18 0930]  Enc Vitals Group     BP (!) 144/95     Pulse Rate 70     Resp 20     Temp      Temp src      SpO2 97 %     Weight      Height      Head Circumference      Peak Flow      Pain Score 0     Pain Loc      Pain Edu?      Excl. in [place]?    No data found.  Updated Vital Signs BP (!) 144/95 (BP Location: Right Arm)   Pulse 70   Resp 20   SpO2 97%   Visual Acuity Right Eye Distance:   Left Eye Distance:   Bilateral Distance:    Right Eye Near:   Left Eye Near:    Bilateral Near:     Physical Exam Constitutional:      Appearance: Normal appearance.  Skin:    Comments: There is a wound present at the base of the right thumb.  It appears that 1 of the skin margins is under turn.  The Steri-Strips and sutures were removed.  There was no sign of infection.      UC Treatments / Results  Labs (all labs ordered are listed, but only abnormal results are displayed) Labs Reviewed - No data to display  EKG None  Radiology No results found.  Procedures Procedures (including critical care time)  Medications Ordered in UC Medications - No data to display  Initial Impression / Assessment and Plan / UC Course  I have reviewed the triage vital signs and the nursing notes.  Pertinent labs & imaging results that were available during my care of the patient were reviewed by me and considered in my medical decision making (see chart for details). This wound is slow to heal due to under turning of  one margin of the laceration.  I feel the safest way would be to keep the area open to clean thoroughly with soap and water 3 times a day and to avoid any topical ointments to the area.   Patient feels good with this plan.  Sutures were removed without difficulty.  Band-Aid was placed.     Final Clinical Impressions(s) / UC Diagnoses   Final diagnoses:  Visit for suture removal     Discharge Instructions     Keep area clean with soap and water. Return to clinic 2 weeks if wound does not heal. Return to clinic sooner if any signs of infection.    ED Prescriptions    None     Controlled Substance Prescriptions [place] Controlled Substance Registry consulted? Yes, I have consulted the  Controlled Substances Registry for this patient, and feel the risk/benefit ratio today is favorable for proceeding with this prescription for a controlled substance.   Darlyne Russian, MD 12/06/18 1159

## 2018-12-13 ENCOUNTER — Encounter: Payer: Self-pay | Admitting: Internal Medicine

## 2018-12-13 ENCOUNTER — Other Ambulatory Visit: Payer: Self-pay

## 2018-12-13 ENCOUNTER — Ambulatory Visit (INDEPENDENT_AMBULATORY_CARE_PROVIDER_SITE_OTHER): Payer: BLUE CROSS/BLUE SHIELD | Admitting: Internal Medicine

## 2018-12-13 DIAGNOSIS — Z0000 Encounter for general adult medical examination without abnormal findings: Secondary | ICD-10-CM | POA: Diagnosis not present

## 2018-12-13 NOTE — Progress Notes (Signed)
Subjective:    Patient ID: Robert Velazquez, male    DOB: 03-Apr-1966, 53 y.o.   MRN: [id number]  DOS:  12/13/2018 Type of visit - description: Virtual Visit via Video Note  I connected with@ on 12/15/18 at  8:20 AM EDT by a video enabled telemedicine application and verified that I am speaking with the correct person using two identifiers.   THIS ENCOUNTER IS A VIRTUAL VISIT DUE TO COVID-19 - PATIENT WAS NOT SEEN IN THE OFFICE. PATIENT HAS CONSENTED TO VIRTUAL VISIT / TELEMEDICINE VISIT   Location of patient: home  Location of provider: office  I discussed the limitations of evaluation and management by telemedicine and the availability of in person appointments. The patient expressed understanding and agreed to proceed.  History of Present Illness: CPX In general feeling well. Had a kidney stone, he passed it.  Now asymptomatic. Had right injury, healing well. HTN: Good medication compliance, BPs has been increased lately.  BP was 144/95 on 12/06/2018 at the ER.  Today 157/91 at home.   Review of Systems Diet: Room for improvement, eating more snacks here lately   Exercise: Room for improvement, not as active as before. COVID-19: Following precautions, still working the office, encourage consistent use of a mask. Specifically denies fever chills No LUTS  Other than above, a 14 point review of systems is negative    Past Medical History:  Diagnosis Date  . Arthritis    rheumatoid arthritis variant; Dr Jolee Ewing  . GERD (gastroesophageal reflux disease)   . History of chicken pox   . Hyperlipidemia    NMR LDL 140(1854/1324)TG 102,HDL 33  . Hypertension     Past Surgical History:  Procedure Laterality Date  . EPIDURAL BLOCK INJECTION  2011-2014   ? 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 KNEE SURGERY     arthoscopic x2  . MOUTH SURGERY     wisdom teeth & tooth removed from palate & re-implated  . TONSILLECTOMY      Social History   Socioeconomic History  . Marital  status: Married    Spouse name: Not on file  . Number of children: 2  . Years of education: Not on file  . Highest education level: Not on file  Occupational History  . Occupation: MARKETING AND Scientist, clinical (histocompatibility and immunogenetics): [employer]  Social Needs  . Financial resource strain: Not on file  . Food insecurity:    Worry: Not on file    Inability: Not on file  . Transportation needs:    Medical: Not on file    Non-medical: Not on file  Tobacco Use  . Smoking status: Never Smoker  . Smokeless tobacco: Never Used  Substance and Sexual Activity  . Alcohol use: No  . Drug use: No  . Sexual activity: Not on file  Lifestyle  . Physical activity:    Days per week: Not on file    Minutes per session: Not on file  . Stress: Not on file  Relationships  . Social connections:    Talks on phone: Not on file    Gets together: Not on file    Attends religious service: Not on file    Active member of club or organization: Not on file    Attends meetings of clubs or organizations: Not on file    Relationship status: Not on file  . Intimate partner violence:    Fear of current or ex partner: Not on file  Emotionally abused: Not on file    Physically abused: Not on file    Forced sexual activity: Not on file  Other Topics Concern  . Not on file  Social History Narrative   Household- pt, wife, 2 children still at home      Allergies as of 12/13/2018   No Known Allergies     Medication List       Accurate as of Dec 13, 2018 11:59 PM. Always use your most recent med list.        acetaminophen 325 MG tablet Commonly known as:  TYLENOL Take 650 mg by mouth as needed. Reported on 11/29/2015   aspirin EC 81 MG tablet Take 81 mg by mouth daily.   azelastine 0.1 % nasal spray Commonly known as:  ASTELIN Place 1 spray into both nostrils 2 (two) times daily. Use in each nostril as directed   fluticasone 50 MCG/ACT nasal spray Commonly known as:  FLONASE Place 2 sprays into both  nostrils daily as needed for allergies or rhinitis.   folic acid 517 MCG tablet Commonly known as:  FOLVITE Take 400 mcg by mouth daily.   hydrochlorothiazide 25 MG tablet Commonly known as:  HYDRODIURIL Take 1 tablet (25 mg total) by mouth daily.   losartan 100 MG tablet Commonly known as:  COZAAR Take 1 tablet (100 mg total) by mouth daily.   methotrexate 2.5 MG tablet Commonly known as:  RHEUMATREX Take 5 tablets by mouth weekly as directed.   multivitamin tablet Take 1 tablet by mouth daily.   omeprazole 20 MG capsule Commonly known as:  PRILOSEC Take 1 capsule (20 mg total) by mouth daily.   rosuvastatin 5 MG tablet Commonly known as:  CRESTOR Take 1 tablet (5 mg total) by mouth daily.      Family History  Problem Relation Age of Onset  . Heart attack Father 67       MI ,died @ 53 (42rd MI)  . Arthritis Mother        ? type, ? not RA - Living  . Heart attack Paternal Uncle 8       MI  . Arthritis Paternal Grandmother        ??RA  . Heart attack Paternal Grandfather 77       MI  . Lung cancer Maternal Grandfather        smoker  . Heart attack Paternal Uncle   . Healthy Sister   . Asthma Son   . Allergies Son   . Healthy Son        #2  . Diabetes Neg Hx   . Stroke Neg Hx   . Colon cancer Neg Hx   . Colon polyps Neg Hx   . Rectal cancer Neg Hx   . Stomach cancer Neg Hx   . Prostate cancer Neg Hx         Objective:   Physical Exam There were no vitals taken for this visit. This is video virtual visit.  Alert oriented x3, no apparent distress, completely mobile. BP today at home 157/91.  Weight 226 pounds    Assessment     Assessment (transfer to me 11-2016) Pre-diabetes: A1c 6.4 (03-2016) HTN (amlodipine= edema noted 2019) Hyperlipidemia GERD R.A. Dx ~ mid 80s + FH CAD  PLAN: Video visit, CPX HTN: Currently on HCTZ losartan.  Was well controlled until few weeks ago, now slightly elevated.  Room for improvement of diet, exercise.  That was  extensively discussed.  We agreed on monitor BPs, call in 4 weeks let me know, consider at a medication noting that previously amlodipine caused edema Hyperlipidemia: On Crestor: Checking labs Urolithiasis: Seen at the ER 11/14/2018, had a right 4 mm stone stone with some hydronephrosis, he actually passed the stone and kept it.  Currently asymptomatic.  Checking BMP, repeat imaging if creatinine is increased. Thumb laceration: Was seen on the ER 12/02/2018, still has some numbness at the flap that was suture but otherwise wound looks well per patient. FH CAD: Controlling cardiovascular risk factors.  On aspirin. Message sent with summary of recommendations RTC for labs next week RTC, routine checkup in 6 months, will arrange    I discussed the assessment and treatment plan with the patient. The patient was provided an opportunity to ask questions and all were answered. The patient agreed with the plan and demonstrated an understanding of the instructions.   The patient was advised to call back or seek an in-person evaluation if the symptoms worsen or if the condition fails to improve as anticipated.

## 2018-12-13 NOTE — Assessment & Plan Note (Addendum)
--  Td 2014; pnm 23 today; rec flu shot q year (last 07/2018) --CCS: Cscope 2016 wnl, 10 years , Dr Hilarie Fredrickson --Prostate cancer screening: PSAs- PSA was 03-2017 wnl.  This is a virtual visit, we will check a PSA --Counseled about diet and exercise --+ FH CAD: controlling CVR if, on aspirin. --Labs: CMP, FLP, PSA

## 2018-12-15 NOTE — Assessment & Plan Note (Signed)
Video visit, CPX HTN: Currently on HCTZ losartan.  Was well controlled until few weeks ago, now slightly elevated.  Room for improvement of diet, exercise.  That was extensively discussed. We agreed on monitor BPs, call in 4 weeks let me know, consider at a medication noting that previously amlodipine caused edema Hyperlipidemia: On Crestor: Checking labs Urolithiasis: Seen at the ER 11/14/2018, had a right 4 mm stone stone with some hydronephrosis, he actually passed the stone and kept it.  Currently asymptomatic.  Checking BMP, repeat imaging if creatinine is increased. Thumb laceration: Was seen on the ER 12/02/2018, still has some numbness at the flap that was suture but otherwise wound looks well per patient. FH CAD: Controlling cardiovascular risk factors.  On aspirin. Message sent with summary of recommendations RTC for labs next week RTC, routine checkup in 6 months, will arrange

## 2018-12-18 ENCOUNTER — Other Ambulatory Visit (INDEPENDENT_AMBULATORY_CARE_PROVIDER_SITE_OTHER): Payer: BLUE CROSS/BLUE SHIELD

## 2018-12-18 ENCOUNTER — Other Ambulatory Visit: Payer: Self-pay

## 2018-12-18 DIAGNOSIS — Z0000 Encounter for general adult medical examination without abnormal findings: Secondary | ICD-10-CM

## 2018-12-18 LAB — PSA: PSA: 0.42 ng/mL (ref 0.10–4.00)

## 2018-12-18 LAB — LIPID PANEL
Cholesterol: 107 mg/dL (ref 0–200)
HDL: 33.1 mg/dL — ABNORMAL LOW (ref >39.00)
LDL Cholesterol: 46 mg/dL (ref 0–99)
NonHDL: 74.26
Total CHOL/HDL Ratio: 3
Triglycerides: 142 mg/dL (ref 0.0–149.0)
VLDL: 28.4 mg/dL (ref 0.0–40.0)

## 2018-12-18 LAB — COMPREHENSIVE METABOLIC PANEL
ALT: 34 U/L (ref 0–53)
AST: 19 U/L (ref 0–37)
Albumin: 4.2 g/dL (ref 3.5–5.2)
Alkaline Phosphatase: 40 U/L (ref 39–117)
BUN: 11 mg/dL (ref 6–23)
CO2: 27 mEq/L (ref 19–32)
Calcium: 8.8 mg/dL (ref 8.4–10.5)
Chloride: 104 mEq/L (ref 96–112)
Creatinine, Ser: 0.87 mg/dL (ref 0.40–1.50)
GFR: 91.24 mL/min (ref >60.00)
Glucose, Bld: 150 mg/dL — ABNORMAL HIGH (ref 70–99)
Potassium: 3.4 mEq/L — ABNORMAL LOW (ref 3.5–5.1)
Sodium: 141 mEq/L (ref 135–145)
Total Bilirubin: 0.8 mg/dL (ref 0.2–1.2)
Total Protein: 6.2 g/dL (ref 6.0–8.3)

## 2018-12-25 ENCOUNTER — Other Ambulatory Visit: Payer: Self-pay | Admitting: Internal Medicine

## 2019-01-03 ENCOUNTER — Encounter: Payer: Self-pay | Admitting: Internal Medicine

## 2019-01-03 ENCOUNTER — Other Ambulatory Visit: Payer: Self-pay | Admitting: Internal Medicine

## 2019-01-03 MED ORDER — CARVEDILOL 12.5 MG PO TABS: 12.5000 mg | ORAL_TABLET | Freq: Two times a day (BID) | ORAL | 3 refills | Status: DC

## 2019-01-15 DIAGNOSIS — M0579 Rheumatoid arthritis with rheumatoid factor of multiple sites without organ or systems involvement: Secondary | ICD-10-CM | POA: Diagnosis not present

## 2019-01-15 DIAGNOSIS — Z79899 Other long term (current) drug therapy: Secondary | ICD-10-CM | POA: Diagnosis not present

## 2019-01-21 ENCOUNTER — Other Ambulatory Visit: Payer: Self-pay

## 2019-01-21 ENCOUNTER — Encounter: Payer: Self-pay | Admitting: Internal Medicine

## 2019-01-21 ENCOUNTER — Ambulatory Visit (INDEPENDENT_AMBULATORY_CARE_PROVIDER_SITE_OTHER): Payer: BC Managed Care – PPO | Admitting: Internal Medicine

## 2019-01-21 VITALS — BP 117/81 | HR 64 | Temp 98.1°F | Resp 18 | Ht 70.0 in | Wt 234.4 lb

## 2019-01-21 DIAGNOSIS — R7303 Prediabetes: Secondary | ICD-10-CM

## 2019-01-21 DIAGNOSIS — I10 Essential (primary) hypertension: Secondary | ICD-10-CM | POA: Diagnosis not present

## 2019-01-21 NOTE — Progress Notes (Signed)
Subjective:    Patient ID: Robert Velazquez, male    DOB: 05-05-1966, 53 y.o.   MRN: [id number]  DOS:  01/21/2019 Type of visit - description: f/u Here for follow-up of hypertension. Ambulatory BPs at home reviewed. Labs from care everywhere reviewed.   Review of Systems  Denies chest pain no difficulty breathing No lower extremity edema  Past Medical History:  Diagnosis Date  . Arthritis    rheumatoid arthritis variant; Dr Jolee Ewing  . GERD (gastroesophageal reflux disease)   . History of chicken pox   . Hyperlipidemia    NMR LDL 140(1854/1324)TG 102,HDL 33  . Hypertension     Past Surgical History:  Procedure Laterality Date  . EPIDURAL BLOCK INJECTION  2011-2014   ? 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 KNEE SURGERY     arthoscopic x2  . MOUTH SURGERY     wisdom teeth & tooth removed from palate & re-implated  . TONSILLECTOMY      Social History   Socioeconomic History  . Marital status: Married    Spouse name: Not on file  . Number of children: 2  . Years of education: Not on file  . Highest education level: Not on file  Occupational History  . Occupation: MARKETING AND Scientist, clinical (histocompatibility and immunogenetics): [employer]  Social Needs  . Financial resource strain: Not on file  . Food insecurity:    Worry: Not on file    Inability: Not on file  . Transportation needs:    Medical: Not on file    Non-medical: Not on file  Tobacco Use  . Smoking status: Never Smoker  . Smokeless tobacco: Never Used  Substance and Sexual Activity  . Alcohol use: No  . Drug use: No  . Sexual activity: Not on file  Lifestyle  . Physical activity:    Days per week: Not on file    Minutes per session: Not on file  . Stress: Not on file  Relationships  . Social connections:    Talks on phone: Not on file    Gets together: Not on file    Attends religious service: Not on file    Active member of club or organization: Not on file    Attends meetings of clubs or organizations: Not  on file    Relationship status: Not on file  . Intimate partner violence:    Fear of current or ex partner: Not on file    Emotionally abused: Not on file    Physically abused: Not on file    Forced sexual activity: Not on file  Other Topics Concern  . Not on file  Social History Narrative   Household- pt, wife, 2 children still at home      Allergies as of 01/21/2019   No Known Allergies     Medication List       Accurate as of January 21, 2019  3:46 PM. If you have any questions, ask your nurse or doctor.        acetaminophen 325 MG tablet Commonly known as:  TYLENOL Take 650 mg by mouth as needed. Reported on 11/29/2015   aspirin EC 81 MG tablet Take 81 mg by mouth daily.   azelastine 0.1 % nasal spray Commonly known as:  ASTELIN Place 1 spray into both nostrils 2 (two) times daily. Use in each nostril as directed   carvedilol 12.5 MG tablet Commonly known as:  COREG Take 1 tablet (12.5  mg total) by mouth 2 (two) times daily with a meal.   fluticasone 50 MCG/ACT nasal spray Commonly known as:  FLONASE Place 2 sprays into both nostrils daily as needed for allergies or rhinitis.   folic acid 563 MCG tablet Commonly known as:  FOLVITE Take 400 mcg by mouth daily.   hydrochlorothiazide 25 MG tablet Commonly known as:  HYDRODIURIL Take 1 tablet (25 mg total) by mouth daily.   losartan 100 MG tablet Commonly known as:  COZAAR Take 1 tablet (100 mg total) by mouth daily.   methotrexate 2.5 MG tablet Commonly known as:  RHEUMATREX Take 5 tablets by mouth weekly as directed.   multivitamin tablet Take 1 tablet by mouth daily.   omeprazole 20 MG capsule Commonly known as:  PRILOSEC Take 1 capsule (20 mg total) by mouth daily.   rosuvastatin 5 MG tablet Commonly known as:  CRESTOR Take 1 tablet (5 mg total) by mouth daily.           Objective:   Physical Exam BP 117/81 (BP Location: Right Arm, Patient Position: Sitting, Cuff Size: Large)   Pulse 64    Temp 98.1 F (36.7 C) (Oral)   Resp 18   Ht 5\' 10"  (1.778 m)   Wt 234 lb 6.4 oz (106.3 kg)   SpO2 99%   BMI 33.63 kg/m  General:   Well developed, NAD, BMI noted. HEENT:  Normocephalic . Face symmetric, atraumatic Lungs:  CTA B Normal respiratory effort, no intercostal retractions, no accessory muscle use. Heart: RRR,  no murmur. No axillary murmur No pretibial edema bilaterally Normal radial pulses, symmetric. Skin: Not pale. Not jaundice Neurologic:  alert & oriented X3.  Speech normal, gait appropriate for age and unassisted Psych--  Cognition and judgment appear intact.  Cooperative with normal attention span and concentration.  Behavior appropriate. No anxious or depressed appearing.      Assessment     Assessment (transfer to me 11-2016) Pre-diabetes: A1c 6.4 (03-2016) HTN (amlodipine= edema noted 2019) Hyperlipidemia GERD R.A. Dx ~ mid 9s + FH CAD  PLAN: HTN: Since last visit, carvedilol was added for a slightly elevated BP at home.  Since then, BP readings have decreased. Current BPs at home, left arm:   140/90, 95.  Pulse in the 70s I check the BP manually here today: Left arm 130/85, right arm 120/80. Most recent BMP 01/15/2019 from care everywhere: potassium 3.5, creatinine 0.9.  CBG 130. Plan: Continue monitoring BPs in both arms, continue present care.  Send readings in 2 months.Will base BP mgmt decisions on right arm readings to avoid hypotension. Slightly asymmetric BPs: Normal radial pulses, no chest or axillary murmur.  Observation.  Prediabetes: Last A1c 6.0 few months ago, working on his diet. RTC 6 months

## 2019-01-21 NOTE — Patient Instructions (Addendum)
   See you in November   Check the  blood pressure 2 or 3 times a week in both arms GOAL  is between 110/65 and  135/85.  Send me a report in 2 months

## 2019-01-22 DIAGNOSIS — E119 Type 2 diabetes mellitus without complications: Secondary | ICD-10-CM

## 2019-01-22 DIAGNOSIS — R7303 Prediabetes: Secondary | ICD-10-CM | POA: Insufficient documentation

## 2019-01-22 HISTORY — DX: Type 2 diabetes mellitus without complications: E11.9

## 2019-01-22 NOTE — Assessment & Plan Note (Signed)
HTN: Since last visit, carvedilol was added for a slightly elevated BP at home.  Since then, BP readings have decreased. Current BPs at home, left arm:   140/90, 95.  Pulse in the 70s I check the BP manually here today: Left arm 130/85, right arm 120/80. Most recent BMP 01/15/2019 from care everywhere: potassium 3.5, creatinine 0.9.  CBG 130. Plan: Continue monitoring BPs in both arms, continue present care.  Send readings in 2 months.Will base BP mgmt decisions on right arm readings to avoid hypotension. Slightly asymmetric BPs: Normal radial pulses, no chest or axillary murmur.  Observation.  Prediabetes: Last A1c 6.0 few months ago, working on his diet. RTC 6 months

## 2019-03-24 ENCOUNTER — Encounter: Payer: Self-pay | Admitting: Internal Medicine

## 2019-03-24 ENCOUNTER — Other Ambulatory Visit: Payer: Self-pay | Admitting: Internal Medicine

## 2019-03-25 ENCOUNTER — Other Ambulatory Visit: Payer: Self-pay | Admitting: Internal Medicine

## 2019-03-25 MED ORDER — CARVEDILOL 25 MG PO TABS: 25.0000 mg | ORAL_TABLET | Freq: Two times a day (BID) | ORAL | 3 refills | Status: DC

## 2019-04-14 ENCOUNTER — Telehealth: Payer: Self-pay | Admitting: Internal Medicine

## 2019-04-14 NOTE — Telephone Encounter (Signed)
Called pt and he states that he passed a stone and is feeling much better now. Declined appt for now.

## 2019-04-14 NOTE — Telephone Encounter (Signed)
Pt called and stated that he has a dull ache in left abdomen side. Pt states that he thinks this is a kidney stone and would like to know if something could be called in. Please advise

## 2019-04-14 NOTE — Telephone Encounter (Signed)
Needs appt

## 2019-04-15 ENCOUNTER — Encounter: Payer: Self-pay | Admitting: Internal Medicine

## 2019-04-17 DIAGNOSIS — Z79899 Other long term (current) drug therapy: Secondary | ICD-10-CM | POA: Diagnosis not present

## 2019-04-17 DIAGNOSIS — M0579 Rheumatoid arthritis with rheumatoid factor of multiple sites without organ or systems involvement: Secondary | ICD-10-CM | POA: Diagnosis not present

## 2019-06-13 ENCOUNTER — Other Ambulatory Visit: Payer: Self-pay

## 2019-06-16 ENCOUNTER — Other Ambulatory Visit: Payer: Self-pay

## 2019-06-16 ENCOUNTER — Encounter: Payer: Self-pay | Admitting: Internal Medicine

## 2019-06-16 ENCOUNTER — Ambulatory Visit (INDEPENDENT_AMBULATORY_CARE_PROVIDER_SITE_OTHER): Payer: BC Managed Care – PPO | Admitting: Internal Medicine

## 2019-06-16 VITALS — BP 135/90 | HR 56 | Temp 96.3°F | Resp 16 | Ht 70.0 in | Wt 238.4 lb

## 2019-06-16 DIAGNOSIS — Z09 Encounter for follow-up examination after completed treatment for conditions other than malignant neoplasm: Secondary | ICD-10-CM

## 2019-06-16 DIAGNOSIS — I10 Essential (primary) hypertension: Secondary | ICD-10-CM | POA: Diagnosis not present

## 2019-06-16 DIAGNOSIS — Z23 Encounter for immunization: Secondary | ICD-10-CM | POA: Diagnosis not present

## 2019-06-16 DIAGNOSIS — R739 Hyperglycemia, unspecified: Secondary | ICD-10-CM

## 2019-06-16 LAB — BASIC METABOLIC PANEL
BUN: 14 mg/dL (ref 6–23)
CO2: 30 mEq/L (ref 19–32)
Calcium: 8.9 mg/dL (ref 8.4–10.5)
Chloride: 102 mEq/L (ref 96–112)
Creatinine, Ser: 0.98 mg/dL (ref 0.40–1.50)
GFR: 79.38 mL/min (ref >60.00)
Glucose, Bld: 203 mg/dL — ABNORMAL HIGH (ref 70–99)
Potassium: 3.6 mEq/L (ref 3.5–5.1)
Sodium: 141 mEq/L (ref 135–145)

## 2019-06-16 LAB — HEMOGLOBIN A1C: Hgb A1c MFr Bld: 6.8 % — ABNORMAL HIGH (ref 4.6–6.5)

## 2019-06-16 NOTE — Progress Notes (Signed)
Subjective:    Patient ID: Robert Velazquez, male    DOB: 01/01/1966, 53 y.o.   MRN: [id number]  DOS:  06/16/2019 Type of visit - description: Follow-up Patient has been checking his blood pressures regularly, ~ 120s, the highest it has been was 153 in one opportunity. Typically it is in the high 130s low 140s. Has gained few pounds since the beginning of the year, admits to snacking more than usual at night  Wt Readings from Last 3 Encounters:  06/16/19 238 lb 6 oz (108.1 kg)  01/21/19 234 lb 6.4 oz (106.3 kg)  11/22/18 223 lb (101.2 kg)     Review of Systems Denies edema, has occasional headache Occasionally felt slightly dizzy when he stands but it last few seconds. Diet: Room for improvement. Not taking NSAIDs regularly or decongestants.   Past Medical History:  Diagnosis Date  . Arthritis    rheumatoid arthritis variant; Dr Jolee Ewing  . GERD (gastroesophageal reflux disease)   . History of chicken pox   . Hyperlipidemia    NMR LDL 140(1854/1324)TG 102,HDL 33  . Hypertension     Past Surgical History:  Procedure Laterality Date  . EPIDURAL BLOCK INJECTION  2011-2014   ? 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 KNEE SURGERY     arthoscopic x2  . MOUTH SURGERY     wisdom teeth & tooth removed from palate & re-implated  . TONSILLECTOMY      Social History   Socioeconomic History  . Marital status: Married    Spouse name: Not on file  . Number of children: 2  . Years of education: Not on file  . Highest education level: Not on file  Occupational History  . Occupation: MARKETING AND Scientist, clinical (histocompatibility and immunogenetics): [employer]  Social Needs  . Financial resource strain: Not on file  . Food insecurity    Worry: Not on file    Inability: Not on file  . Transportation needs    Medical: Not on file    Non-medical: Not on file  Tobacco Use  . Smoking status: Never Smoker  . Smokeless tobacco: Never Used  Substance and Sexual Activity  . Alcohol use: No  . Drug  use: No  . Sexual activity: Not on file  Lifestyle  . Physical activity    Days per week: Not on file    Minutes per session: Not on file  . Stress: Not on file  Relationships  . Social Herbalist on phone: Not on file    Gets together: Not on file    Attends religious service: Not on file    Active member of club or organization: Not on file    Attends meetings of clubs or organizations: Not on file    Relationship status: Not on file  . Intimate partner violence    Fear of current or ex partner: Not on file    Emotionally abused: Not on file    Physically abused: Not on file    Forced sexual activity: Not on file  Other Topics Concern  . Not on file  Social History Narrative   Household- pt, wife, 2 children still at home      Allergies as of 06/16/2019   No Known Allergies     Medication List       Accurate as of June 16, 2019 11:59 PM. If you have any questions, ask your nurse or doctor.  acetaminophen 325 MG tablet Commonly known as: TYLENOL Take 650 mg by mouth as needed. Reported on 11/29/2015   aspirin EC 81 MG tablet Take 81 mg by mouth daily.   azelastine 0.1 % nasal spray Commonly known as: ASTELIN Place 1 spray into both nostrils 2 (two) times daily. Use in each nostril as directed   carvedilol 25 MG tablet Commonly known as: COREG Take 1 tablet (25 mg total) by mouth 2 (two) times daily with a meal.   fluticasone 50 MCG/ACT nasal spray Commonly known as: FLONASE Place 2 sprays into both nostrils daily as needed for allergies or rhinitis.   folic acid A999333 MCG tablet Commonly known as: FOLVITE Take 400 mcg by mouth daily.   hydrochlorothiazide 25 MG tablet Commonly known as: HYDRODIURIL Take 1 tablet (25 mg total) by mouth daily.   losartan 100 MG tablet Commonly known as: COZAAR Take 1 tablet (100 mg total) by mouth daily.   methotrexate 2.5 MG tablet Commonly known as: RHEUMATREX Take 5 tablets by mouth weekly as  directed.   multivitamin tablet Take 1 tablet by mouth daily.   omeprazole 20 MG capsule Commonly known as: PRILOSEC Take 1 capsule (20 mg total) by mouth daily.   rosuvastatin 5 MG tablet Commonly known as: CRESTOR Take 1 tablet (5 mg total) by mouth daily.           Objective:   Physical Exam BP 135/90 (BP Location: Right Arm)   Pulse (!) 56   Temp (!) 96.3 F (35.7 C) (Temporal)   Resp 16   Ht 5\' 10"  (1.778 m)   Wt 238 lb 6 oz (108.1 kg)   SpO2 95%   BMI 34.20 kg/m  General:   Well developed, NAD, BMI noted. HEENT:  Normocephalic . Face symmetric, atraumatic Lungs:  CTA B Normal respiratory effort, no intercostal retractions, no accessory muscle use. Heart: RRR,  no murmur.  No pretibial edema bilaterally  Skin: Not pale. Not jaundice Neurologic:  alert & oriented X3.  Speech normal, gait appropriate for age and unassisted Psych--  Cognition and judgment appear intact.  Cooperative with normal attention span and concentration.  Behavior appropriate. No anxious or depressed appearing.      Assessment     Assessment (transfer to me 11-2016) Pre-diabetes: A1c 6.4 (03-2016) HTN (amlodipine= edema noted 2019) Hyperlipidemia GERD R.A. Dx ~ mid 63s + FH CAD  PLAN: Prediabetes: Last A1c excellent at 6.0.  Recheck. HTN: Currently on losartan, HCTZ, carvedilol.  Very close to his ideal BP, occasionally elevated readings. Plan: BMP, continue same meds, I stressed the need to exercise and watch diet, particularly salt intake. Encouraged to contact me before his next appointment if BP is not at goal. RTC 6 months, CPX.

## 2019-06-16 NOTE — Progress Notes (Signed)
Pre visit review using our clinic review tool, if applicable. No additional management support is needed unless otherwise documented below in the visit note. 

## 2019-06-16 NOTE — Patient Instructions (Signed)
GO TO THE LAB : Get the blood work     GO TO THE FRONT DESK Schedule your next appointment for physical exam 12/2019   Renew checking your blood pressures BP GOAL is between 110/65 and  135/85. If it is consistently higher than the lower 140s please let me know

## 2019-06-16 NOTE — Progress Notes (Signed)
HTN Home readings in 140s as well Eyes blurry Some headaches- might be related to sinuses Feeling a little dizzy sometimes Is due for eye visit in decemeber No edema or abdominal pain   A1C Said slight weight gain- snacking at 10 PM, thinks this is contributing Is trying to cut out sodas Recently going to gym 2x per week

## 2019-06-17 NOTE — Assessment & Plan Note (Signed)
Prediabetes: Last A1c excellent at 6.0.  Recheck. HTN: Currently on losartan, HCTZ, carvedilol.  Very close to his ideal BP, occasionally elevated readings. Plan: BMP, continue same meds, I stressed the need to exercise and watch diet, particularly salt intake. Encouraged to contact me before his next appointment if BP is not at goal. RTC 6 months, CPX.

## 2019-06-20 ENCOUNTER — Other Ambulatory Visit: Payer: Self-pay | Admitting: Internal Medicine

## 2019-07-13 ENCOUNTER — Encounter: Payer: Self-pay | Admitting: Internal Medicine

## 2019-07-13 DIAGNOSIS — R0981 Nasal congestion: Secondary | ICD-10-CM

## 2019-07-14 MED ORDER — FLUTICASONE PROPIONATE 50 MCG/ACT NA SUSP: 2.0000 | Freq: Every day | NASAL | 12 refills | Status: DC | PRN

## 2019-07-14 MED ORDER — AZELASTINE HCL 0.1 % NA SOLN: 1.0000 | Freq: Two times a day (BID) | NASAL | 12 refills | Status: DC

## 2019-07-17 DIAGNOSIS — Z79899 Other long term (current) drug therapy: Secondary | ICD-10-CM | POA: Diagnosis not present

## 2019-07-17 DIAGNOSIS — M0579 Rheumatoid arthritis with rheumatoid factor of multiple sites without organ or systems involvement: Secondary | ICD-10-CM | POA: Diagnosis not present

## 2019-09-20 ENCOUNTER — Other Ambulatory Visit: Payer: Self-pay | Admitting: Internal Medicine

## 2019-09-29 ENCOUNTER — Encounter: Payer: Self-pay | Admitting: Internal Medicine

## 2019-09-29 ENCOUNTER — Ambulatory Visit (INDEPENDENT_AMBULATORY_CARE_PROVIDER_SITE_OTHER): Payer: BC Managed Care – PPO | Admitting: Internal Medicine

## 2019-09-29 VITALS — Ht 70.0 in | Wt 228.0 lb

## 2019-09-29 DIAGNOSIS — K219 Gastro-esophageal reflux disease without esophagitis: Secondary | ICD-10-CM

## 2019-09-29 DIAGNOSIS — T7840XD Allergy, unspecified, subsequent encounter: Secondary | ICD-10-CM | POA: Diagnosis not present

## 2019-09-29 DIAGNOSIS — J029 Acute pharyngitis, unspecified: Secondary | ICD-10-CM

## 2019-09-29 MED ORDER — CROMOLYN SODIUM 5.2 MG/ACT NA AERS: 2.0000 | INHALATION_SPRAY | Freq: Two times a day (BID) | NASAL | 3 refills | Status: DC

## 2019-09-29 NOTE — Progress Notes (Signed)
Pre visit review using our clinic review tool, if applicable. No additional management support is needed unless otherwise documented below in the visit note. 

## 2019-09-29 NOTE — Progress Notes (Signed)
Subjective:    Patient ID: Robert Velazquez, male    DOB: 08-22-1965, 54 y.o.   MRN: [id number]  DOS:  09/29/2019 Type of visit - description: Virtual Visit via Telephone  Attempted  to make this a video visit, due to technical difficulties from the patient side it was not possible  thus we proceeded with a Virtual Visit via Telephone    I connected with above mentioned patient  by telephone and verified that I am speaking with the correct person using two identifiers.  THIS ENCOUNTER IS A VIRTUAL VISIT DUE TO COVID-19 - PATIENT WAS NOT SEEN IN THE OFFICE. PATIENT HAS CONSENTED TO VIRTUAL VISIT / TELEMEDICINE VISIT   Location of patient: home  Location of provider: office  I discussed the limitations, risks, security and privacy concerns of performing an evaluation and management service by telephone and the availability of in person appointments. I also discussed with the patient that there may be a patient responsible charge related to this service. The patient expressed understanding and agreed to proceed.  Acute  The patient reports that for the last 3 months he is waking up early in the morning with a scratchy throat and a lot of mucus pulled on the throat, this is creating cough, he coughs a few times in the morning, sputum is brownish and occasionally has some blood on it. Other than that no chronic cough or chest congestion.  No sputum production per se.  He thinks GERD has to do with it, so is taking Prilosec, has decreased his caffeine intake and changed his diet and now acid reflux is very sporadic.   Review of Systems Denies fever chills or weight loss Admits to dry mouth Denies itchy eyes or itchy nose. Occasionally he snores but is not something consistent every night No sinus pain but has some sinus congestion.   Past Medical History:  Diagnosis Date  . Arthritis    rheumatoid arthritis variant; Dr Jolee Ewing  . GERD (gastroesophageal reflux disease)   . History of  chicken pox   . Hyperlipidemia    NMR LDL 140(1854/1324)TG 102,HDL 33  . Hypertension     Past Surgical History:  Procedure Laterality Date  . EPIDURAL BLOCK INJECTION  2011-2014   ? 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 KNEE SURGERY     arthoscopic x2  . MOUTH SURGERY     wisdom teeth & tooth removed from palate & re-implated  . TONSILLECTOMY      Allergies as of 09/29/2019   No Known Allergies     Medication List       Accurate as of September 29, 2019  3:05 PM. If you have any questions, ask your nurse or doctor.        acetaminophen 325 MG tablet Commonly known as: TYLENOL Take 650 mg by mouth as needed. Reported on 11/29/2015   aspirin EC 81 MG tablet Take 81 mg by mouth daily.   azelastine 0.1 % nasal spray Commonly known as: ASTELIN Place 1 spray into both nostrils 2 (two) times daily. Use in each nostril as directed   carvedilol 25 MG tablet Commonly known as: COREG Take 1 tablet (25 mg total) by mouth 2 (two) times daily with a meal.   fluticasone 50 MCG/ACT nasal spray Commonly known as: FLONASE Place 2 sprays into both nostrils daily as needed for allergies or rhinitis.   folic acid A999333 MCG tablet Commonly known as: FOLVITE Take 400 mcg by mouth  daily.   hydrochlorothiazide 25 MG tablet Commonly known as: HYDRODIURIL Take 1 tablet (25 mg total) by mouth daily.   losartan 100 MG tablet Commonly known as: COZAAR Take 1 tablet (100 mg total) by mouth daily.   methotrexate 2.5 MG tablet Commonly known as: RHEUMATREX Take 5 tablets by mouth weekly as directed.   multivitamin tablet Take 1 tablet by mouth daily.   omeprazole 20 MG capsule Commonly known as: PRILOSEC Take 1 capsule (20 mg total) by mouth daily.   rosuvastatin 5 MG tablet Commonly known as: CRESTOR Take 1 tablet (5 mg total) by mouth daily.             Objective:   Physical Exam Ht 5\' 10"  (1.778 m)   Wt 228 lb (103.4 kg)   BMI 32.71 kg/m  This is a telephone  virtual visit, he is alert oriented x3 and in no apparent distress    Assessment      Assessment (transfer to me 11-2016) Pre-diabetes: A1c 6.4 (03-2016) HTN (amlodipine= edema noted 2019) Hyperlipidemia GERD R.A. Dx ~ mid 82s + FH CAD  PLAN: Sore throat, allergies: The patient wakes up in the morning with sore throat and mucus pooled in the throat creating cough.  Occasionally the mucus that he brings off has a small amount of blood but apparently is not actually coming from the lungs (is not sputum). In the past he was prescribed Astelin but it dry his nose too much. Plan: Continue Mucinex, restart Flonase, start cromolyn nasal spray, reassess in person in 3 weeks, will call and schedule. For now will avoid over-the-counter antihistamines because his dry mouth. GERD: Currently controlled with very sporadic breakthrough heartburn.    I discussed the assessment and treatment plan with the patient. The patient was provided an opportunity to ask questions and all were answered. The patient agreed with the plan and demonstrated an understanding of the instructions.   The patient was advised to call back or seek an in-person evaluation if the symptoms worsen or if the condition fails to improve as anticipated.  I provided 20 minutes of non-face-to-face time during this encounter.  [name], MD

## 2019-10-01 NOTE — Assessment & Plan Note (Signed)
Sore throat, allergies: The patient wakes up in the morning with sore throat and mucus pooled in the throat creating cough.  Occasionally the mucus that he brings off has a small amount of blood but apparently is not actually coming from the lungs (is not sputum). In the past he was prescribed Astelin but it dry his nose too much. Plan: Continue Mucinex, restart Flonase, start cromolyn nasal spray, reassess in person in 3 weeks, will call and schedule. For now will avoid over-the-counter antihistamines because his dry mouth. GERD: Currently controlled with very sporadic breakthrough heartburn.

## 2019-10-16 DIAGNOSIS — Z79899 Other long term (current) drug therapy: Secondary | ICD-10-CM | POA: Diagnosis not present

## 2019-10-16 DIAGNOSIS — M0579 Rheumatoid arthritis with rheumatoid factor of multiple sites without organ or systems involvement: Secondary | ICD-10-CM | POA: Diagnosis not present

## 2019-11-03 ENCOUNTER — Encounter: Payer: Self-pay | Admitting: Internal Medicine

## 2019-11-12 ENCOUNTER — Other Ambulatory Visit: Payer: Self-pay

## 2019-11-13 ENCOUNTER — Ambulatory Visit (INDEPENDENT_AMBULATORY_CARE_PROVIDER_SITE_OTHER): Payer: BC Managed Care – PPO | Admitting: Internal Medicine

## 2019-11-13 ENCOUNTER — Ambulatory Visit (HOSPITAL_BASED_OUTPATIENT_CLINIC_OR_DEPARTMENT_OTHER)
Admission: RE | Admit: 2019-11-13 | Discharge: 2019-11-13 | Disposition: A | Discharge status: Home / Self Care | Payer: BC Managed Care – PPO | Source: Ambulatory Visit | Attending: Internal Medicine | Admitting: Internal Medicine

## 2019-11-13 ENCOUNTER — Other Ambulatory Visit: Payer: Self-pay

## 2019-11-13 VITALS — BP 137/94 | HR 67 | Temp 97.1°F | Resp 18 | Ht 70.0 in | Wt 232.6 lb

## 2019-11-13 DIAGNOSIS — J029 Acute pharyngitis, unspecified: Secondary | ICD-10-CM | POA: Diagnosis not present

## 2019-11-13 DIAGNOSIS — K219 Gastro-esophageal reflux disease without esophagitis: Secondary | ICD-10-CM | POA: Diagnosis not present

## 2019-11-13 DIAGNOSIS — R042 Hemoptysis: Secondary | ICD-10-CM

## 2019-11-13 MED ORDER — PANTOPRAZOLE SODIUM 40 MG PO TBEC: 40.0000 mg | DELAYED_RELEASE_TABLET | Freq: Two times a day (BID) | ORAL | 3 refills | Status: DC

## 2019-11-13 NOTE — Patient Instructions (Addendum)
Stop Prilosec and start Protonix 40 mg before breakfast and before dinner for 1 month. After 1 month just take 1 Protonix before breakfast  Get your chest x-ray downstairs  We will refer you to ENT

## 2019-11-13 NOTE — Progress Notes (Signed)
Subjective:    Patient ID: Robert Velazquez, male    DOB: 06-25-1966, 54 y.o.   MRN: [id number]  DOS:  11/13/2019 Type of visit - description: Acute See last visit, he was added Nasalcrom for nasal congestion that seems to be better. He however continue with sore throat described as "acid in the back of the throat".  Also described as "burning at the back of the tongue". In the mornings he often times have to clear his throat, typically has mucus but occasionally sees a specks of blood.  Review of Systems Denies consistent hoarseness No neck lumps on self exam He does have occasional breakthrough heartburn.  Past Medical History:  Diagnosis Date  . Arthritis    rheumatoid arthritis variant; Dr Jolee Ewing  . GERD (gastroesophageal reflux disease)   . History of chicken pox   . Hyperlipidemia    NMR LDL 140(1854/1324)TG 102,HDL 33  . Hypertension     Past Surgical History:  Procedure Laterality Date  . EPIDURAL BLOCK INJECTION  2011-2014   ? 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 KNEE SURGERY     arthoscopic x2  . MOUTH SURGERY     wisdom teeth & tooth removed from palate & re-implated  . TONSILLECTOMY      Allergies as of 11/13/2019   No Known Allergies     Medication List       Accurate as of November 13, 2019 11:59 PM. If you have any questions, ask your nurse or doctor.        STOP taking these medications   omeprazole 20 MG capsule Commonly known as: PRILOSEC Stopped by: [name], MD     TAKE these medications   acetaminophen 325 MG tablet Commonly known as: TYLENOL Take 650 mg by mouth as needed. Reported on 11/29/2015   aspirin EC 81 MG tablet Take 81 mg by mouth daily.   carvedilol 25 MG tablet Commonly known as: COREG Take 1 tablet (25 mg total) by mouth 2 (two) times daily with a meal.   cromolyn 5.2 MG/ACT nasal spray Commonly known as: NASALCROM Place 2 sprays into both nostrils 2 (two) times daily.   fluticasone 50 MCG/ACT nasal spray Commonly  known as: FLONASE Place 2 sprays into both nostrils daily as needed for allergies or rhinitis.   folic acid A999333 MCG tablet Commonly known as: FOLVITE Take 400 mcg by mouth daily.   hydrochlorothiazide 25 MG tablet Commonly known as: HYDRODIURIL Take 1 tablet (25 mg total) by mouth daily.   losartan 100 MG tablet Commonly known as: COZAAR Take 1 tablet (100 mg total) by mouth daily.   methotrexate 2.5 MG tablet Commonly known as: RHEUMATREX Take 5 tablets by mouth weekly as directed.   multivitamin tablet Take 1 tablet by mouth daily.   pantoprazole 40 MG tablet Commonly known as: PROTONIX Take 1 tablet (40 mg total) by mouth 2 (two) times daily before a meal. Started by: [name], MD   rosuvastatin 5 MG tablet Commonly known as: CRESTOR Take 1 tablet (5 mg total) by mouth daily.          Objective:   Physical Exam BP (!) 137/94 (BP Location: Left Arm, Patient Position: Sitting, Cuff Size: Large)   Pulse 67   Temp (!) 97.1 F (36.2 C) (Temporal)   Resp 18   Ht 5\' 10"  (1.778 m)   Wt 232 lb 9.6 oz (105.5 kg)   SpO2 96%   BMI 33.37 kg/m  General:   Well developed, NAD, BMI noted. HEENT:  Normocephalic . Face symmetric, atraumatic. Left ear normal Right ear normal except for small amount of wax in the canal Nose and nostrils: Normal Throat: No lesions, no ulcers no redness.  Tongue seems normal Neck: No thyromegaly, no supraclavicular mass Lungs:  CTA B Normal respiratory effort, no intercostal retractions, no accessory muscle use. Heart: RRR,  no murmur.  Lower extremities: no pretibial edema bilaterally  Skin: Not pale. Not jaundice Neurologic:  alert & oriented X3.  Speech normal, gait appropriate for age and unassisted Psych--  Cognition and judgment appear intact.  Cooperative with normal attention span and concentration.  Behavior appropriate. No anxious or depressed appearing.      Assessment     Assessment (transfer to me  11-2016) Pre-diabetes: A1c 6.4 (03-2016) HTN (amlodipine= edema noted 2019) Hyperlipidemia GERD R.A. Dx ~ mid 80s + FH CAD  PLAN: Sore throat: See last visit, currently on Flonase and cromolyn nasal spray, overall allergy symptoms are better but he continues with sore throat described as a burning sensation. He still sees occasionally specks of blood in the pooled mucus at the throat. He does have breakthrough heartburn. Plan: ENT referral for a thorough examination, stop Prilosec, Protonix twice daily for a month then daily. GERD: See comments above Hemoptysis?  Specks of blood in the mucus he brings up in the morning, I doubt this is true hemoptysis but will get a chest x-ray for completeness. RTC scheduled for May 2021    This visit occurred during the SARS-CoV-2 public health emergency.  Safety protocols were in place, including screening questions prior to the visit, additional usage of staff PPE, and extensive cleaning of exam room while observing appropriate contact time as indicated for disinfecting solutions.

## 2019-11-15 ENCOUNTER — Encounter: Payer: Self-pay | Admitting: Internal Medicine

## 2019-11-15 NOTE — Assessment & Plan Note (Signed)
Sore throat: See last visit, currently on Flonase and cromolyn nasal spray, overall allergy symptoms are better but he continues with sore throat described as a burning sensation. He still sees occasionally specks of blood in the pooled mucus at the throat. He does have breakthrough heartburn. Plan: ENT referral for a thorough examination, stop Prilosec, Protonix twice daily for a month then daily. GERD: See comments above Hemoptysis?  Specks of blood in the mucus he brings up in the morning, I doubt this is true hemoptysis but will get a chest x-ray for completeness. RTC scheduled for May 2021

## 2019-12-05 ENCOUNTER — Other Ambulatory Visit: Payer: Self-pay | Admitting: Internal Medicine

## 2019-12-05 DIAGNOSIS — R198 Other specified symptoms and signs involving the digestive system and abdomen: Secondary | ICD-10-CM

## 2019-12-05 DIAGNOSIS — R0989 Other specified symptoms and signs involving the circulatory and respiratory systems: Secondary | ICD-10-CM

## 2019-12-05 MED ORDER — PANTOPRAZOLE SODIUM 40 MG PO TBEC: 40.0000 mg | DELAYED_RELEASE_TABLET | Freq: Two times a day (BID) | ORAL | 3 refills | Status: DC

## 2019-12-16 ENCOUNTER — Other Ambulatory Visit: Payer: Self-pay

## 2019-12-16 ENCOUNTER — Ambulatory Visit (INDEPENDENT_AMBULATORY_CARE_PROVIDER_SITE_OTHER): Payer: BC Managed Care – PPO | Admitting: Internal Medicine

## 2019-12-16 ENCOUNTER — Encounter: Payer: Self-pay | Admitting: Internal Medicine

## 2019-12-16 VITALS — BP 129/90 | HR 56 | Temp 96.1°F | Resp 18 | Ht 70.0 in | Wt 229.4 lb

## 2019-12-16 DIAGNOSIS — I10 Essential (primary) hypertension: Secondary | ICD-10-CM

## 2019-12-16 DIAGNOSIS — Z0000 Encounter for general adult medical examination without abnormal findings: Secondary | ICD-10-CM

## 2019-12-16 DIAGNOSIS — E785 Hyperlipidemia, unspecified: Secondary | ICD-10-CM | POA: Diagnosis not present

## 2019-12-16 DIAGNOSIS — Z8249 Family history of ischemic heart disease and other diseases of the circulatory system: Secondary | ICD-10-CM | POA: Diagnosis not present

## 2019-12-16 DIAGNOSIS — J312 Chronic pharyngitis: Secondary | ICD-10-CM | POA: Diagnosis not present

## 2019-12-16 DIAGNOSIS — E119 Type 2 diabetes mellitus without complications: Secondary | ICD-10-CM

## 2019-12-16 LAB — COMPREHENSIVE METABOLIC PANEL
ALT: 32 U/L (ref 0–53)
AST: 21 U/L (ref 0–37)
Albumin: 4.3 g/dL (ref 3.5–5.2)
Alkaline Phosphatase: 41 U/L (ref 39–117)
BUN: 14 mg/dL (ref 6–23)
CO2: 30 mEq/L (ref 19–32)
Calcium: 8.8 mg/dL (ref 8.4–10.5)
Chloride: 99 mEq/L (ref 96–112)
Creatinine, Ser: 0.97 mg/dL (ref 0.40–1.50)
GFR: 80.18 mL/min (ref >60.00)
Glucose, Bld: 282 mg/dL — ABNORMAL HIGH (ref 70–99)
Potassium: 3.6 mEq/L (ref 3.5–5.1)
Sodium: 137 mEq/L (ref 135–145)
Total Bilirubin: 1.4 mg/dL — ABNORMAL HIGH (ref 0.2–1.2)
Total Protein: 6.2 g/dL (ref 6.0–8.3)

## 2019-12-16 LAB — CBC WITH DIFFERENTIAL/PLATELET
Basophils Absolute: 0 10*3/uL (ref 0.0–0.1)
Basophils Relative: 1 % (ref 0.0–3.0)
Eosinophils Absolute: 0.1 10*3/uL (ref 0.0–0.7)
Eosinophils Relative: 2.4 % (ref 0.0–5.0)
HCT: 46.6 % (ref 39.0–52.0)
Hemoglobin: 16.1 g/dL (ref 13.0–17.0)
Lymphocytes Relative: 40.5 % (ref 12.0–46.0)
Lymphs Abs: 2 10*3/uL (ref 0.7–4.0)
MCHC: 34.5 g/dL (ref 30.0–36.0)
MCV: 91.1 fl (ref 78.0–100.0)
Monocytes Absolute: 0.4 10*3/uL (ref 0.1–1.0)
Monocytes Relative: 9 % (ref 3.0–12.0)
Neutro Abs: 2.3 10*3/uL (ref 1.4–7.7)
Neutrophils Relative %: 47.1 % (ref 43.0–77.0)
Platelets: 154 10*3/uL (ref 150.0–400.0)
RBC: 5.12 Mil/uL (ref 4.22–5.81)
RDW: 14.2 % (ref 11.5–15.5)
WBC: 4.8 10*3/uL (ref 4.0–10.5)

## 2019-12-16 LAB — LIPID PANEL
Cholesterol: 107 mg/dL (ref 0–200)
HDL: 27.7 mg/dL — ABNORMAL LOW (ref >39.00)
NonHDL: 79.36
Total CHOL/HDL Ratio: 4
Triglycerides: 273 mg/dL — ABNORMAL HIGH (ref 0.0–149.0)
VLDL: 54.6 mg/dL — ABNORMAL HIGH (ref 0.0–40.0)

## 2019-12-16 LAB — TSH: TSH: 1.36 u[IU]/mL (ref 0.35–4.50)

## 2019-12-16 LAB — LDL CHOLESTEROL, DIRECT: Direct LDL: 50 mg/dL

## 2019-12-16 LAB — HEMOGLOBIN A1C: Hgb A1c MFr Bld: 9.6 % — ABNORMAL HIGH (ref 4.6–6.5)

## 2019-12-16 MED ORDER — CHLORTHALIDONE 25 MG PO TABS: 25.0000 mg | ORAL_TABLET | Freq: Every day | ORAL | 0 refills | Status: DC

## 2019-12-16 MED ORDER — METFORMIN HCL 850 MG PO TABS: ORAL_TABLET | ORAL | 3 refills | Status: DC

## 2019-12-16 NOTE — Assessment & Plan Note (Deleted)
Here for CPX [id number] A1c was 6.8, at a diabetic level, has changed his diet, check a A1c. Addendum: A1c today increased to 9.6, patient called, start Metformin.  Will come back in 3 months rather than 4 HTN: Does not seem to be controlled, ambulatory BPs consistently in the 140/100,  130/90.  He has an arm cuff.  Good med compliance. Plan: Continue carvedilol 25 mg twice a day, losartan 100 mg daily, stop HCTZ 25 mg and start chlorthalidone 25 mg.  BMP in 2 weeks. Sore throat: See last visit, slightly better after a increased temporarily PPIs.  Has not seen ENT, will refer him again. Hemoptysis?  Continue to see specks of blood in the mucus he brings up in the morning, chest x-ray was negative, doubt true hemoptysis.  Reassess in 4 months.  Snoring: Admits to snoring, no other OSA symptoms.  Consider formal evaluation. RTC 4 months

## 2019-12-16 NOTE — Assessment & Plan Note (Signed)
Here for CPX [id number] A1c was 6.8, at a diabetic level, has changed his diet, check a A1c. Addendum: A1c today increased to 9.6, patient called, start Metformin.  Will come back in 3 months rather than 4 HTN: Does not seem to be controlled, ambulatory BPs consistently in the 140/100,  130/90.  He has an arm cuff.  Good med compliance. Plan: Continue carvedilol 25 mg twice a day, losartan 100 mg daily, stop HCTZ 25 mg and start chlorthalidone 25 mg.  BMP in 2 weeks. Sore throat: See last visit, slightly better after a increased temporarily PPIs.  Has not seen ENT, will refer him again. Hemoptysis?  Continue to see specks of blood in the mucus he brings up in the morning, chest x-ray was negative, doubt true hemoptysis.  Reassess in 4 months.  Snoring: Admits to snoring, no other OSA symptoms.  Consider formal evaluation. RTC 4 months

## 2019-12-16 NOTE — Progress Notes (Signed)
Pre visit review using our clinic review tool, if applicable. No additional management support is needed unless otherwise documented below in the visit note. 

## 2019-12-16 NOTE — Progress Notes (Signed)
Subjective:    Patient ID: Robert Velazquez, male    DOB: 09-Jun-1966, 54 y.o.   MRN: [id number]  DOS:  12/16/2019 Type of visit - description: CPX  Here for CPX, we also discussed other issues.  See last visit. Sore throat: Somewhat better but not completely okay.  He admits to some snoring but does not feel sleepy or fatigued. There was also question of hemoptysis, still has mucus pooled in the throat in the morning and occasionally sees specks of blood there.  Does not think the mucus is coming from the lungs. Ambulatory BPs slightly elevated  BP Readings from Last 3 Encounters:  12/16/19 129/90  11/13/19 (!) 137/94  06/16/19 135/90     Review of Systems  Other than above, a 14 point review of systems is negative      Past Medical History:  Diagnosis Date  . Arthritis    rheumatoid arthritis variant; Dr Robert Velazquez  . GERD (gastroesophageal reflux disease)   . History of chicken pox   . Hyperlipidemia    NMR LDL 140(1854/1324)TG 102,HDL 33  . Hypertension     Past Surgical History:  Procedure Laterality Date  . EPIDURAL BLOCK INJECTION  2011-2014   ? 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 KNEE SURGERY     arthoscopic x2  . MOUTH SURGERY     wisdom teeth & tooth removed from palate & re-implated  . TONSILLECTOMY     Family History  Problem Relation Age of Onset  . Heart attack Father 60       MI ,died @ 5 (58rd MI)  . Arthritis Mother        ? type, ? not RA - Living  . Heart attack Paternal Uncle 37       MI  . Arthritis Paternal Grandmother        ??RA  . Heart attack Paternal Grandfather 29       MI  . Lung cancer Maternal Grandfather        smoker  . Heart attack Paternal Uncle   . Healthy Sister   . Asthma Son   . Allergies Son   . Healthy Son        #2  . Diabetes Neg Hx   . Stroke Neg Hx   . Colon cancer Neg Hx   . Colon polyps Neg Hx   . Rectal cancer Neg Hx   . Stomach cancer Neg Hx   . Prostate cancer Neg Hx     Allergies as of 12/16/2019    No Known Allergies     Medication List       Accurate as of Dec 16, 2019  4:23 PM. If you have any questions, ask your nurse or doctor.        STOP taking these medications   hydrochlorothiazide 25 MG tablet Commonly known as: HYDRODIURIL Stopped by: Robert November, MD     TAKE these medications   acetaminophen 325 MG tablet Commonly known as: TYLENOL Take 650 mg by mouth as needed. Reported on 11/29/2015   aspirin EC 81 MG tablet Take 81 mg by mouth daily.   carvedilol 25 MG tablet Commonly known as: COREG Take 1 tablet (25 mg total) by mouth 2 (two) times daily with a meal.   chlorthalidone 25 MG tablet Commonly known as: HYGROTON Take 1 tablet (25 mg total) by mouth daily. Started by: Robert November, MD   CINNAMON PO Take by mouth.  cromolyn 5.2 MG/ACT nasal spray Commonly known as: NASALCROM Place 2 sprays into both nostrils 2 (two) times daily.   fluticasone 50 MCG/ACT nasal spray Commonly known as: FLONASE Place 2 sprays into both nostrils daily as needed for allergies or rhinitis.   folic acid A999333 MCG tablet Commonly known as: FOLVITE Take 400 mcg by mouth daily.   losartan 100 MG tablet Commonly known as: COZAAR Take 1 tablet (100 mg total) by mouth daily.   metFORMIN 850 MG tablet Commonly known as: GLUCOPHAGE Half tablet twice a day for the first week, then 1 tablet twice a day Started by: Robert November, MD   methotrexate 2.5 MG tablet Commonly known as: RHEUMATREX Take 5 tablets by mouth weekly as directed.   multivitamin tablet Take 1 tablet by mouth daily.   pantoprazole 40 MG tablet Commonly known as: PROTONIX Take 1 tablet (40 mg total) by mouth 2 (two) times daily before a meal.   rosuvastatin 5 MG tablet Commonly known as: CRESTOR Take 1 tablet (5 mg total) by mouth daily.   TURMERIC PO Take by mouth.          Objective:   Physical Exam BP 129/90 (BP Location: Left Arm, Patient Position: Sitting, Cuff Size: Normal)   Pulse (!) 56    Temp (!) 96.1 F (35.6 C) (Temporal)   Resp 18   Ht 5\' 10"  (1.778 m)   Wt 229 lb 6 oz (104 kg)   SpO2 97%   BMI 32.91 kg/m  General: Well developed, NAD, BMI noted Neck: No  thyromegaly, no LAD or mass. HEENT:  Normocephalic . Face symmetric, atraumatic Lungs:  CTA B Normal respiratory effort, no intercostal retractions, no accessory muscle use. Heart: RRR,  no murmur.  Abdomen:  Not distended, soft, non-tender. No rebound or rigidity.   Lower extremities: no pretibial edema bilaterally  Skin: Exposed areas without rash. Not pale. Not jaundice Neurologic:  alert & oriented X3.  Speech normal, gait appropriate for age and unassisted Strength symmetric and appropriate for age.  Psych: Cognition and judgment appear intact.  Cooperative with normal attention span and concentration.  Behavior appropriate. No anxious or depressed appearing.     Assessment    Assessment (transfer to me 11-2016) Diabetes (prediabetic since 2017)  HTN (amlodipine= edema noted 2019) Hyperlipidemia GERD R.A. Dx ~ mid 21s + FH CAD  PLAN: Here for CPX [id number] A1c was 6.8, at a diabetic level, has changed his diet, check a A1c. Addendum: A1c today increased to 9.6, patient called, start Metformin.  Will come back in 3 months rather than 4 HTN: Does not seem to be controlled, ambulatory BPs consistently in the 140/100,  130/90.  He has an arm cuff.  Good med compliance. Plan: Continue carvedilol 25 mg twice a day, losartan 100 mg daily, stop HCTZ 25 mg and start chlorthalidone 25 mg.  BMP in 2 weeks. Sore throat: See last visit, slightly better after a increased temporarily PPIs.  Has not seen ENT, will refer him again. Hemoptysis?  Continue to see specks of blood in the mucus he brings up in the morning, chest x-ray was negative, doubt true hemoptysis.  Reassess in 4 months.  Snoring: Admits to snoring, no other OSA symptoms.  Consider formal evaluation. RTC 4 months  In addition to CPX, we  address multiple other issues poorly controlled diabetes, hypertension.  Also sore throat, question of hemoptysis and snoring.  This visit occurred during the SARS-CoV-2 public health emergency.  Safety protocols were  in place, including screening questions prior to the visit, additional usage of staff PPE, and extensive cleaning of exam room while observing appropriate contact time as indicated for disinfecting solutions.

## 2019-12-16 NOTE — Assessment & Plan Note (Signed)
--  Td 2014 - pnm 23: 12/2018 - s/p Covid shot (JJ) --CCS: Cscope 2016 wnl, 10 years , Dr Hilarie Fredrickson --Prostate cancer screening: PSA 12/2018 wnl -- Diet /exercise: Has made some positive changes including eating less carbohydrates. --+ FH CAD: Asymptomatic, patient asked to see a cardiologist for further advice regards CV RF reduction.  Will refer him --Labs: CMP, FLP, PSA, A1c

## 2019-12-16 NOTE — Patient Instructions (Signed)
Stop taking hydrochlorothiazide Start chlorthalidone 25 mg daily    We will refer you to a cardiologist  We will refer you again to the ENT doctor regarding your sore throat.  If they do not call you within the next few days please let me know  Continue checking your blood pressures BP GOAL is between 110/65 and  135/85. If it is consistently higher or lower, let me know   GO TO THE LAB : Get the blood work     [place], [place] back for your blood work in 2 weeks     Come back for a checkup in 4 months    DIABETES self learn tools:  Consider getting one of the following books: "Program for Reversing Diabetes" (solid advise but may not include the latest medicines available) "Diabetes for Dummies"  Online resources:  The American diabetes Association     diabetes.[facility] website it is a Microbiologist  [place].org  The [facility] web site has a diabetes section  [employer]

## 2019-12-26 ENCOUNTER — Other Ambulatory Visit: Payer: Self-pay

## 2019-12-26 ENCOUNTER — Encounter: Payer: Self-pay | Admitting: Cardiology

## 2019-12-26 ENCOUNTER — Ambulatory Visit (INDEPENDENT_AMBULATORY_CARE_PROVIDER_SITE_OTHER): Payer: BC Managed Care – PPO | Admitting: Cardiology

## 2019-12-26 VITALS — BP 132/88 | HR 58 | Ht 70.0 in | Wt 229.0 lb

## 2019-12-26 DIAGNOSIS — E119 Type 2 diabetes mellitus without complications: Secondary | ICD-10-CM

## 2019-12-26 DIAGNOSIS — E785 Hyperlipidemia, unspecified: Secondary | ICD-10-CM | POA: Diagnosis not present

## 2019-12-26 DIAGNOSIS — Z8249 Family history of ischemic heart disease and other diseases of the circulatory system: Secondary | ICD-10-CM

## 2019-12-26 DIAGNOSIS — M069 Rheumatoid arthritis, unspecified: Secondary | ICD-10-CM

## 2019-12-26 DIAGNOSIS — I10 Essential (primary) hypertension: Secondary | ICD-10-CM

## 2019-12-26 NOTE — Progress Notes (Signed)
Cardiology Consultation:    Date:  12/26/2019   ID:  Robert Velazquez, DOB Mar 03, 1966, MRN [id number]  PCP:  Colon Branch, MD  Cardiologist:  Jenne Campus, MD   Referring MD: Colon Branch, MD   Chief Complaint  Patient presents with  . New Patient (Initial Visit)  I would like to be establish as a patient in the clinic  History of Present Illness:    Robert Velazquez is a 54 y.o. male who is being seen today for the evaluation of multiple risk factors for coronary artery disease at the request of Colon Branch, MD.  He has multiple risk factors for coronary artery disease that include especially premature coronary artery disease in the family.  His father started having problem at his 8s he eventually in the passing in his 46s.  Multiple other family members have premature coronary artery disease he is very aware of this and conscious of that and try to do everything what he can to prevent having the same fate as of his family members.  He does have hypertension dyslipidemia he is taking statin only 5 mg Crestor, also started developing diabetes.  He would like to be establish in our clinic as a patient and he would like to do everything we can to avoid problems in the future.  Denies have any chest pain tightness squeezing pressure burning chest.  He is to go to the gym and exercise on the regular basis but never since COVID-19 pandemic he does not do it.  Recently started being a little more active.  Because of lack of activity he gained significant amount of weight and is very upset about it.  Does get shortness of breath with exercise.  There is no swelling of lower extremities there is no snoring at night. His blood pressure is fairly controlled with medications. His cholesterol showed very low HDL. He never had any heart trouble.  He never had any chest tightness.  Past Medical History:  Diagnosis Date  . Annual physical exam 11/20/2014  . Arthritis    rheumatoid arthritis variant; Dr  Jolee Ewing  . Chronic nasal congestion 09/24/2015  . Diabetes mellitus without complication ([place]) 0000000  . Essential hypertension 08/18/2010   Qualifier: Diagnosis of  By: Linna Darner MD, Gwyndolyn Saxon    . Family history of early CAD 11/20/2014  . GERD (gastroesophageal reflux disease)   . 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 Globus sensation 12/22/2015  . History of chicken pox   . Hyperlipidemia    NMR LDL 140(1854/1324)TG 102,HDL 33  . Hypertension   . Rheumatoid arthritis ([place]) 02/09/2010   Qualifier: Diagnosis of  By: Inda Castle FNP, [place] Hampshire  Dr Charlestine Night, Rheumatology      Past Surgical History:  Procedure Laterality Date  . EPIDURAL BLOCK INJECTION  2011-2014   ? 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 KNEE SURGERY     arthoscopic x2  . MOUTH SURGERY     wisdom teeth & tooth removed from palate & re-implated  . TONSILLECTOMY      Current Medications: Current Meds  Medication Sig  . acetaminophen (TYLENOL) 325 MG tablet Take 650 mg by mouth as needed. Reported on 11/29/2015  . carvedilol (COREG) 25 MG tablet Take 1 tablet (25 mg total) by mouth 2 (two) times daily with a meal.  . chlorthalidone (HYGROTON) 25 MG tablet Take 1 tablet (25 mg total) by  mouth daily.  Marland Kitchen CINNAMON PO Take by mouth.  . cromolyn (NASALCROM) 5.2 MG/ACT nasal spray Place 2 sprays into both nostrils 2 (two) times daily.  . fluticasone (FLONASE) 50 MCG/ACT nasal spray Place 2 sprays into both nostrils daily as needed for allergies or rhinitis.  . folic acid (FOLVITE) A999333 MCG tablet Take 400 mcg by mouth daily.  Marland Kitchen losartan (COZAAR) 100 MG tablet Take 1 tablet (100 mg total) by mouth daily.  . metFORMIN (GLUCOPHAGE) 850 MG tablet Half tablet twice a day for the first week, then 1 tablet twice a day  . methotrexate (RHEUMATREX) 2.5 MG tablet Take 5 tablets by mouth weekly as directed.  . Multiple Vitamin (MULTIVITAMIN) tablet Take 1 tablet by mouth daily.  .  pantoprazole (PROTONIX) 40 MG tablet Take 1 tablet (40 mg total) by mouth 2 (two) times daily before a meal.  . rosuvastatin (CRESTOR) 5 MG tablet Take 1 tablet (5 mg total) by mouth daily.  . TURMERIC PO Take by mouth.     Allergies:   Patient has no known allergies.   Social History   Socioeconomic History  . Marital status: Married    Spouse name: Not on file  . Number of children: 2  . Years of education: Not on file  . Highest education level: Not on file  Occupational History  . Occupation: MARKETING AND Scientist, clinical (histocompatibility and immunogenetics): [employer]  Tobacco Use  . Smoking status: Never Smoker  . Smokeless tobacco: Never Used  Substance and Sexual Activity  . Alcohol use: No  . Drug use: No  . Sexual activity: Not on file  Other Topics Concern  . Not on file  Social History Narrative   Household- pt, wife, 1 children still at home, other child @ [employer]    Social Determinants of Health   Financial Resource Strain:   . Difficulty of Paying Living Expenses:   Food Insecurity:   . Worried About Charity fundraiser in the Last Year:   . Arboriculturist in the Last Year:   Transportation Needs:   . Film/video editor (Medical):   Marland Kitchen Lack of Transportation (Non-Medical):   Physical Activity:   . Days of Exercise per Week:   . Minutes of Exercise per Session:   Stress:   . Feeling of Stress :   Social Connections:   . Frequency of Communication with Friends and Family:   . Frequency of Social Gatherings with Friends and Family:   . Attends Religious Services:   . Active Member of Clubs or Organizations:   . Attends Archivist Meetings:   Marland Kitchen Marital Status:      Family History: The patient's family history includes Allergies in his son; Arthritis in his mother and paternal grandmother; Asthma in his son; Healthy in his sister and son; Heart attack in his paternal uncle; Heart attack (age of onset: 30) in his father; Heart attack (age of onset: 101) in his  paternal grandfather and paternal uncle; Lung cancer in his maternal grandfather. There is no history of Diabetes, Stroke, Colon cancer, Colon polyps, Rectal cancer, Stomach cancer, or Prostate cancer. ROS:   Please see the history of present illness.    All 14 point review of systems negative except as described per history of present illness.  EKGs/Labs/Other Studies Reviewed:    The following studies were reviewed today:   EKG:  EKG is  ordered today.  The ekg ordered today demonstrates  normal sinus rhythm, normal P interval, no ST segment changes.  Recent Labs: 12/16/2019: ALT 32; BUN 14; Creatinine, Ser 0.97; Hemoglobin 16.1; Platelets 154.0; Potassium 3.6; Sodium 137; TSH 1.36  Recent Lipid Panel    Component Value Date/Time   CHOL 107 12/16/2019 0846   TRIG 273.0 (H) 12/16/2019 0846   TRIG 109 06/28/2006 0842   HDL 27.70 (L) 12/16/2019 0846   CHOLHDL 4 12/16/2019 0846   VLDL 54.6 (H) 12/16/2019 0846   LDLCALC 46 12/18/2018 0806   LDLDIRECT 50.0 12/16/2019 0846    Physical Exam:    VS:  BP 132/88   Pulse (!) 58   Ht 5\' 10"  (1.778 m)   Wt 229 lb (103.9 kg)   SpO2 96%   BMI 32.86 kg/m     Wt Readings from Last 3 Encounters:  12/26/19 229 lb (103.9 kg)  12/16/19 229 lb 6 oz (104 kg)  11/13/19 232 lb 9.6 oz (105.5 kg)     GEN:  Well nourished, well developed in no acute distress HEENT: Normal NECK: No JVD; No carotid bruits LYMPHATICS: No lymphadenopathy CARDIAC: RRR, no murmurs, no rubs, no gallops RESPIRATORY:  Clear to auscultation without rales, wheezing or rhonchi  ABDOMEN: Soft, non-tender, non-distended MUSCULOSKELETAL:  No edema; No deformity  SKIN: Warm and dry NEUROLOGIC:  Alert and oriented x 3 PSYCHIATRIC:  Normal affect   ASSESSMENT:    1. Hyperlipidemia, unspecified hyperlipidemia type   2. Essential hypertension   3. Diabetes mellitus without complication ([place])   4. Rheumatoid arthritis involving multiple sites, unspecified whether rheumatoid  factor present ([place])   5. Family history of early CAD    PLAN:    In order of problems listed above:  1. Hyperlipidemia apparently he was in some research study before and he would like to see lipid specialist.  I will refer him to our lipid clinic.  In the meantime we will will continue with statin.  We did talk also about healthy lifestyle good diet and exercises on a regular basis. 2. Essential hypertension this is first visit in the office slightly elevated we will continue monitoring and we did talk about nonpharmacological way to reduce blood pressure which will be exercises on the regular basis and avoiding salt. 3. Diabetes is on Metformin.  His hemoglobin A1c is still high.  We did talk about the fact that the exercises on the regular basis and good diet can improve this problem to may even complete elimination of diabetes. 4. Rheumatoid arthritis followed by rheumatologist he is on methotrexate. 5. Family history of early CAD.  Noted obviously concerning. 6. Healthy lifestyle: We spent a great long time talking about exercises I recommended at least 5 times a week for 30 minutes and favorably more than that favorably 6 to 7 days a week for about 1 hour.  He used to go to gym but he spent time mostly lifting weights and doing some stretching exercises I told him it was important however from cardiovascular point review the most important exercise will be aerobic exercise. 7. To him what kind of exercises he can do anyhow.  Also warned him and advised him to gradually slowly start exercising.  We did talk also about diet.  I explained to him the basic of Mediterranean diet I explained to him the study that was done did show significant improvement with the diet.  I also pointed some dietary problems that he suffered from for example eating late at night and I told him  that he need to change that. 8. Overall he was very receptive to my advises and hopefully he will do well implementing those.  I  do not see any evidence of active coronary artery disease at this stage.  He is completely asymptomatic.  I do not see any need to do any ischemia work-up at this stage however careful monitoring risk factors modification need to happen.   Medication Adjustments/Labs and Tests Ordered: Current medicines are reviewed at length with the patient today.  Concerns regarding medicines are outlined above.  Orders Placed This Encounter  Procedures  . AMB Referral to Advanced Lipid Disorders Clinic  . EKG 12-Lead   No orders of the defined types were placed in this encounter.   Signed, Park Liter, MD, [facility]. 12/26/2019 4:48 PM    [facility]

## 2019-12-26 NOTE — Patient Instructions (Signed)
Medication Instructions:  Your physician recommends that you continue on your current medications as directed. Please refer to the Current Medication list given to you today.  *If you need a refill on your cardiac medications before your next appointment, please call your pharmacy*   Lab Work: None.  If you have labs (blood work) drawn today and your tests are completely normal, you will receive your results only by: Marland Kitchen MyChart Message (if you have MyChart) OR . A paper copy in the mail If you have any lab test that is abnormal or we need to change your treatment, we will call you to review the results.   Testing/Procedures: None.    Follow-Up: At [facility], you and your health needs are our priority.  As part of our continuing mission to provide you with exceptional heart care, we have created designated Provider Care Teams.  These Care Teams include your primary Cardiologist (physician) and Advanced Practice Providers (APPs -  Physician Assistants and Nurse Practitioners) who all work together to provide you with the care you need, when you need it.  We recommend signing up for the patient portal called "MyChart".  Sign up information is provided on this After Visit Summary.  MyChart is used to connect with patients for Virtual Visits (Telemedicine).  Patients are able to view lab/test results, encounter notes, upcoming appointments, etc.  Non-urgent messages can be sent to your provider as well.   To learn more about what you can do with MyChart, go to [email or web address].    Your next appointment:   3 month(s)  The format for your next appointment:   In Person  Provider:   Jenne Campus, MD   Other Instructions  Dr. Agustin Cree has referred you to the lipid clinic they should call you within 1 week please call our office if you don't hear from them.

## 2019-12-27 ENCOUNTER — Encounter: Payer: Self-pay | Admitting: Internal Medicine

## 2019-12-30 ENCOUNTER — Other Ambulatory Visit: Payer: Self-pay

## 2019-12-30 ENCOUNTER — Telehealth: Payer: Self-pay | Admitting: Internal Medicine

## 2019-12-30 ENCOUNTER — Other Ambulatory Visit (INDEPENDENT_AMBULATORY_CARE_PROVIDER_SITE_OTHER): Payer: BC Managed Care – PPO

## 2019-12-30 DIAGNOSIS — I10 Essential (primary) hypertension: Secondary | ICD-10-CM

## 2019-12-30 LAB — BASIC METABOLIC PANEL
BUN: 14 mg/dL (ref 6–23)
CO2: 28 mEq/L (ref 19–32)
Calcium: 8.7 mg/dL (ref 8.4–10.5)
Chloride: 101 mEq/L (ref 96–112)
Creatinine, Ser: 1.01 mg/dL (ref 0.40–1.50)
GFR: 76.52 mL/min (ref >60.00)
Glucose, Bld: 223 mg/dL — ABNORMAL HIGH (ref 70–99)
Potassium: 3.1 mEq/L — ABNORMAL LOW (ref 3.5–5.1)
Sodium: 139 mEq/L (ref 135–145)

## 2019-12-30 MED ORDER — POTASSIUM CHLORIDE CRYS ER 10 MEQ PO TBCR: 10.0000 meq | EXTENDED_RELEASE_TABLET | Freq: Every day | ORAL | 0 refills | Status: DC

## 2019-12-30 NOTE — Telephone Encounter (Signed)
-----   Message from [place], [place] sent at 12/30/2019  1:55 PM EDT ----- Regarding: Lab appt Needs lab appt in 10 days (non-fasting).

## 2019-12-30 NOTE — Addendum Note (Signed)
Addended byDamita Dunnings D on: 12/30/2019 01:13 PM   Modules accepted: Orders

## 2019-12-30 NOTE — Telephone Encounter (Signed)
Called left msg to call back for 10 day lab appt

## 2020-01-07 ENCOUNTER — Other Ambulatory Visit: Payer: Self-pay | Admitting: Internal Medicine

## 2020-01-08 ENCOUNTER — Encounter: Payer: Self-pay | Admitting: Internal Medicine

## 2020-01-08 ENCOUNTER — Telehealth (INDEPENDENT_AMBULATORY_CARE_PROVIDER_SITE_OTHER): Payer: BC Managed Care – PPO | Admitting: Internal Medicine

## 2020-01-08 VITALS — Ht 70.0 in | Wt 221.8 lb

## 2020-01-08 DIAGNOSIS — E119 Type 2 diabetes mellitus without complications: Secondary | ICD-10-CM | POA: Diagnosis not present

## 2020-01-08 DIAGNOSIS — Z8249 Family history of ischemic heart disease and other diseases of the circulatory system: Secondary | ICD-10-CM | POA: Diagnosis not present

## 2020-01-08 DIAGNOSIS — I10 Essential (primary) hypertension: Secondary | ICD-10-CM | POA: Diagnosis not present

## 2020-01-08 DIAGNOSIS — E785 Hyperlipidemia, unspecified: Secondary | ICD-10-CM

## 2020-01-08 DIAGNOSIS — E1169 Type 2 diabetes mellitus with other specified complication: Secondary | ICD-10-CM

## 2020-01-08 NOTE — Patient Instructions (Addendum)
Medication Instructions:  Your physician recommends that you continue on your current medications as directed. Please refer to the Current Medication list given to you today.  *If you need a refill on your cardiac medications before your next appointment, please call your pharmacy*   Lab Work: FASTING lipid panel in 6 months Please complete about 1 week before next appointment with Dr. Debara Pickett   If you have labs (blood work) drawn today and your tests are completely normal, you will receive your results only by: Marland Kitchen MyChart Message (if you have MyChart) OR . A paper copy in the mail If you have any lab test that is abnormal or we need to change your treatment, we will call you to review the results.   Testing/Procedures: NONE   Follow-Up: At [facility], you and your health needs are our priority.  As part of our continuing mission to provide you with exceptional heart care, we have created designated Provider Care Teams.  These Care Teams include your primary Cardiologist (physician) and Advanced Practice Providers (APPs -  Physician Assistants and Nurse Practitioners) who all work together to provide you with the care you need, when you need it.  We recommend signing up for the patient portal called "MyChart".  Sign up information is provided on this After Visit Summary.  MyChart is used to connect with patients for Virtual Visits (Telemedicine).  Patients are able to view lab/test results, encounter notes, upcoming appointments, etc.  Non-urgent messages can be sent to your provider as well.   To learn more about what you can do with MyChart, go to [email or web address].    Your next appointment:   6 month(s) - lipid clinic  The format for your next appointment:   Either In Person or Virtual  Provider:   Raliegh Ip Mali Hilty, MD   Other Instructions  Dr. Debara Pickett recommends working on diet and exercise

## 2020-01-08 NOTE — Progress Notes (Signed)
Virtual Visit via Telephone Note   This visit type was conducted due to national recommendations for restrictions regarding the COVID-19 Pandemic (e.g. social distancing) in an effort to limit this patient's exposure and mitigate transmission in our community.  Due to his co-morbid illnesses, this patient is at least at moderate risk for complications without adequate follow up.  This format is felt to be most appropriate for this patient at this time.  The patient did not have access to video technology/had technical difficulties with video requiring transitioning to audio format only (telephone).  All issues noted in this document were discussed and addressed.  No physical exam could be performed with this format.  Please refer to the patient's chart for his  consent to telehealth for [facility].   Evaluation Performed:  Telephone lipid consult  Date:  01/08/2020   ID:  Robert Velazquez, DOB 11-02-1965, MRN [id number]  Patient Location:  [place] [address]  Provider location:   [address] [place], [place] [address]  PCP:  Colon Branch, MD  Cardiologist:  No primary care provider on file. Electrophysiologist:  None   Chief Complaint:  Dyslipidemia  History of Present Illness:    Robert Velazquez is a 54 y.o. male who presents via audio/video conferencing for a telehealth visit today.  Mr. Florencio is seen today for a virtual visit per Dr. Agustin Cree for evaluation and management of dyslipidemia.  He reports a history of relative statin intolerance but currently has been doing well on low-dose rosuvastatin 5 mg daily.  His lipid profile actually is reasonably good, demonstrating total cholesterol 107, triglycerides 273, HDL 27 and a direct LDL of 50.  A year ago his total cholesterol was 107 as well however triglycerides were 142, HDL was a little higher at 33 and LDL was a little lower at 40.  Overall the lipid profile is most suggestive of metabolic syndrome and  typically seen in diabetes which she has.  The notable difference in the past year is that he has had an increase in triglycerides decline in his HDL cholesterol and a slight increase in LDL cholesterol.  This is most likely due to the fact that he decreased his exercise, has altered his diet and is eating more sugars and fats and is less physically active.  He also has reported some elevated liver enzymes in the past however a remote abdominal ultrasound as well as a recent CT renal stone study both did not demonstrate any evidence of hepatic steatosis.  His most recent liver enzymes were normal.  The patient does not have symptoms concerning for COVID-19 infection (fever, chills, cough, or new SHORTNESS OF BREATH).    Prior CV studies:   The following studies were reviewed today:  Chart reviewed  PMHx:  Past Medical History:  Diagnosis Date  . Annual physical exam 11/20/2014  . Arthritis    rheumatoid arthritis variant; Dr Jolee Ewing  . Chronic nasal congestion 09/24/2015  . Diabetes mellitus without complication ([place]) 0000000  . Essential hypertension 08/18/2010   Qualifier: Diagnosis of  By: Linna Darner MD, Gwyndolyn Saxon    . Family history of early CAD 11/20/2014  . GERD (gastroesophageal reflux disease)   . 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 Globus sensation 12/22/2015  . History of chicken pox   . Hyperlipidemia    NMR LDL 140(1854/1324)TG 102,HDL 33  . Hypertension   . Rheumatoid  arthritis ([place]) 02/09/2010   Qualifier: Diagnosis of  By: Inda Castle FNP, [place] Hampshire  Dr Charlestine Night, Rheumatology      Past Surgical History:  Procedure Laterality Date  . EPIDURAL BLOCK INJECTION  2011-2014   ? 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 KNEE SURGERY     arthoscopic x2  . MOUTH SURGERY     wisdom teeth & tooth removed from palate & re-implated  . TONSILLECTOMY      FAMHx:  Family History  Problem Relation Age of Onset  . Heart attack Father 77        MI ,died @ 67 (40rd MI)  . Arthritis Mother        ? type, ? not RA - Living  . Heart attack Paternal Uncle 75       MI  . Arthritis Paternal Grandmother        ??RA  . Heart attack Paternal Grandfather 14       MI  . Lung cancer Maternal Grandfather        smoker  . Heart attack Paternal Uncle   . Healthy Sister   . Asthma Son   . Allergies Son   . Healthy Son        #2  . Diabetes Neg Hx   . Stroke Neg Hx   . Colon cancer Neg Hx   . Colon polyps Neg Hx   . Rectal cancer Neg Hx   . Stomach cancer Neg Hx   . Prostate cancer Neg Hx     SOCHx:   reports that he has never smoked. He has never used smokeless tobacco. He reports that he does not drink alcohol or use drugs.  ALLERGIES:  No Known Allergies  MEDS:  Current Meds  Medication Sig  . acetaminophen (TYLENOL) 325 MG tablet Take 650 mg by mouth as needed. Reported on 11/29/2015  . aspirin EC 81 MG tablet Take 81 mg by mouth daily.  . carvedilol (COREG) 25 MG tablet Take 1 tablet (25 mg total) by mouth 2 (two) times daily with a meal.  . chlorthalidone (HYGROTON) 25 MG tablet Take 1 tablet (25 mg total) by mouth daily.  Marland Kitchen CINNAMON PO Take by mouth.  . cromolyn (NASALCROM) 5.2 MG/ACT nasal spray Place 2 sprays into both nostrils 2 (two) times daily.  . fluticasone (FLONASE) 50 MCG/ACT nasal spray Place 2 sprays into both nostrils daily as needed for allergies or rhinitis.  . folic acid (FOLVITE) A999333 MCG tablet Take 400 mcg by mouth daily.  . hydrochlorothiazide (HYDRODIURIL) 25 MG tablet Take 25 mg by mouth daily.  Marland Kitchen losartan (COZAAR) 100 MG tablet Take 1 tablet (100 mg total) by mouth daily.  . metFORMIN (GLUCOPHAGE) 850 MG tablet Half tablet twice a day for the first week, then 1 tablet twice a day  . methotrexate (RHEUMATREX) 2.5 MG tablet Take 5 tablets by mouth weekly as directed.  . Multiple Vitamin (MULTIVITAMIN) tablet Take 1 tablet by mouth daily.  . pantoprazole (PROTONIX) 40 MG tablet Take 1 tablet (40 mg  total) by mouth 2 (two) times daily before a meal.  . potassium chloride (KLOR-CON) 10 MEQ tablet Take 1 tablet (10 mEq total) by mouth daily.  . rosuvastatin (CRESTOR) 5 MG tablet Take 1 tablet (5 mg total) by mouth daily.  . TURMERIC PO Take by mouth.     ROS: Pertinent items noted in HPI and remainder of comprehensive ROS otherwise negative.  Labs/Other Tests and Data Reviewed:  Recent Labs: 12/16/2019: ALT 32; Hemoglobin 16.1; Platelets 154.0; TSH 1.36 12/30/2019: BUN 14; Creatinine, Ser 1.01; Potassium 3.1; Sodium 139   Recent Lipid Panel Lab Results  Component Value Date/Time   CHOL 107 12/16/2019 08:46 AM   TRIG 273.0 (H) 12/16/2019 08:46 AM   TRIG 109 06/28/2006 08:42 AM   HDL 27.70 (L) 12/16/2019 08:46 AM   CHOLHDL 4 12/16/2019 08:46 AM   LDLCALC 46 12/18/2018 08:06 AM   LDLDIRECT 50.0 12/16/2019 08:46 AM    Wt Readings from Last 3 Encounters:  01/08/20 221 lb 12.8 oz (100.6 kg)  12/26/19 229 lb (103.9 kg)  12/16/19 229 lb 6 oz (104 kg)     Exam:    Vital Signs:  Ht 5\' 10"  (1.778 m)   Wt 221 lb 12.8 oz (100.6 kg)   BMI 31.82 kg/m    Exam not performed due to telephone visit  ASSESSMENT & PLAN:    1. Mixed dyslipidemia, diabetic pattern 2. Uncontrolled type 2 diabetes-hemoglobin A1c 9.6 3. Physical inactivity  Mr. Gilpin actually has a reasonably good lipid profile except for elevated triglycerides but is at target LDL of 50.  He has a low HDL cholesterol which was better a year ago as well as his triglycerides were better, representing better glycemic control.  In fact his A1c a year ago was 6 and now is 9.6, diagnostic of overt diabetes.  I agree with aggressive medical treatment including adding Metformin and possibly considering an additional agent.  This is likely responsible for the increase in his triglycerides and lower HDL.  He needs to work on aggressive dietary modification as well as more physical activity.  I would recommend continuing his low-dose  rosuvastatin for now and repeat lipids in about 6 months.  At that time if there have not been significant changes, he may need additional medication.  Thanks again for the kind referral.  COVID-19 Education: The signs and symptoms of COVID-19 were discussed with the patient and how to seek care for testing (follow up with PCP or arrange E-visit).  The importance of social distancing was discussed today.  Patient Risk:   After full review of this patients clinical status, I feel that they are at least moderate risk at this time.  Time:   Today, I have spent 25 minutes with the patient with telehealth technology discussing edema, dietary approaches to lipid management, dietary approaches to diabetes control.     Medication Adjustments/Labs and Tests Ordered: Current medicines are reviewed at length with the patient today.  Concerns regarding medicines are outlined above.   Tests Ordered: No orders of the defined types were placed in this encounter.   Medication Changes: No orders of the defined types were placed in this encounter.   Disposition:  in 6 month(s)  Pixie Casino, MD, [facility], [place] Director of the Advanced Lipid Disorders &  Cardiovascular Risk Reduction Clinic Diplomate of the American Board of Clinical Lipidology Attending Cardiologist  Direct Dial: [phone]  Fax: [phone]  Website:  [email or web address]  Pixie Casino, MD  01/08/2020 8:46 AM

## 2020-01-13 ENCOUNTER — Other Ambulatory Visit: Payer: Self-pay

## 2020-01-13 ENCOUNTER — Other Ambulatory Visit (INDEPENDENT_AMBULATORY_CARE_PROVIDER_SITE_OTHER): Payer: BC Managed Care – PPO

## 2020-01-13 DIAGNOSIS — I10 Essential (primary) hypertension: Secondary | ICD-10-CM | POA: Diagnosis not present

## 2020-01-13 LAB — BASIC METABOLIC PANEL
BUN: 19 mg/dL (ref 6–23)
CO2: 29 mEq/L (ref 19–32)
Calcium: 9.4 mg/dL (ref 8.4–10.5)
Chloride: 99 mEq/L (ref 96–112)
Creatinine, Ser: 1.06 mg/dL (ref 0.40–1.50)
GFR: 72.36 mL/min (ref >60.00)
Glucose, Bld: 175 mg/dL — ABNORMAL HIGH (ref 70–99)
Potassium: 3.2 mEq/L — ABNORMAL LOW (ref 3.5–5.1)
Sodium: 140 mEq/L (ref 135–145)

## 2020-01-14 MED ORDER — POTASSIUM CHLORIDE CRYS ER 20 MEQ PO TBCR
20.0000 meq | EXTENDED_RELEASE_TABLET | Freq: Every day | ORAL | 0 refills | Status: DC
Start: 2020-01-14 — End: 2020-02-09

## 2020-01-14 NOTE — Addendum Note (Signed)
Addended byDamita Dunnings D on: 01/14/2020 08:59 AM   Modules accepted: Orders

## 2020-01-20 DIAGNOSIS — M0579 Rheumatoid arthritis with rheumatoid factor of multiple sites without organ or systems involvement: Secondary | ICD-10-CM | POA: Diagnosis not present

## 2020-01-20 DIAGNOSIS — Z79899 Other long term (current) drug therapy: Secondary | ICD-10-CM | POA: Diagnosis not present

## 2020-01-21 ENCOUNTER — Other Ambulatory Visit: Payer: Self-pay | Admitting: Internal Medicine

## 2020-02-05 ENCOUNTER — Other Ambulatory Visit (INDEPENDENT_AMBULATORY_CARE_PROVIDER_SITE_OTHER): Payer: BC Managed Care – PPO

## 2020-02-05 ENCOUNTER — Other Ambulatory Visit: Payer: Self-pay

## 2020-02-05 DIAGNOSIS — I10 Essential (primary) hypertension: Secondary | ICD-10-CM | POA: Diagnosis not present

## 2020-02-05 LAB — BASIC METABOLIC PANEL
BUN: 15 mg/dL (ref 6–23)
CO2: 29 mEq/L (ref 19–32)
Calcium: 9.6 mg/dL (ref 8.4–10.5)
Chloride: 102 mEq/L (ref 96–112)
Creatinine, Ser: 0.98 mg/dL (ref 0.40–1.50)
GFR: 79.2 mL/min (ref >60.00)
Glucose, Bld: 179 mg/dL — ABNORMAL HIGH (ref 70–99)
Potassium: 3.6 mEq/L (ref 3.5–5.1)
Sodium: 139 mEq/L (ref 135–145)

## 2020-02-08 ENCOUNTER — Other Ambulatory Visit: Payer: Self-pay | Admitting: Internal Medicine

## 2020-02-19 ENCOUNTER — Encounter: Payer: Self-pay | Admitting: Internal Medicine

## 2020-02-24 ENCOUNTER — Encounter: Payer: Self-pay | Admitting: Internal Medicine

## 2020-03-17 ENCOUNTER — Encounter: Payer: Self-pay | Admitting: Internal Medicine

## 2020-03-17 ENCOUNTER — Other Ambulatory Visit: Payer: Self-pay

## 2020-03-17 ENCOUNTER — Ambulatory Visit (INDEPENDENT_AMBULATORY_CARE_PROVIDER_SITE_OTHER): Payer: BC Managed Care – PPO | Admitting: Internal Medicine

## 2020-03-17 VITALS — BP 118/80 | HR 62 | Temp 98.2°F | Resp 16 | Ht 70.0 in | Wt 225.2 lb

## 2020-03-17 DIAGNOSIS — I10 Essential (primary) hypertension: Secondary | ICD-10-CM

## 2020-03-17 DIAGNOSIS — E119 Type 2 diabetes mellitus without complications: Secondary | ICD-10-CM

## 2020-03-17 LAB — BASIC METABOLIC PANEL
BUN: 18 mg/dL (ref 6–23)
CO2: 26 mEq/L (ref 19–32)
Calcium: 9.8 mg/dL (ref 8.4–10.5)
Chloride: 102 mEq/L (ref 96–112)
Creatinine, Ser: 1.07 mg/dL (ref 0.40–1.50)
GFR: 71.53 mL/min (ref >60.00)
Glucose, Bld: 174 mg/dL — ABNORMAL HIGH (ref 70–99)
Potassium: 3.6 mEq/L (ref 3.5–5.1)
Sodium: 139 mEq/L (ref 135–145)

## 2020-03-17 LAB — HEMOGLOBIN A1C: Hgb A1c MFr Bld: 7 % — ABNORMAL HIGH (ref 4.6–6.5)

## 2020-03-17 NOTE — Progress Notes (Signed)
Subjective:    Patient ID: Robert Velazquez, male    DOB: [birth year], 54 y.o.   MRN: [id number]  DOS:  03/17/2020 Type of visit - description: Follow-up Today with talk about diabetes, hypertension. He is doing better with lifestyle. Saw the lipid clinic.  Note reviewed   Wt Readings from Last 3 Encounters:  03/17/20 225 lb 4 oz (102.2 kg)  01/08/20 221 lb 12.8 oz (100.6 kg)  12/26/19 229 lb (103.9 kg)   BP Readings from Last 3 Encounters:  03/17/20 118/80  12/26/19 132/88  12/16/19 129/90     Review of Systems See above   Past Medical History:  Diagnosis Date  . Annual physical exam 11/20/2014  . Arthritis    rheumatoid arthritis variant; Dr Jolee Ewing  . Chronic nasal congestion 09/24/2015  . Diabetes mellitus without complication ([place]) 2/67/1245  . Essential hypertension 08/18/2010   Qualifier: Diagnosis of  By: Linna Darner MD, Gwyndolyn Saxon    . Family history of early CAD 11/20/2014  . GERD (gastroesophageal reflux disease)   . 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 Globus sensation 12/22/2015  . History of chicken pox   . Hyperlipidemia    NMR LDL 140(1854/1324)TG 102,HDL 33  . Hypertension   . Rheumatoid arthritis ([place]) 02/09/2010   Qualifier: Diagnosis of  By: Inda Castle FNP, [place]  Dr Charlestine Night, Rheumatology      Past Surgical History:  Procedure Laterality Date  . EPIDURAL BLOCK INJECTION  2011-2014   ? 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 KNEE SURGERY     arthoscopic x2  . MOUTH SURGERY     wisdom teeth & tooth removed from palate & re-implated  . TONSILLECTOMY      Allergies as of 03/17/2020   No Known Allergies     Medication List       Accurate as of March 17, 2020 11:59 PM. If you have any questions, ask your nurse or doctor.        acetaminophen 325 MG tablet Commonly known as: TYLENOL Take 650 mg by mouth as needed. Reported on 11/29/2015   aspirin EC 81 MG tablet Take 81 mg by mouth daily.     carvedilol 25 MG tablet Commonly known as: COREG Take 1 tablet (25 mg total) by mouth 2 (two) times daily with a meal.   chlorthalidone 25 MG tablet Commonly known as: HYGROTON Take 1 tablet (25 mg total) by mouth daily.   CINNAMON PO Take by mouth.   cromolyn 5.2 MG/ACT nasal spray Commonly known as: NASALCROM Place 2 sprays into both nostrils 2 (two) times daily.   fluticasone 50 MCG/ACT nasal spray Commonly known as: FLONASE Place 2 sprays into both nostrils daily as needed for allergies or rhinitis.   folic acid 809 MCG tablet Commonly known as: FOLVITE Take 400 mcg by mouth daily.   hydrochlorothiazide 25 MG tablet Commonly known as: HYDRODIURIL Take 25 mg by mouth daily.   losartan 100 MG tablet Commonly known as: COZAAR Take 1 tablet (100 mg total) by mouth daily.   metFORMIN 850 MG tablet Commonly known as: GLUCOPHAGE Take 1 tablet (850 mg total) by mouth 2 (two) times daily with a meal. What changed:   how much to take  how to take this  when to take this  additional instructions Changed by: [name], MD   methotrexate 2.5 MG tablet Commonly known as: RHEUMATREX Take 5 tablets by mouth  weekly as directed.   multivitamin tablet Take 1 tablet by mouth daily.   pantoprazole 40 MG tablet Commonly known as: PROTONIX Take 1 tablet (40 mg total) by mouth 2 (two) times daily before a meal.   potassium chloride SA 20 MEQ tablet Commonly known as: [place]-Con M20 Take 1 tablet (20 mEq total) by mouth daily.   rosuvastatin 5 MG tablet Commonly known as: CRESTOR Take 1 tablet (5 mg total) by mouth daily.   TURMERIC PO Take by mouth.          Objective:   Physical Exam BP 118/80 (BP Location: Left Arm, Patient Position: Sitting, Cuff Size: Normal)   Pulse 62   Temp 98.2 F (36.8 C) (Oral)   Resp 16   Ht 5\' 10"  (1.778 m)   Wt 225 lb 4 oz (102.2 kg)   SpO2 96%   BMI 32.32 kg/m  General:   Well developed, NAD, BMI noted. HEENT:   Normocephalic . Face symmetric, atraumatic Lungs:  CTA B Normal respiratory effort, no intercostal retractions, no accessory muscle use. Heart: RRR,  no murmur.  Lower extremities: no pretibial edema bilaterally  Skin: Not pale. Not jaundice Neurologic:  alert & oriented X3.  Speech normal, gait appropriate for age and unassisted Psych--  Cognition and judgment appear intact.  Cooperative with normal attention span and concentration.  Behavior appropriate. No anxious or depressed appearing.      Assessment     Assessment (transfer to me 11-2016) Diabetes (prediabetic since 2017)  HTN (amlodipine= edema noted 2019) Hyperlipidemia GERD R.A. Dx ~ mid 68s + FH CAD  PLAN: DM: Last A1c was 9.6, started Metformin, good compliance, no apparent side effects, diet improved, has lost 10 pounds per his own scales.  Unable to exercise much yet.  No ambulatory CBGs, recommend to consider start doing so.  Recheck A1c.Consider increase Metformin dose versus add a second agent.  Patient aware. HTN: Since the last visit, BP was elevated, HCTZ stopped and started chlorthalidone, also taking potassium, carvedilol, losartan. No recent ambulatory BPs, BP today is very good, check a BMP. Dyslipidemia: Seen at the cardiology office, increase triglyceride and lower HDL likely due to DM.  They recommended to continue Crestor RTC 4 months    This visit occurred during the SARS-CoV-2 public health emergency.  Safety protocols were in place, including screening questions prior to the visit, additional usage of staff PPE, and extensive cleaning of exam room while observing appropriate contact time as indicated for disinfecting solutions.

## 2020-03-17 NOTE — Patient Instructions (Addendum)
Per our records you are due for an eye exam. Please contact your eye doctor to schedule an appointment. Please have them send copies of your office visit notes to [place]. Our fax number is [phone].   Diabetes: Consider check your blood sugars once daily. GOALS: Fasting before a meal 70- 130 2 hours after a meal less than 180  GO TO THE LAB : Get the blood work     [place], [place] Come back for a checkup in 4 months depending on results

## 2020-03-17 NOTE — Progress Notes (Signed)
Pre visit review using our clinic review tool, if applicable. No additional management support is needed unless otherwise documented below in the visit note. 

## 2020-03-18 NOTE — Assessment & Plan Note (Signed)
DM: Last A1c was 9.6, started Metformin, good compliance, no apparent side effects, diet improved, has lost 10 pounds per his own scales.  Unable to exercise much yet.  No ambulatory CBGs, recommend to consider start doing so.  Recheck A1c.Consider increase Metformin dose versus add a second agent.  Patient aware. HTN: Since the last visit, BP was elevated, HCTZ stopped and started chlorthalidone, also taking potassium, carvedilol, losartan. No recent ambulatory BPs, BP today is very good, check a BMP. Dyslipidemia: Seen at the cardiology office, increase triglyceride and lower HDL likely due to DM.  They recommended to continue Crestor RTC 4 months

## 2020-03-19 ENCOUNTER — Ambulatory Visit (INDEPENDENT_AMBULATORY_CARE_PROVIDER_SITE_OTHER): Payer: BC Managed Care – PPO | Admitting: Cardiology

## 2020-03-19 ENCOUNTER — Encounter: Payer: Self-pay | Admitting: Cardiology

## 2020-03-19 ENCOUNTER — Other Ambulatory Visit: Payer: Self-pay

## 2020-03-19 VITALS — BP 112/62 | HR 78 | Ht 70.0 in | Wt 225.0 lb

## 2020-03-19 DIAGNOSIS — I10 Essential (primary) hypertension: Secondary | ICD-10-CM | POA: Diagnosis not present

## 2020-03-19 DIAGNOSIS — M069 Rheumatoid arthritis, unspecified: Secondary | ICD-10-CM | POA: Diagnosis not present

## 2020-03-19 DIAGNOSIS — E782 Mixed hyperlipidemia: Secondary | ICD-10-CM | POA: Diagnosis not present

## 2020-03-19 DIAGNOSIS — E119 Type 2 diabetes mellitus without complications: Secondary | ICD-10-CM

## 2020-03-19 DIAGNOSIS — Z8249 Family history of ischemic heart disease and other diseases of the circulatory system: Secondary | ICD-10-CM

## 2020-03-19 MED ORDER — METFORMIN HCL 1000 MG PO TABS
1000.0000 mg | ORAL_TABLET | Freq: Two times a day (BID) | ORAL | 1 refills | Status: DC
Start: 2020-03-19 — End: 2020-09-13

## 2020-03-19 NOTE — Patient Instructions (Addendum)
Medication Instructions:  Your physician recommends that you continue on your current medications as directed. Please refer to the Current Medication list given to you today.  *If you need a refill on your cardiac medications before your next appointment, please call your pharmacy*   Lab Work: None ordered  If you have labs (blood work) drawn today and your tests are completely normal, you will receive your results only by: . MyChart Message (if you have MyChart) OR . A paper copy in the mail If you have any lab test that is abnormal or we need to change your treatment, we will call you to review the results.   Testing/Procedures: None ordered   Follow-Up: At [facility], you and your health needs are our priority.  As part of our continuing mission to provide you with exceptional heart care, we have created designated Provider Care Teams.  These Care Teams include your primary Cardiologist (physician) and Advanced Practice Providers (APPs -  Physician Assistants and Nurse Practitioners) who all work together to provide you with the care you need, when you need it.  We recommend signing up for the patient portal called "MyChart".  Sign up information is provided on this After Visit Summary.  MyChart is used to connect with patients for Virtual Visits (Telemedicine).  Patients are able to view lab/test results, encounter notes, upcoming appointments, etc.  Non-urgent messages can be sent to your provider as well.   To learn more about what you can do with MyChart, go to [email or web address].    Your next appointment:   5 month(s)  The format for your next appointment:   In Person  Provider:   Robert Krasowski, MD   Other Instructions   

## 2020-03-19 NOTE — Addendum Note (Signed)
Addended byDamita Dunnings D on: 03/19/2020 07:38 AM   Modules accepted: Orders

## 2020-03-19 NOTE — Progress Notes (Signed)
Cardiology Office Note:    Date:  03/19/2020   ID:  Robert Velazquez, DOB September 21, 1965, MRN [id number]  PCP:  Colon Branch, MD  Cardiologist:  Jenne Campus, MD    Referring MD: Colon Branch, MD   No chief complaint on file. I am doing fine  History of Present Illness:    Robert Velazquez is a 54 y.o. male with multiple risk factors for coronary artery disease but the biggest concern is the fact that his family multiple members had early/premature coronary artery disease.  Overall he is doing well.  Denies have any chest pain tightness squeezing pressure burning chest.  The main reason for visit is very structures modifications.  Past Medical History:  Diagnosis Date  . Annual physical exam 11/20/2014  . Arthritis    rheumatoid arthritis variant; Dr Jolee Ewing  . Chronic nasal congestion 09/24/2015  . Diabetes mellitus without complication ([place]) 03/23/1750  . Essential hypertension 08/18/2010   Qualifier: Diagnosis of  By: Linna Darner MD, Gwyndolyn Saxon    . Family history of early CAD 11/20/2014  . GERD (gastroesophageal reflux disease)   . 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 Globus sensation 12/22/2015  . History of chicken pox   . Hyperlipidemia    NMR LDL 140(1854/1324)TG 102,HDL 33  . Hypertension   . Rheumatoid arthritis ([place]) 02/09/2010   Qualifier: Diagnosis of  By: Inda Castle FNP, [place] Hampshire  Dr Charlestine Night, Rheumatology      Past Surgical History:  Procedure Laterality Date  . EPIDURAL BLOCK INJECTION  2011-2014   ? 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 KNEE SURGERY     arthoscopic x2  . MOUTH SURGERY     wisdom teeth & tooth removed from palate & re-implated  . TONSILLECTOMY      Current Medications: Current Meds  Medication Sig  . acetaminophen (TYLENOL) 325 MG tablet Take 650 mg by mouth as needed. Reported on 11/29/2015  . aspirin EC 81 MG tablet Take 81 mg by mouth daily.  . carvedilol (COREG) 25 MG tablet Take 1 tablet (25 mg total) by  mouth 2 (two) times daily with a meal.  . chlorthalidone (HYGROTON) 25 MG tablet Take 1 tablet (25 mg total) by mouth daily.  Marland Kitchen CINNAMON PO Take by mouth.  . cromolyn (NASALCROM) 5.2 MG/ACT nasal spray Place 2 sprays into both nostrils 2 (two) times daily.  . fluticasone (FLONASE) 50 MCG/ACT nasal spray Place 2 sprays into both nostrils daily as needed for allergies or rhinitis.  . folic acid (FOLVITE) 025 MCG tablet Take 400 mcg by mouth daily.  . hydrochlorothiazide (HYDRODIURIL) 25 MG tablet Take 25 mg by mouth daily.  Marland Kitchen losartan (COZAAR) 100 MG tablet Take 1 tablet (100 mg total) by mouth daily.  . metFORMIN (GLUCOPHAGE) 1000 MG tablet Take 1 tablet (1,000 mg total) by mouth 2 (two) times daily with a meal.  . methotrexate (RHEUMATREX) 2.5 MG tablet Take 5 tablets by mouth weekly as directed.  . Multiple Vitamin (MULTIVITAMIN) tablet Take 1 tablet by mouth daily.  . pantoprazole (PROTONIX) 40 MG tablet Take 1 tablet (40 mg total) by mouth 2 (two) times daily before a meal.  . potassium chloride SA (KLOR-CON M20) 20 MEQ tablet Take 1 tablet (20 mEq total) by mouth daily.  . rosuvastatin (CRESTOR) 5 MG tablet Take 1 tablet (5 mg total) by mouth daily.  . TURMERIC PO Take by mouth.  Allergies:   Patient has no known allergies.   Social History   Socioeconomic History  . Marital status: Married    Spouse name: Not on file  . Number of children: 2  . Years of education: Not on file  . Highest education level: Not on file  Occupational History  . Occupation: MARKETING AND Scientist, clinical (histocompatibility and immunogenetics): [employer]  Tobacco Use  . Smoking status: Never Smoker  . Smokeless tobacco: Never Used  Vaping Use  . Vaping Use: Never used  Substance and Sexual Activity  . Alcohol use: No  . Drug use: No  . Sexual activity: Not on file  Other Topics Concern  . Not on file  Social History Narrative   Household- pt, wife, 1 children still at home, other child @ [place]    Social  Determinants of Health   Financial Resource Strain:   . Difficulty of Paying Living Expenses:   Food Insecurity:   . Worried About Charity fundraiser in the Last Year:   . Arboriculturist in the Last Year:   Transportation Needs:   . Film/video editor (Medical):   Marland Kitchen Lack of Transportation (Non-Medical):   Physical Activity:   . Days of Exercise per Week:   . Minutes of Exercise per Session:   Stress:   . Feeling of Stress :   Social Connections:   . Frequency of Communication with Friends and Family:   . Frequency of Social Gatherings with Friends and Family:   . Attends Religious Services:   . Active Member of Clubs or Organizations:   . Attends Archivist Meetings:   Marland Kitchen Marital Status:      Family History: The patient's family history includes Allergies in his son; Arthritis in his mother and paternal grandmother; Asthma in his son; Healthy in his sister and son; Heart attack in his paternal uncle; Heart attack (age of onset: 30) in his father; Heart attack (age of onset: 53) in his paternal grandfather and paternal uncle; Lung cancer in his maternal grandfather. There is no history of Diabetes, Stroke, Colon cancer, Colon polyps, Rectal cancer, Stomach cancer, or Prostate cancer. ROS:   Please see the history of present illness.    All 14 point review of systems negative except as described per history of present illness  EKGs/Labs/Other Studies Reviewed:      Recent Labs: 12/16/2019: ALT 32; Hemoglobin 16.1; Platelets 154.0; TSH 1.36 03/17/2020: BUN 18; Creatinine, Ser 1.07; Potassium 3.6; Sodium 139  Recent Lipid Panel    Component Value Date/Time   CHOL 107 12/16/2019 0846   TRIG 273.0 (H) 12/16/2019 0846   TRIG 109 06/28/2006 0842   HDL 27.70 (L) 12/16/2019 0846   CHOLHDL 4 12/16/2019 0846   VLDL 54.6 (H) 12/16/2019 0846   LDLCALC 46 12/18/2018 0806   LDLDIRECT 50.0 12/16/2019 0846    Physical Exam:    VS:  BP 112/62 (BP Location: Right Arm,  Patient Position: Sitting, Cuff Size: Normal)   Pulse 78   Ht 5\' 10"  (1.778 m)   Wt 225 lb (102.1 kg)   SpO2 95%   BMI 32.28 kg/m     Wt Readings from Last 3 Encounters:  03/19/20 225 lb (102.1 kg)  03/17/20 225 lb 4 oz (102.2 kg)  01/08/20 221 lb 12.8 oz (100.6 kg)     GEN:  Well nourished, well developed in no acute distress HEENT: Normal NECK: No JVD; No carotid bruits LYMPHATICS:  No lymphadenopathy CARDIAC: RRR, no murmurs, no rubs, no gallops RESPIRATORY:  Clear to auscultation without rales, wheezing or rhonchi  ABDOMEN: Soft, non-tender, non-distended MUSCULOSKELETAL:  No edema; No deformity  SKIN: Warm and dry LOWER EXTREMITIES: no swelling NEUROLOGIC:  Alert and oriented x 3 PSYCHIATRIC:  Normal affect   ASSESSMENT:    1. Essential hypertension   2. Diabetes mellitus without complication ([place])   3. Rheumatoid arthritis involving multiple sites, unspecified whether rheumatoid factor present ([place])   4. Mixed hyperlipidemia   5. Family history of early CAD    PLAN:    In order of problems listed above:  1. Essential hypertension blood pressure well controlled continue present management. 2. Diabetes mellitus his hemoglobin A1c improved significantly.  Still some room to go.  We talked about need to exercise on the regular basis and good diet and he is trying to do it he lost over 10 pounds. 3. Rheumatoid arthritis improving.  He is only on methotrexate and he is thinking about potentially stopping it for a while with permission of his rheumatologist. 4. Mixed dyslipidemia: He did see our lipid clinic specialist.  But for now we will continue present management will call for diabetes will be better which improve his HDL as well as triglycerides. 5. Family history of premature coronary artery disease.  Noted.   Medication Adjustments/Labs and Tests Ordered: Current medicines are reviewed at length with the patient today.  Concerns regarding medicines are outlined  above.  No orders of the defined types were placed in this encounter.  Medication changes: No orders of the defined types were placed in this encounter.   Signed, Park Liter, MD, [facility] 03/19/2020 8:59 AM    [place]

## 2020-03-21 ENCOUNTER — Other Ambulatory Visit: Payer: Self-pay | Admitting: Internal Medicine

## 2020-03-27 ENCOUNTER — Encounter: Payer: Self-pay | Admitting: Internal Medicine

## 2020-03-30 ENCOUNTER — Other Ambulatory Visit: Payer: Self-pay | Admitting: Internal Medicine

## 2020-04-20 ENCOUNTER — Ambulatory Visit: Payer: BC Managed Care – PPO | Admitting: Internal Medicine

## 2020-04-20 DIAGNOSIS — Z79899 Other long term (current) drug therapy: Secondary | ICD-10-CM | POA: Diagnosis not present

## 2020-04-20 DIAGNOSIS — M0579 Rheumatoid arthritis with rheumatoid factor of multiple sites without organ or systems involvement: Secondary | ICD-10-CM | POA: Diagnosis not present

## 2020-06-12 ENCOUNTER — Other Ambulatory Visit: Payer: Self-pay | Admitting: Internal Medicine

## 2020-06-16 ENCOUNTER — Other Ambulatory Visit: Payer: Self-pay

## 2020-06-16 ENCOUNTER — Ambulatory Visit (INDEPENDENT_AMBULATORY_CARE_PROVIDER_SITE_OTHER): Payer: BC Managed Care – PPO | Admitting: Internal Medicine

## 2020-06-16 ENCOUNTER — Encounter: Payer: Self-pay | Admitting: Internal Medicine

## 2020-06-16 VITALS — BP 106/76 | HR 68 | Ht 70.0 in | Wt 207.0 lb

## 2020-06-16 DIAGNOSIS — E785 Hyperlipidemia, unspecified: Secondary | ICD-10-CM | POA: Diagnosis not present

## 2020-06-16 DIAGNOSIS — Z8249 Family history of ischemic heart disease and other diseases of the circulatory system: Secondary | ICD-10-CM

## 2020-06-16 DIAGNOSIS — I10 Essential (primary) hypertension: Secondary | ICD-10-CM | POA: Diagnosis not present

## 2020-06-16 DIAGNOSIS — E1169 Type 2 diabetes mellitus with other specified complication: Secondary | ICD-10-CM

## 2020-06-16 LAB — LIPID PANEL
Chol/HDL Ratio: 3.1 ratio (ref 0.0–5.0)
Cholesterol, Total: 102 mg/dL (ref 100–199)
HDL: 33 mg/dL — ABNORMAL LOW (ref >39)
LDL Chol Calc (NIH): 50 mg/dL (ref 0–99)
Triglycerides: 97 mg/dL (ref 0–149)
VLDL Cholesterol Cal: 19 mg/dL (ref 5–40)

## 2020-06-16 NOTE — Progress Notes (Signed)
LIPID CLINIC CONSULT NOTE  Chief Complaint:  Follow-up dyslipidemia  Primary Care Physician: Colon Branch, MD  Primary Cardiologist:  No primary care provider on file.  HPI:  Robert Velazquez is a 54 y.o. male who is being seen today for the evaluation of dyslipidemia at the request of Colon Branch, MD. Robert Velazquez is seen today for a virtual visit per Dr. Agustin Cree for evaluation and management of dyslipidemia.  He reports a history of relative statin intolerance but currently has been doing well on low-dose rosuvastatin 5 mg daily.  His lipid profile actually is reasonably good, demonstrating total cholesterol 107, triglycerides 273, HDL 27 and a direct LDL of 50.  A year ago his total cholesterol was 107 as well however triglycerides were 142, HDL was a little higher at 33 and LDL was a little lower at 40.  Overall the lipid profile is most suggestive of metabolic syndrome and typically seen in diabetes which she has.  The notable difference in the past year is that he has had an increase in triglycerides decline in his HDL cholesterol and a slight increase in LDL cholesterol.  This is most likely due to the fact that he decreased his exercise, has altered his diet and is eating more sugars and fats and is less physically active.  He also has reported some elevated liver enzymes in the past however a remote abdominal ultrasound as well as a recent CT renal stone study both did not demonstrate any evidence of hepatic steatosis.  His most recent liver enzymes were normal.  06/16/2020  Robert Velazquez returns today for follow-up. Unfortunately has not had repeat lipids drawn, despite a reminder through my chart on September 27th. He has had some improvement however in his A1c, reduced from 9.6-7.0. He says he is also lost about 10 to 15 pounds and weight today is 207 with a BMI of 29. I suspect he has had some improvement in his lipids as well with this. He is still on rosuvastatin 5 mg daily.  PMHx:  Past  Medical History:  Diagnosis Date  . Annual physical exam 11/20/2014  . Arthritis    rheumatoid arthritis variant; Dr Jolee Ewing  . Chronic nasal congestion 09/24/2015  . Diabetes mellitus without complication ([place]) 9/52/8413  . Essential hypertension 08/18/2010   Qualifier: Diagnosis of  By: Linna Darner MD, Gwyndolyn Saxon    . Family history of early CAD 11/20/2014  . GERD (gastroesophageal reflux disease)   . 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 Globus sensation 12/22/2015  . History of chicken pox   . Hyperlipidemia    NMR LDL 140(1854/1324)TG 102,HDL 33  . Hypertension   . Rheumatoid arthritis ([place]) 02/09/2010   Qualifier: Diagnosis of  By: Inda Castle FNP, [place]  Dr Charlestine Night, Rheumatology      Past Surgical History:  Procedure Laterality Date  . EPIDURAL BLOCK INJECTION  2011-2014   ? 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 KNEE SURGERY     arthoscopic x2  . MOUTH SURGERY     wisdom teeth & tooth removed from palate & re-implated  . TONSILLECTOMY      FAMHx:  Family History  Problem Relation Age of Onset  . Heart attack Father 89       MI ,died @ 18 (73rd MI)  . Arthritis Mother        ? type, ? not RA - Living  .  Heart attack Paternal Uncle 44       MI  . Arthritis Paternal Grandmother        ??RA  . Heart attack Paternal Grandfather 64       MI  . Lung cancer Maternal Grandfather        smoker  . Heart attack Paternal Uncle   . Healthy Sister   . Asthma Son   . Allergies Son   . Healthy Son        #2  . Diabetes Neg Hx   . Stroke Neg Hx   . Colon cancer Neg Hx   . Colon polyps Neg Hx   . Rectal cancer Neg Hx   . Stomach cancer Neg Hx   . Prostate cancer Neg Hx     SOCHx:   reports that he has never smoked. He has never used smokeless tobacco. He reports that he does not drink alcohol and does not use drugs.  ALLERGIES:  No Known Allergies  ROS: Pertinent items noted in HPI and remainder of comprehensive ROS  otherwise negative.  HOME MEDS: Current Outpatient Medications on File Prior to Visit  Medication Sig Dispense Refill  . acetaminophen (TYLENOL) 325 MG tablet Take 650 mg by mouth as needed. Reported on 11/29/2015    . aspirin EC 81 MG tablet Take 81 mg by mouth daily.    . carvedilol (COREG) 25 MG tablet Take 1 tablet (25 mg total) by mouth 2 (two) times daily with a meal. 60 tablet 6  . chlorthalidone (HYGROTON) 25 MG tablet Take 1 tablet (25 mg total) by mouth daily. 90 tablet 1  . CINNAMON PO Take by mouth.    . cromolyn (NASALCROM) 5.2 MG/ACT nasal spray Place 2 sprays into both nostrils 2 (two) times daily. 26 mL 3  . fluticasone (FLONASE) 50 MCG/ACT nasal spray Place 2 sprays into both nostrils daily as needed for allergies or rhinitis. 16 g 12  . folic acid (FOLVITE) 196 MCG tablet Take 400 mcg by mouth daily.    Marland Kitchen losartan (COZAAR) 100 MG tablet TAKE ONE TABLET BY MOUTH DAILY 90 tablet 1  . metFORMIN (GLUCOPHAGE) 1000 MG tablet Take 1 tablet (1,000 mg total) by mouth 2 (two) times daily with a meal. 180 tablet 1  . methotrexate (RHEUMATREX) 2.5 MG tablet Take 5 tablets by mouth weekly as directed.    . Multiple Vitamin (MULTIVITAMIN) tablet Take 1 tablet by mouth daily.    . pantoprazole (PROTONIX) 40 MG tablet Take 1 tablet (40 mg total) by mouth 2 (two) times daily before a meal. 60 tablet 3  . potassium chloride SA (KLOR-CON M20) 20 MEQ tablet Take 1 tablet (20 mEq total) by mouth daily. 90 tablet 1  . rosuvastatin (CRESTOR) 5 MG tablet TAKE ONE TABLET BY MOUTH DAILY 90 tablet 1  . TURMERIC PO Take by mouth.     No current facility-administered medications on file prior to visit.    LABS/IMAGING: No results found for this or any previous visit (from the past 48 hour(s)). No results found.  LIPID PANEL:    Component Value Date/Time   CHOL 107 12/16/2019 0846   TRIG 273.0 (H) 12/16/2019 0846   TRIG 109 06/28/2006 0842   HDL 27.70 (L) 12/16/2019 0846   CHOLHDL 4 12/16/2019  0846   VLDL 54.6 (H) 12/16/2019 0846   LDLCALC 46 12/18/2018 0806   LDLDIRECT 50.0 12/16/2019 0846    WEIGHTS: Wt Readings from Last 3 Encounters:  06/16/20 207  lb (93.9 kg)  03/19/20 225 lb (102.1 kg)  03/17/20 225 lb 4 oz (102.2 kg)    VITALS: BP 106/76   Pulse 68   Ht 5\' 10"  (1.778 m)   Wt 207 lb (93.9 kg)   SpO2 98%   BMI 29.70 kg/m   EXAM: Deferred  EKG: Deferred  ASSESSMENT: 1. Mixed dyslipidemia, diabetic pattern 2. Uncontrolled type 2 diabetes-hemoglobin A1c 9.6 -> 7.0 3. Physical inactivity  PLAN: 1.   Mr. Luca has had further improvement in his A1c which is now 7. I suspect his triglycerides are also improved. He is fasting today so we will repeat his lipids. Adjust medications accordingly.  Pixie Casino, MD, [facility], [place] Director of the Advanced Lipid Disorders &  Cardiovascular Risk Reduction Clinic Diplomate of the American Board of Clinical Lipidology Attending Cardiologist  Direct Dial: [phone]  Fax: [phone]  Website:  [email or web address] 06/16/2020, 1:36 PM

## 2020-06-16 NOTE — Patient Instructions (Signed)
Medication Instructions:  Your physician recommends that you continue on your current medications as directed. Please refer to the Current Medication list given to you today.  *If you need a refill on your cardiac medications before your next appointment, please call your pharmacy*   Lab Work: LIPID PANEL today  If you have labs (blood work) drawn today and your tests are completely normal, you will receive your results only by: Marland Kitchen MyChart Message (if you have MyChart) OR . A paper copy in the mail If you have any lab test that is abnormal or we need to change your treatment, we will call you to review the results.   Testing/Procedures: NONE   Follow-Up: At [facility], you and your health needs are our priority.  As part of our continuing mission to provide you with exceptional heart care, we have created designated Provider Care Teams.  These Care Teams include your primary Cardiologist (physician) and Advanced Practice Providers (APPs -  Physician Assistants and Nurse Practitioners) who all work together to provide you with the care you need, when you need it.  We recommend signing up for the patient portal called "MyChart".  Sign up information is provided on this After Visit Summary.  MyChart is used to connect with patients for Virtual Visits (Telemedicine).  Patients are able to view lab/test results, encounter notes, upcoming appointments, etc.  Non-urgent messages can be sent to your provider as well.   To learn more about what you can do with MyChart, go to [email or web address].    Your next appointment:   Pending lab results

## 2020-06-29 ENCOUNTER — Encounter: Payer: Self-pay | Admitting: Family Medicine

## 2020-06-29 ENCOUNTER — Other Ambulatory Visit: Payer: Self-pay

## 2020-06-29 ENCOUNTER — Ambulatory Visit (INDEPENDENT_AMBULATORY_CARE_PROVIDER_SITE_OTHER): Payer: BC Managed Care – PPO | Admitting: Family Medicine

## 2020-06-29 ENCOUNTER — Ambulatory Visit: Payer: BC Managed Care – PPO | Attending: Internal Medicine

## 2020-06-29 ENCOUNTER — Other Ambulatory Visit (HOSPITAL_BASED_OUTPATIENT_CLINIC_OR_DEPARTMENT_OTHER): Payer: Self-pay | Admitting: Internal Medicine

## 2020-06-29 VITALS — BP 108/68 | HR 66 | Temp 97.9°F | Ht 70.0 in | Wt 202.1 lb

## 2020-06-29 DIAGNOSIS — R1013 Epigastric pain: Secondary | ICD-10-CM

## 2020-06-29 DIAGNOSIS — Z23 Encounter for immunization: Secondary | ICD-10-CM

## 2020-06-29 MED FILL — MODERNA COVID-19 VACCINE 10: 100 | 1 days supply | Qty: 0 | Fill #0

## 2020-06-29 NOTE — Patient Instructions (Signed)
Take the Protonix (pantoprazole) twice daily for the next 2 weeks.  Take Bean-O for the next week.   Mind the diet.   Try to drink 55-60 oz of water daily outside of exercise.  Consider Metamucil.   Let us know if you need anything.

## 2020-06-29 NOTE — Progress Notes (Signed)
Chief Complaint  Patient presents with  . Abdominal Pain    Robert Velazquez is here for epigastric abdominal pain.  Duration: 3 days Nighttime awakenings? No Bleeding? No Weight loss? No Palliation: None Provocation: None Associated symptoms: decreased PO intake, bloating Denies: fever, nausea, vomiting and diarrhea Treatment to date: none  Past Medical History:  Diagnosis Date  . Annual physical exam 11/20/2014  . Arthritis    rheumatoid arthritis variant; Dr Jolee Ewing  . Chronic nasal congestion 09/24/2015  . Diabetes mellitus without complication ([place]) 6/95/0722  . Essential hypertension 08/18/2010   Qualifier: Diagnosis of  By: Linna Darner MD, Gwyndolyn Saxon    . Family history of early CAD 11/20/2014  . GERD (gastroesophageal reflux disease)   . 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 Globus sensation 12/22/2015  . History of chicken pox   . Hyperlipidemia    NMR LDL 140(1854/1324)TG 102,HDL 33  . Hypertension   . Rheumatoid arthritis ([place]) 02/09/2010   Qualifier: Diagnosis of  By: Inda Castle FNP, [place]  Dr Charlestine Night, Rheumatology      BP 108/68 (BP Location: Left Arm, Patient Position: Sitting, Cuff Size: Normal)   Pulse 66   Temp 97.9 F (36.6 C) (Oral)   Ht 5\' 10"  (1.778 m)   Wt 202 lb 2 oz (91.7 kg)   SpO2 99%   BMI 29.00 kg/m  Gen.: Awake, alert, appears stated age 54: Mucous membranes moist without mucosal lesions Heart: Regular rate and rhythm without murmurs Lungs: Clear auscultation bilaterally, no rales or wheezing, normal effort without accessory muscle use. Abdomen: Bowel sounds are present. Abdomen is soft, ttp in epigastric regoin, nondistended, no masses or organomegaly. Negative Murphy's, Rovsing's, McBurney's, and Carnett's sign. Psych: Age appropriate judgment and insight. Normal mood and affect.  Epigastric abdominal pain  Needs to have normal bowel movements again. Hydration encouraged. BID Protonix for 2  weeks. Add Bean-O.  F/u prn, send message in 2 weeks if no better, would consider GI referral.  Pt voiced understanding and agreement to the plan.  [place], DO 06/29/20 10:54 AM

## 2020-06-29 NOTE — Progress Notes (Signed)
° °  Covid-19 Vaccination Clinic  Name:  Robert Velazquez    MRN: [id number] DOB: November 25, 1965  06/29/2020  [name] was observed post Covid-19 immunization for 15 minutes without incident. He was provided with Vaccine Information Sheet and instruction to access the V-Safe system.   [name] was instructed to call 911 with any severe reactions post vaccine:  Difficulty breathing   Swelling of face and throat   A fast heartbeat   A bad rash all over body   Dizziness and weakness   Immunizations Administered    No immunizations on file.

## 2020-07-02 ENCOUNTER — Other Ambulatory Visit: Payer: Self-pay | Admitting: Family Medicine

## 2020-07-02 DIAGNOSIS — R1013 Epigastric pain: Secondary | ICD-10-CM

## 2020-07-02 NOTE — Progress Notes (Signed)
reer

## 2020-07-11 ENCOUNTER — Encounter: Payer: Self-pay | Admitting: Internal Medicine

## 2020-07-12 ENCOUNTER — Encounter: Payer: Self-pay | Admitting: Internal Medicine

## 2020-07-12 ENCOUNTER — Telehealth: Payer: Self-pay | Admitting: Internal Medicine

## 2020-07-12 NOTE — Telephone Encounter (Signed)
Symptoms a started 11/26: On and off fever up to 99.7, some shortness of breath (mild), no chest pain but some tightness and a sporadic cough. A OTC Covid test was negative on11/27 I spoke with the patient today, he is not any worse. We agreed on: Get a PCR Covid testing today or tomorrow. Continue NyQuil, fluids and observation If here worse needs to be seen Okay to keep her up his appointment for 07/16/2020 as long as the Covid test is negative

## 2020-07-13 DIAGNOSIS — J069 Acute upper respiratory infection, unspecified: Secondary | ICD-10-CM | POA: Diagnosis not present

## 2020-07-14 DIAGNOSIS — C220 Liver cell carcinoma: Secondary | ICD-10-CM

## 2020-07-14 HISTORY — DX: Liver cell carcinoma: C22.0

## 2020-07-16 ENCOUNTER — Other Ambulatory Visit: Payer: Self-pay

## 2020-07-16 ENCOUNTER — Ambulatory Visit (INDEPENDENT_AMBULATORY_CARE_PROVIDER_SITE_OTHER): Payer: BC Managed Care – PPO | Admitting: Internal Medicine

## 2020-07-16 ENCOUNTER — Telehealth: Payer: Self-pay

## 2020-07-16 ENCOUNTER — Other Ambulatory Visit: Payer: Self-pay | Admitting: Internal Medicine

## 2020-07-16 ENCOUNTER — Ambulatory Visit (INDEPENDENT_AMBULATORY_CARE_PROVIDER_SITE_OTHER): Payer: BC Managed Care – PPO

## 2020-07-16 VITALS — BP 135/90 | HR 85 | Temp 98.7°F | Ht 70.0 in | Wt 200.6 lb

## 2020-07-16 DIAGNOSIS — R1013 Epigastric pain: Secondary | ICD-10-CM | POA: Diagnosis not present

## 2020-07-16 DIAGNOSIS — R160 Hepatomegaly, not elsewhere classified: Secondary | ICD-10-CM

## 2020-07-16 DIAGNOSIS — R079 Chest pain, unspecified: Secondary | ICD-10-CM

## 2020-07-16 DIAGNOSIS — R059 Cough, unspecified: Secondary | ICD-10-CM

## 2020-07-16 DIAGNOSIS — K8020 Calculus of gallbladder without cholecystitis without obstruction: Secondary | ICD-10-CM | POA: Diagnosis not present

## 2020-07-16 DIAGNOSIS — N200 Calculus of kidney: Secondary | ICD-10-CM

## 2020-07-16 DIAGNOSIS — R161 Splenomegaly, not elsewhere classified: Secondary | ICD-10-CM | POA: Diagnosis not present

## 2020-07-16 LAB — CBC WITH DIFFERENTIAL/PLATELET
Basophils Absolute: 0 10*3/uL (ref 0.0–0.1)
Basophils Relative: 0.5 % (ref 0.0–3.0)
Eosinophils Absolute: 0.1 10*3/uL (ref 0.0–0.7)
Eosinophils Relative: 2.6 % (ref 0.0–5.0)
HCT: 40.2 % (ref 39.0–52.0)
Hemoglobin: 13.7 g/dL (ref 13.0–17.0)
Lymphocytes Relative: 16.4 % (ref 12.0–46.0)
Lymphs Abs: 0.7 10*3/uL (ref 0.7–4.0)
MCHC: 34 g/dL (ref 30.0–36.0)
MCV: 85.6 fl (ref 78.0–100.0)
Monocytes Absolute: 0.4 10*3/uL (ref 0.1–1.0)
Monocytes Relative: 9.7 % (ref 3.0–12.0)
Neutro Abs: 3.1 10*3/uL (ref 1.4–7.7)
Neutrophils Relative %: 70.8 % (ref 43.0–77.0)
Platelets: 223 10*3/uL (ref 150.0–400.0)
RBC: 4.7 Mil/uL (ref 4.22–5.81)
RDW: 13.7 % (ref 11.5–15.5)
WBC: 4.4 10*3/uL (ref 4.0–10.5)

## 2020-07-16 LAB — COMPREHENSIVE METABOLIC PANEL
ALT: 31 U/L (ref 0–53)
AST: 69 U/L — ABNORMAL HIGH (ref 0–37)
Albumin: 3.7 g/dL (ref 3.5–5.2)
Alkaline Phosphatase: 83 U/L (ref 39–117)
BUN: 12 mg/dL (ref 6–23)
CO2: 29 mEq/L (ref 19–32)
Calcium: 8.8 mg/dL (ref 8.4–10.5)
Chloride: 103 mEq/L (ref 96–112)
Creatinine, Ser: 0.96 mg/dL (ref 0.40–1.50)
GFR: 88.57 mL/min (ref >60.00)
Glucose, Bld: 126 mg/dL — ABNORMAL HIGH (ref 70–99)
Potassium: 4 mEq/L (ref 3.5–5.1)
Sodium: 142 mEq/L (ref 135–145)
Total Bilirubin: 0.8 mg/dL (ref 0.2–1.2)
Total Protein: 6.3 g/dL (ref 6.0–8.3)

## 2020-07-16 LAB — LIPASE: Lipase: 17 U/L (ref 11.0–59.0)

## 2020-07-16 LAB — AMYLASE: Amylase: 19 U/L — ABNORMAL LOW (ref 27–131)

## 2020-07-16 NOTE — Patient Instructions (Signed)
   Recommend to go back on carvedilol and check your blood pressures. Restart your blood pressure medications gradually if your BP readings are more than 140/85.   GO TO THE LAB : Get the blood work     STOP BY THE FIRST FLOOR: Get the ultrasound  Further advised with results  ER this weekend if fever, chills, severe stomach pain, change in the color of the stools.

## 2020-07-16 NOTE — Telephone Encounter (Signed)
-----   Message from Colon Branch, MD sent at 07/16/2020 12:01 PM EST ----- Regarding: MRI   MRI of the abdomen with and without contrast.DX liver mass.  Not stat  but if we could do it within the next few days or Monday be fine

## 2020-07-16 NOTE — Progress Notes (Signed)
Subjective:    Patient ID: Robert Velazquez, male    DOB: 03-Dec-1965, 53 y.o.   MRN: [id number]  DOS:  07/16/2020 Type of visit - description: Acute The patient has developed multiple symptoms in the last 3 weeks:  Was doing really well with his diet eating at 1000 cal daily. On 06/25/2020 he went to a convention, he ate much more than usual including a large burger, shortly after developed epigastric pain described as a "knot". The pain is steady and has not stopped since. He also developed some heartburn, constipation.Marland Kitchen He was seen at this office 06/29/2020, PPIs increased, recommended MiraLAX.  During Thanksgiving 07/08/2020 he also developed respiratory symptoms including cough, chest tightness, fever. Since then had 2 neg Covid test including one this week. Cough is decreasing, still have subjective fever at night.  Has noticed his BP to be low and stop all the medications in the last few days, BP has been as low as 108 and he was feeling lethargic but he is better now.  Overall, he is mostly concerned about the abdominal pain, "I don't feel too bad  is just annoying"   Wt Readings from Last 3 Encounters:  07/16/20 200 lb 9.6 oz (91 kg)  06/29/20 202 lb 2 oz (91.7 kg)  06/16/20 207 lb (93.9 kg)      Review of Systems Denies nausea vomiting.  No blood in the stools. No dysphagia or odynophagia.  Past Medical History:  Diagnosis Date  . Annual physical exam 11/20/2014  . Arthritis    rheumatoid arthritis variant; Dr Jolee Ewing  . Chronic nasal congestion 09/24/2015  . Diabetes mellitus without complication ([place]) 1/49/7026  . Essential hypertension 08/18/2010   Qualifier: Diagnosis of  By: Linna Darner MD, Gwyndolyn Saxon    . Family history of early CAD 11/20/2014  . GERD (gastroesophageal reflux disease)   . 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 Globus sensation 12/22/2015  . History of chicken pox   . Hyperlipidemia    NMR LDL  140(1854/1324)TG 102,HDL 33  . Hypertension   . Rheumatoid arthritis ([place]) 02/09/2010   Qualifier: Diagnosis of  By: Inda Castle FNP, [place]  Dr Charlestine Night, Rheumatology      Past Surgical History:  Procedure Laterality Date  . EPIDURAL BLOCK INJECTION  2011-2014   ? 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 KNEE SURGERY     arthoscopic x2  . MOUTH SURGERY     wisdom teeth & tooth removed from palate & re-implated  . TONSILLECTOMY      Allergies as of 07/16/2020   No Known Allergies     Medication List       Accurate as of July 16, 2020  9:36 AM. If you have any questions, ask your nurse or doctor.        acetaminophen 325 MG tablet Commonly known as: TYLENOL Take 650 mg by mouth as needed. Reported on 11/29/2015   aspirin EC 81 MG tablet Take 81 mg by mouth daily.   carvedilol 25 MG tablet Commonly known as: COREG Take 1 tablet (25 mg total) by mouth 2 (two) times daily with a meal.   chlorthalidone 25 MG tablet Commonly known as: HYGROTON Take 1 tablet (25 mg total) by mouth daily.   CINNAMON PO Take by mouth.   cromolyn 5.2 MG/ACT nasal spray Commonly known as: NASALCROM Place 2 sprays into both nostrils 2 (two) times daily.   fluticasone  50 MCG/ACT nasal spray Commonly known as: FLONASE Place 2 sprays into both nostrils daily as needed for allergies or rhinitis.   folic acid 932 MCG tablet Commonly known as: FOLVITE Take 400 mcg by mouth daily.   losartan 100 MG tablet Commonly known as: COZAAR TAKE ONE TABLET BY MOUTH DAILY   metFORMIN 1000 MG tablet Commonly known as: GLUCOPHAGE Take 1 tablet (1,000 mg total) by mouth 2 (two) times daily with a meal.   methotrexate 2.5 MG tablet Commonly known as: RHEUMATREX Take 5 tablets by mouth weekly as directed.   multivitamin tablet Take 1 tablet by mouth daily.   pantoprazole 40 MG tablet Commonly known as: PROTONIX Take 1 tablet (40 mg total) by mouth 2 (two) times daily before a meal.    potassium chloride SA 20 MEQ tablet Commonly known as: Klor-Con M20 Take 1 tablet (20 mEq total) by mouth daily.   rosuvastatin 5 MG tablet Commonly known as: CRESTOR TAKE ONE TABLET BY MOUTH DAILY   TURMERIC PO Take by mouth.          Objective:   Physical Exam BP 135/90 (BP Location: Right Arm, Patient Position: Sitting, Cuff Size: Large)   Pulse 85   Temp 98.7 F (37.1 C) (Oral)   Ht 5\' 10"  (1.778 m)   Wt 200 lb 9.6 oz (91 kg)   SpO2 98%   BMI 28.78 kg/m  General:   Well developed, NAD, BMI noted. HEENT:  Normocephalic . Face symmetric, atraumatic Lungs:  CTA B Normal respiratory effort, no intercostal retractions, no accessory muscle use. Heart: RRR,  no murmur. Abdomen: Mild to moderately TTP at the epigastric area without mass or rebound.  Good bowel sounds. Lower extremities: no pretibial edema bilaterally  Skin: Not pale. Not jaundice Neurologic:  alert & oriented X3.  Speech normal, gait appropriate for age and unassisted Psych--  Cognition and judgment appear intact.  Cooperative with normal attention span and concentration.  Behavior appropriate. No anxious or depressed appearing.      Assessment      Assessment (transfer to me 11-2016) Diabetes (prediabetic since 2017)  HTN (amlodipine= edema noted 2019) Hyperlipidemia GERD R.A. Dx ~ mid 11s + FH CAD  PLAN: Acute epigastric pain: Symptoms started shortly after very large meal on 06/26/2019.  He also had some constipation.  Pain is a steady, does not radiate, denies substernal chest pain. Increasing PPIs helped to some extent but the pain is now gone. EKG today for completeness: NSR. Needs further evaluation, will get a stat CBC, CMP, amylase, lipase and ultrasound (cholelithiasis?). Cough: In the midst of his GI symptoms, he also developed upper respiratory symptoms, including a low-grade fever that he still has some nights.  We will get a chest x-ray.  (2 Covid tests were negative  recently) HTN: BP gradually decreasing in the last few weeks, much lower in the last few days.  To stop all BP medications 4 days ago.  Recommend to go back on carvedilol and gradually restart your medications, see AVS. Further advised with results Addendum -Blood work done chest x-ray okay. -[place]: Possibly a left hepatic lobe  mass.  MRI recommended. + Cholelithiasis but no cholecystitis.  Results discussed with the patient   Spoke with patient, aware of above, will schedule MRI of the abdomen with and without contrast. This visit occurred during the SARS-CoV-2 public health emergency.  Safety protocols were in place, including screening questions prior to the visit, additional usage of staff PPE, and extensive  cleaning of exam room while observing appropriate contact time as indicated for disinfecting solutions.

## 2020-07-16 NOTE — Telephone Encounter (Signed)
Order has been placed.

## 2020-07-18 NOTE — Assessment & Plan Note (Signed)
Acute epigastric pain: Symptoms started shortly after very large meal on 06/26/2019.  He also had some constipation.  Pain is a steady, does not radiate, denies substernal chest pain. Increasing PPIs helped to some extent but the pain is now gone. EKG today for completeness: NSR. Needs further evaluation, will get a stat CBC, CMP, amylase, lipase and ultrasound (cholelithiasis?). Cough: In the midst of his GI symptoms, he also developed upper respiratory symptoms, including a low-grade fever that he still has some nights.  We will get a chest x-ray.  (2 Covid tests were negative recently) HTN: BP gradually decreasing in the last few weeks, much lower in the last few days.  To stop all BP medications 4 days ago.  Recommend to go back on carvedilol and gradually restart your medications, see AVS. Further advised with results Addendum -Blood work done chest x-ray okay. -[place]: Possibly a left hepatic lobe  mass.  MRI recommended. + Cholelithiasis but no cholecystitis.  Results discussed with the patient

## 2020-07-19 ENCOUNTER — Encounter: Payer: Self-pay | Admitting: Internal Medicine

## 2020-07-21 ENCOUNTER — Other Ambulatory Visit: Payer: Self-pay | Admitting: Internal Medicine

## 2020-07-21 ENCOUNTER — Other Ambulatory Visit: Payer: Self-pay

## 2020-07-21 DIAGNOSIS — K769 Liver disease, unspecified: Secondary | ICD-10-CM

## 2020-07-21 DIAGNOSIS — R160 Hepatomegaly, not elsewhere classified: Secondary | ICD-10-CM

## 2020-07-22 ENCOUNTER — Encounter: Payer: Self-pay | Admitting: Oncology

## 2020-07-22 ENCOUNTER — Telehealth: Payer: Self-pay

## 2020-07-22 ENCOUNTER — Other Ambulatory Visit: Payer: Self-pay

## 2020-07-22 ENCOUNTER — Ambulatory Visit
Admission: RE | Admit: 2020-07-22 | Discharge: 2020-07-22 | Disposition: A | Discharge status: Home / Self Care | Payer: BC Managed Care – PPO | Source: Ambulatory Visit | Attending: Internal Medicine | Admitting: Internal Medicine

## 2020-07-22 DIAGNOSIS — R160 Hepatomegaly, not elsewhere classified: Secondary | ICD-10-CM

## 2020-07-22 DIAGNOSIS — C229 Malignant neoplasm of liver, not specified as primary or secondary: Secondary | ICD-10-CM

## 2020-07-22 DIAGNOSIS — Z79899 Other long term (current) drug therapy: Secondary | ICD-10-CM | POA: Diagnosis not present

## 2020-07-22 DIAGNOSIS — D1809 Hemangioma of other sites: Secondary | ICD-10-CM | POA: Diagnosis not present

## 2020-07-22 DIAGNOSIS — M0579 Rheumatoid arthritis with rheumatoid factor of multiple sites without organ or systems involvement: Secondary | ICD-10-CM | POA: Diagnosis not present

## 2020-07-22 MED ORDER — GADOBENATE DIMEGLUMINE 529 MG/ML IV SOLN
19.0000 mL | Freq: Once | INTRAVENOUS | Status: AC | PRN
  Administered 2020-07-22: 19 mL via INTRAVENOUS

## 2020-07-22 NOTE — Addendum Note (Signed)
Addended byDamita Dunnings D on: 07/22/2020 03:23 PM   Modules accepted: Orders

## 2020-07-22 NOTE — Telephone Encounter (Signed)
[facility] called to give a critical value report for the patient's MRI of abd.    Full report in chart .   1. Large complex mass occupying the near entirety of the LEFT hepatic lobe with extension into the LEFT portal vein to the confluence. 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 Recommend correlation with alpha fetoprotein and consider tissue sampling. 2. 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

## 2020-07-22 NOTE — Telephone Encounter (Signed)
  Will refer to oncology,  Dr.Sherrill or [facility] Medico.,  Patient aware.

## 2020-07-22 NOTE — Telephone Encounter (Signed)
Urgent oncology referral placed to Dr. Learta Codding.

## 2020-07-22 NOTE — Telephone Encounter (Signed)
MRI results discussed with the patient, emotional support provided. Offered something to help him sleep, he declined.  Plans discussed with family tonight. We will discuss with GI to see what is the next best step

## 2020-07-22 NOTE — Telephone Encounter (Signed)
Appt scheduled 07/27/2020 at 2pm.

## 2020-07-23 ENCOUNTER — Telehealth: Payer: Self-pay | Admitting: Oncology

## 2020-07-23 NOTE — Telephone Encounter (Signed)
Received a new pt referral from Dr. Larose Kells for [name]. Robert Velazquez has been cld and scheduled to see Dr. Benay Spice on 12/14 at 2pm. Pt aware to arrive 30 minutes early.

## 2020-07-27 ENCOUNTER — Inpatient Hospital Stay: Payer: BC Managed Care – PPO

## 2020-07-27 ENCOUNTER — Encounter (HOSPITAL_COMMUNITY): Payer: Self-pay | Admitting: Radiology

## 2020-07-27 ENCOUNTER — Other Ambulatory Visit: Payer: Self-pay

## 2020-07-27 ENCOUNTER — Inpatient Hospital Stay: Payer: BC Managed Care – PPO | Attending: Oncology | Admitting: Oncology

## 2020-07-27 ENCOUNTER — Telehealth: Payer: Self-pay | Admitting: Oncology

## 2020-07-27 VITALS — BP 143/92 | HR 64 | Temp 99.1°F | Resp 16 | Ht 70.0 in | Wt 200.8 lb

## 2020-07-27 DIAGNOSIS — R160 Hepatomegaly, not elsewhere classified: Secondary | ICD-10-CM

## 2020-07-27 DIAGNOSIS — I10 Essential (primary) hypertension: Secondary | ICD-10-CM | POA: Diagnosis not present

## 2020-07-27 DIAGNOSIS — E119 Type 2 diabetes mellitus without complications: Secondary | ICD-10-CM | POA: Diagnosis not present

## 2020-07-27 DIAGNOSIS — R1010 Upper abdominal pain, unspecified: Secondary | ICD-10-CM | POA: Diagnosis not present

## 2020-07-27 LAB — PROTIME-INR
INR: 1.1 (ref 0.8–1.2)
Prothrombin Time: 13.4 seconds (ref 11.4–15.2)

## 2020-07-27 NOTE — Telephone Encounter (Signed)
Scheduled appointment per 12/14 los. Spoke to patient who is aware of appointments dates and times.

## 2020-07-27 NOTE — Progress Notes (Signed)
Robert Velazquez Male, 54 y.o., 11/19/1965  MRN:  [id number] Phone:  [phone] Jerilynn Mages)       PCP:  Colon Branch, MD Coverage:  Sherre Poot [employer] Ppo  Next Appt With Oncology 08/09/2020 at 2:15 PM           RE: US Liver Biopsy Received: Today Arne Cleveland, MD  [place], Pinki Rottman D  Ok   [place] core L liver mass    DDH        Previous Messages   ----- Message -----  From: Garth Bigness D  Sent: 07/27/2020  3:23 PM EST  To: [facility] Procedure Requests  Subject: US Liver Biopsy                  Procedure:  US Liver Biopsy   Reason: Liver mass, left lobe, left liver mass, HCC ?cholangiocarcinoma?   History: [place], MR in computer   Provider: Ladell Pier   Provider Contact: [phone]

## 2020-07-27 NOTE — Progress Notes (Signed)
[facility] New Patient Consult   Requesting MD: Colon Branch, [address] [place],  [place] [address]   Robert Velazquez 54 y.o.  1966-03-18    Reason for Consult: Liver mass   HPI: Robert Velazquez reports the development of upper abdominal pain after a large meal on 06/25/2020.  He initially felt food was stuck in his stomach.  His symptoms did not improve with MiraLAX or Protonix.  He saw Dr. Larose Kells in follow-up on 07/16/2020.  An ultrasound of the abdomen on 07/16/2020 revealed heterogenous masslike enhancement of the left liver measuring 11 x 11 x 15 cm.  A 2 cm cyst was noted in the right liver.  The portal vein appeared patent.  The spleen is enlarged.  A 1.1 cm gallstone was noted.  He was referred for an MRI of the abdomen on 07/22/2020.  The MRI confirmed a left hepatic lobe mass measuring 15.8 x 10.5 x 10.6 cm.  The mass is complex with peripheral enhancement.  The mass extends into the left portal vein to the level of the confluence of the left and right portal vein.  A round enhancing lesion in segment 5 is concerning for a second focus of malignancy.  There is a benign cyst in the posterior right liver.  The spleen appeared normal.  No retroperitoneal or periportal lymphadenopathy.  A round lesion at T12 is favored to represent a hemangioma.    Past Medical History:  Diagnosis Date  . Annual physical exam 11/20/2014  . Arthritis    rheumatoid arthritis variant; Dr Jolee Ewing  . Chronic nasal congestion 09/24/2015  . Diabetes mellitus without complication ([place]) 12/10/7679  . Essential hypertension 08/18/2010   Qualifier: Diagnosis of  By: Linna Darner MD, Gwyndolyn Saxon    . Family history of early CAD 11/20/2014  . GERD (gastroesophageal reflux disease)   . 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 Globus sensation 12/22/2015  . History of chicken pox   . Hyperlipidemia    NMR LDL 140(1854/1324)TG 102,HDL 33  . Hypertension   .  Rheumatoid arthritis ([place]) 02/09/2010   Qualifier: Diagnosis of  By: Inda Castle FNP, [place]  Dr Charlestine Night, Rheumatology      Past Surgical History:  Procedure Laterality Date  . EPIDURAL BLOCK INJECTION  2011-2014   ? 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 KNEE SURGERY-right     arthoscopic x2  . MOUTH SURGERY     wisdom teeth & tooth removed from palate & re-implated  . TONSILLECTOMY-childhood      Medications: Reviewed  Allergies: No Known Allergies  Family history: No family history of cancer  Social History:   He lives with his wife in [place].  He works in a sales/marketing position.  He does not use cigarettes or alcohol.  No risk factor for HIV or hepatitis.  No transfusion history.  ROS:   Positives include: Intentional 40 pound weight loss since June 2021 including 25 pounds since 04/12/2020.  He is following a low calorie diet.  Occasional nausea.  Intermittent constipation and diarrhea.  Upper abdominal pain since 06/25/2020.  Intermittent right upper abdomen pain.  Upper respiratory infection ("bronchitis ") November 2021  A complete ROS was otherwise negative.  Physical Exam:  Blood pressure (!) 143/92, pulse 64, temperature 99.1 F (37.3 C), temperature source Tympanic, resp. rate 16, height 5\' 10"  (1.778 m), weight 200 lb 12.8 oz (  91.1 kg), SpO2 100 %.  HEENT: Neck without mass Lungs: Clear bilaterally Cardiac: Regular rate and rhythm Abdomen: No splenomegaly, mass is palpable in the mid upper abdomen extending to a few fingers above the umbilicus GU: Testes without mass Vascular: No leg edema Lymph nodes: No cervical, supraclavicular, axillary, or inguinal nodes Neurologic: Alert and oriented, the motor exam appears intact in the upper and lower extremities bilaterally  Skin: No rash  Musculoskeletal: No spine tenderness   LAB:  CBC  Lab Results  Component Value Date   WBC 4.4 07/16/2020   HGB 13.7 07/16/2020   HCT 40.2 07/16/2020   MCV 85.6  07/16/2020   PLT 223.0 07/16/2020   NEUTROABS 3.1 07/16/2020        CMP  Lab Results  Component Value Date   NA 142 07/16/2020   K 4.0 07/16/2020   CL 103 07/16/2020   CO2 29 07/16/2020   GLUCOSE 126 (H) 07/16/2020   BUN 12 07/16/2020   CREATININE 0.96 07/16/2020   CALCIUM 8.8 07/16/2020   PROT 6.3 07/16/2020   ALBUMIN 3.7 07/16/2020   AST 69 (H) 07/16/2020   ALT 31 07/16/2020   ALKPHOS 83 07/16/2020   BILITOT 0.8 07/16/2020   GFRNONAA >60 11/14/2018   GFRAA >60 11/14/2018     Imaging:  MRI images from 07/22/2020 reviewed with Robert Velazquez and his wife   Assessment/Plan:   1. Left liver mass  Abdominal ultrasound 07/16/2020-possible left hepatic mass, cholelithiasis, splenomegaly, nonobstructing left renal calculus  MRI abdomen 07/22/2020-15.8 x 10.5 x 10.6 cm left liver mass, extension into the left portal vein at the level of the confluence of the left and right portal vein, enhancing lesion in segment 5 concerning for a focus of malignancy, benign cyst in the posterior right liver, normal spleen 2. Diabetes 3. Hyperlipidemia 4. Upper abdominal pain secondary to #1 5. Intentional weight loss 6. Hypertension 7. Rheumatoid nephritis-off of methotrexate for 6-8 weeks   Disposition:   Robert Velazquez presents with upper abdominal pain for 1 month.  An abdominal MRI reveals a large left liver mass with involvement of the left portal vein and a possible small lesion in the right liver.  I discussed the differential diagnosis with Robert Velazquez and his wife.  We reviewed the MRI images.  The main differential diagnoses include hepatocellular carcinoma and cholangiocarcinoma.  He does not have risk factors for hepatocellular carcinoma.  We discussed potential treatment options including surgery, hepatic embolization, and systemic therapy.  Robert Velazquez will return to the lab for a CA 19-9 and AFP today.  He will be referred for a diagnostic biopsy.  He will continue Tylenol and  ibuprofen as needed for pain.  He will contact us for increased pain or new symptoms.  I will refer Robert Velazquez to Dr. Fayrene Helper for a surgical opinion and multidisciplinary review at [facility].  He will return for an office visit here on 08/09/2020.  Betsy Coder, MD  07/27/2020, 2:18 PM

## 2020-07-28 LAB — CANCER ANTIGEN 19-9: CA 19-9: 77 U/mL — ABNORMAL HIGH (ref 0–35)

## 2020-07-28 LAB — AFP TUMOR MARKER: AFP, Serum, Tumor Marker: 32570 ng/mL — ABNORMAL HIGH (ref 0.0–8.3)

## 2020-07-28 NOTE — Progress Notes (Signed)
Met with patient and his wife Robert Velazquez at his initial medical oncology consult with Dr. Julieanne Manson.  I explained my role as nurse navigator and they were given my card with my direct contact information.  They verbalized an understanding of the plan to obtain a liver biopsy on 08/03/2020 and he knows he is being referred to Dr. Fayrene Helper at [facility] for potential surgical intervention.

## 2020-07-29 ENCOUNTER — Telehealth: Payer: Self-pay

## 2020-07-29 NOTE — Telephone Encounter (Signed)
Patient called and made aware of lab results also confirmed with pt upcoming appt with Dr. Fayrene Helper scheduled for Monday(08-02-20) with biopsy Tuesday(08-03-20)

## 2020-07-29 NOTE — Telephone Encounter (Signed)
-----   Message from Ladell Pier, MD sent at 07/28/2020  7:36 AM EST ----- Please call patient, afp is markedly elevated suggests primary hepatocellular carcinoma, working on getting appt. With Dr. Fayrene Helper

## 2020-08-01 ENCOUNTER — Other Ambulatory Visit: Payer: Self-pay | Admitting: Radiology

## 2020-08-02 ENCOUNTER — Other Ambulatory Visit: Payer: Self-pay | Admitting: Radiology

## 2020-08-02 DIAGNOSIS — C220 Liver cell carcinoma: Secondary | ICD-10-CM | POA: Diagnosis not present

## 2020-08-03 ENCOUNTER — Ambulatory Visit (HOSPITAL_COMMUNITY)
Admission: RE | Admit: 2020-08-03 | Discharge: 2020-08-03 | Disposition: A | Discharge status: Home / Self Care | Payer: BC Managed Care – PPO | Source: Ambulatory Visit

## 2020-08-03 ENCOUNTER — Other Ambulatory Visit: Payer: Self-pay

## 2020-08-03 ENCOUNTER — Encounter (HOSPITAL_COMMUNITY): Payer: Self-pay

## 2020-08-03 ENCOUNTER — Ambulatory Visit (HOSPITAL_COMMUNITY)
Admission: RE | Admit: 2020-08-03 | Discharge: 2020-08-03 | Disposition: A | Discharge status: Home / Self Care | Payer: BC Managed Care – PPO | Source: Ambulatory Visit | Attending: Oncology | Admitting: Oncology

## 2020-08-03 DIAGNOSIS — C220 Liver cell carcinoma: Secondary | ICD-10-CM | POA: Insufficient documentation

## 2020-08-03 DIAGNOSIS — R772 Abnormality of alphafetoprotein: Secondary | ICD-10-CM | POA: Diagnosis not present

## 2020-08-03 DIAGNOSIS — K7689 Other specified diseases of liver: Secondary | ICD-10-CM | POA: Diagnosis not present

## 2020-08-03 DIAGNOSIS — R160 Hepatomegaly, not elsewhere classified: Secondary | ICD-10-CM | POA: Diagnosis not present

## 2020-08-03 LAB — COMPREHENSIVE METABOLIC PANEL
ALT: 44 U/L (ref 0–44)
AST: 128 U/L — ABNORMAL HIGH (ref 15–41)
Albumin: 3.7 g/dL (ref 3.5–5.0)
Alkaline Phosphatase: 102 U/L (ref 38–126)
Anion gap: 12 (ref 5–15)
BUN: 11 mg/dL (ref 6–20)
CO2: 26 mmol/L (ref 22–32)
Calcium: 8.9 mg/dL (ref 8.9–10.3)
Chloride: 102 mmol/L (ref 98–111)
Creatinine, Ser: 0.82 mg/dL (ref 0.61–1.24)
GFR, Estimated: >60 mL/min (ref >60)
Glucose, Bld: 129 mg/dL — ABNORMAL HIGH (ref 70–99)
Potassium: 3.9 mmol/L (ref 3.5–5.1)
Sodium: 140 mmol/L (ref 135–145)
Total Bilirubin: 1.4 mg/dL — ABNORMAL HIGH (ref 0.3–1.2)
Total Protein: 6.4 g/dL — ABNORMAL LOW (ref 6.5–8.1)

## 2020-08-03 LAB — CBC WITH DIFFERENTIAL/PLATELET
Abs Immature Granulocytes: 0.02 10*3/uL (ref 0.00–0.07)
Basophils Absolute: 0 10*3/uL (ref 0.0–0.1)
Basophils Relative: 1 %
Eosinophils Absolute: 0.1 10*3/uL (ref 0.0–0.5)
Eosinophils Relative: 2 %
HCT: 42.3 % (ref 39.0–52.0)
Hemoglobin: 13.6 g/dL (ref 13.0–17.0)
Immature Granulocytes: 0 %
Lymphocytes Relative: 16 %
Lymphs Abs: 0.8 10*3/uL (ref 0.7–4.0)
MCH: 27.6 pg (ref 26.0–34.0)
MCHC: 32.2 g/dL (ref 30.0–36.0)
MCV: 86 fL (ref 80.0–100.0)
Monocytes Absolute: 0.5 10*3/uL (ref 0.1–1.0)
Monocytes Relative: 11 %
Neutro Abs: 3.7 10*3/uL (ref 1.7–7.7)
Neutrophils Relative %: 70 %
Platelets: 154 10*3/uL (ref 150–400)
RBC: 4.92 MIL/uL (ref 4.22–5.81)
RDW: 13.3 % (ref 11.5–15.5)
WBC: 5.2 10*3/uL (ref 4.0–10.5)
nRBC: 0 % (ref 0.0–0.2)

## 2020-08-03 LAB — GLUCOSE, CAPILLARY: Glucose-Capillary: 128 mg/dL — ABNORMAL HIGH (ref 70–99)

## 2020-08-03 MED ORDER — FENTANYL CITRATE (PF) 100 MCG/2ML IJ SOLN
INTRAMUSCULAR | Status: AC | PRN
Start: 2020-08-03 — End: 2020-08-03
  Administered 2020-08-03 (×2): 50 ug via INTRAVENOUS

## 2020-08-03 MED ORDER — LIDOCAINE HCL (PF) 1 % IJ SOLN
INTRAMUSCULAR | Status: AC | PRN
  Administered 2020-08-03: 10 mL via INTRADERMAL

## 2020-08-03 MED ORDER — GELATIN ABSORBABLE 12-7 MM EX MISC
CUTANEOUS | Status: AC | PRN
  Administered 2020-08-03: 1 via TOPICAL

## 2020-08-03 MED ORDER — MIDAZOLAM HCL 2 MG/2ML IJ SOLN
INTRAMUSCULAR | Status: AC | PRN
  Administered 2020-08-03 (×4): 1 mg via INTRAVENOUS

## 2020-08-03 MED ORDER — MIDAZOLAM HCL 2 MG/2ML IJ SOLN
INTRAMUSCULAR | Status: AC
  Filled 2020-08-03: qty 4

## 2020-08-03 MED ORDER — FENTANYL CITRATE (PF) 100 MCG/2ML IJ SOLN
INTRAMUSCULAR | Status: AC
  Filled 2020-08-03: qty 2

## 2020-08-03 MED ORDER — LIDOCAINE HCL 1 % IJ SOLN
INTRAMUSCULAR | Status: AC
  Filled 2020-08-03: qty 20

## 2020-08-03 MED ORDER — SODIUM CHLORIDE 0.9 % IV SOLN: INTRAVENOUS | Status: DC

## 2020-08-03 MED ORDER — GELATIN ABSORBABLE 12-7 MM EX MISC
CUTANEOUS | Status: AC
  Filled 2020-08-03: qty 1

## 2020-08-03 NOTE — Discharge Instructions (Signed)
Liver Biopsy, Care After These instructions give you information about how to care for yourself after your procedure. Your health care provider may also give you more specific instructions. If you have problems or questions, contact your health care provider. What can I expect after the procedure? After your procedure, it is common to have:  Pain and soreness in the area where the biopsy was done.  Bruising around the area where the biopsy was done.  Sleepiness and fatigue for 1-2 days. Follow these instructions at home: Medicines  Take over-the-counter and prescription medicines only as told by your health care provider.  If you were prescribed an antibiotic medicine, take it as told by your health care provider. Do not stop taking the antibiotic even if you start to feel better.  Do not take medicines such as aspirin and ibuprofen unless your health care provider tells you to take them. These medicines thin your blood and can increase the risk of bleeding.  If you are taking prescription pain medicine, take actions to prevent or treat constipation. Your health care provider may recommend that you: ? Drink enough fluid to keep your urine pale yellow. ? Eat foods that are high in fiber, such as fresh fruits and vegetables, whole grains, and beans. ? Limit foods that are high in fat and processed sugars, such as fried or sweet foods. ? Take an over-the-counter or prescription medicine for constipation. Incision care  Follow instructions from your health care provider about how to take care of your incision. Make sure you: ? Wash your hands with soap and water before you change your bandage (dressing). If soap and water are not available, use hand sanitizer. ? Change your dressing as told by your health care provider. ? Leave stitches (sutures), skin glue, or adhesive strips in place. These skin closures may need to stay in place for 2 weeks or longer. If adhesive strip edges start to  loosen and curl up, you may trim the loose edges. Do not remove adhesive strips completely unless your health care provider tells you to do that.  Check your incision area every day for signs of infection. Check for: ? Redness, swelling, or pain. ? Fluid or blood. ? Warmth. ? Pus or a bad smell.  Do not take baths, swim, or use a hot tub until your health care provider says it is okay to do so. Activity   Rest at home for 1-2 days, or as directed by your health care provider. ? Avoid sitting for a long time without moving. Get up to take short walks every 1-2 hours. This is important to improve blood flow and breathing. Ask for help if you feel weak or unsteady.  Return to your normal activities as told by your health care provider. Ask your health care provider what activities are safe for you.  Do not drive or use heavy machinery while taking prescription pain medicine.  Do not lift anything that is heavier than 10 lb (4.5 kg), or the limit that your health care provider tells you, until he or she says that it is safe.  Do not play contact sports for 2 weeks after the procedure. General instructions   Do not drink alcohol in the first week after the procedure.  Have someone stay with you for at least 24 hours after the procedure.  It is your responsibility to obtain your test results. Ask your health care provider, or the department that is doing the test: ? When will my   results be ready? ? How will I get my results? ? What are my treatment options? ? What other tests do I need? ? What are my next steps?  Keep all follow-up visits as told by your health care provider. This is important. Contact a health care provider if:  You have increased bleeding from an incision, resulting in more than a small spot of blood.  You have redness, swelling, or increasing pain in any incisions.  You notice a discharge or a bad smell coming from any of your incisions.  You have a fever or  chills. Get help right away if:  You develop swelling, bloating, or pain in your abdomen.  You become dizzy or faint.  You develop a rash.  You have nausea or you vomit.  You faint, or you have shortness of breath or difficulty breathing.  You develop chest pain.  You have problems with your speech or vision.  You have trouble with your balance or moving your arms or legs. Summary  After the liver biopsy, it is common to have pain, soreness, and bruising in the area, as well as sleepiness and fatigue.  Take over-the-counter and prescription medicines only as told by your health care provider.  Follow instructions from your health care provider about how to care for your incision. Check the incision area daily for signs of infection. This information is not intended to replace advice given to you by your health care provider. Make sure you discuss any questions you have with your health care provider. Document Revised: 09/23/2018 Document Reviewed: 08/10/2017 Elsevier Patient Education  2020 Elsevier Inc.   Moderate Conscious Sedation, Adult, Care After These instructions provide you with information about caring for yourself after your procedure. Your health care provider may also give you more specific instructions. Your treatment has been planned according to current medical practices, but problems sometimes occur. Call your health care provider if you have any problems or questions after your procedure. What can I expect after the procedure? After your procedure, it is common:  To feel sleepy for several hours.  To feel clumsy and have poor balance for several hours.  To have poor judgment for several hours.  To vomit if you eat too soon. Follow these instructions at home: For at least 24 hours after the procedure:   Do not: ? Participate in activities where you could fall or become injured. ? Drive. ? Use heavy machinery. ? Drink alcohol. ? Take sleeping pills  or medicines that cause drowsiness. ? Make important decisions or sign legal documents. ? Take care of children on your own.  Rest. Eating and drinking  Follow the diet recommended by your health care provider.  If you vomit: ? Drink water, juice, or soup when you can drink without vomiting. ? Make sure you have little or no nausea before eating solid foods. General instructions  Have a responsible adult stay with you until you are awake and alert.  Take over-the-counter and prescription medicines only as told by your health care provider.  If you smoke, do not smoke without supervision.  Keep all follow-up visits as told by your health care provider. This is important. Contact a health care provider if:  You keep feeling nauseous or you keep vomiting.  You feel light-headed.  You develop a rash.  You have a fever. Get help right away if:  You have trouble breathing. This information is not intended to replace advice given to you by your health care   provider. Make sure you discuss any questions you have with your health care provider. Document Revised: 07/13/2017 Document Reviewed: 11/20/2015 Elsevier Patient Education  2020 Elsevier Inc.  

## 2020-08-03 NOTE — Procedures (Signed)
Interventional Radiology Procedure:   Indications: Liver mass  Procedure: US guided liver mass biopsy  Findings: Large mass occupying most of left hepatic lobe.  2 cores obtained.  Gelfoam slurry injected, no immediate bleeding.  Complications: None     EBL: less than 10 ml  Plan: Bedrest 3 hours   Robert Velazquez R. Anselm Pancoast, MD  Pager: [phone]

## 2020-08-03 NOTE — Consult Note (Signed)
Chief Complaint: Patient was seen in consultation today for image guided liver lesion biopsy  Referring Physician(s): Sherrill,Gary B  Supervising Physician: Markus Daft  Patient Status: [facility] - Out-pt  History of Present Illness: Robert Velazquez is a 54 y.o. male with past medical history of diabetes, hypertension, GERD, Gilbert's syndrome, hyperlipidemia, rheumatoid arthritis who presents now with epigastric discomfort and recent imaging revealing:  1. Large complex mass occupying the near entirety of the LEFT hepatic lobe with extension into the LEFT portal vein to the confluence. 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 Recommend correlation with alpha fetoprotein and consider tissue sampling. 2. 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 3. Solitary lesion in the RIGHT hepatic lobe concerning for bilobar disease. 4. Hemangioma of the T12 vertebral body.  Patient's CA 19-9 is 77 and AFP is 32,570.  He has no prior history of cancer.  He is scheduled today for image guided liver lesion biopsy for further evaluation.  Past Medical History:  Diagnosis Date  . Annual physical exam 11/20/2014  . Arthritis    rheumatoid arthritis variant; Dr Jolee Ewing  . Chronic nasal congestion 09/24/2015  . Diabetes mellitus without complication ([place]) 4/49/2010  . Essential hypertension 08/18/2010   Qualifier: Diagnosis of  By: Linna Darner MD, Gwyndolyn Saxon    . Family history of early CAD 11/20/2014  . GERD (gastroesophageal reflux disease)   . 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 Globus sensation 12/22/2015  . History of chicken pox   . Hyperlipidemia    NMR LDL 140(1854/1324)TG 102,HDL 33  . Hypertension   . Rheumatoid arthritis ([place]) 02/09/2010   Qualifier: Diagnosis of  By: Inda Castle FNP, [place]  Dr Charlestine Night, Rheumatology       Past Surgical History:  Procedure Laterality Date  . EPIDURAL BLOCK INJECTION  2011-2014   ? 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 KNEE SURGERY     arthoscopic x2  . MOUTH SURGERY     wisdom teeth & tooth removed from palate & re-implated  . TONSILLECTOMY      Allergies: Patient has no allergy information on record.  Medications: Prior to Admission medications   Medication Sig Start Date End Date Taking? Authorizing Provider  acetaminophen (TYLENOL) 325 MG tablet Take 650 mg by mouth as needed. Reported on 11/29/2015   Yes [provider]  carvedilol (COREG) 25 MG tablet Take 1 tablet (25 mg total) by mouth 2 (two) times daily with a meal. 01/21/20  Yes Paz, Alda Berthold, MD  metFORMIN (GLUCOPHAGE) 1000 MG tablet Take 1 tablet (1,000 mg total) by mouth 2 (two) times daily with a meal. 03/19/20  Yes Paz, Alda Berthold, MD  Multiple Vitamin (MULTIVITAMIN) tablet Take 1 tablet by mouth daily.   Yes [provider]  pantoprazole (PROTONIX) 40 MG tablet TAKE 1 TABLET BY MOUTH 2 TIMES DAILY BEFORE A MEAL 07/16/20  Yes Colon Branch, MD  rosuvastatin (CRESTOR) 5 MG tablet TAKE ONE TABLET BY MOUTH DAILY 03/22/20  Yes Colon Branch, MD  aspirin EC 81 MG tablet Take 81 mg by mouth daily.    [provider]  chlorthalidone (HYGROTON) 25 MG tablet Take 1 tablet (25 mg total) by mouth daily. Patient not taking: No sig reported 06/14/20   Colon Branch, MD  CINNAMON PO Take by mouth. Patient not taking:  Reported on 07/27/2020    [provider]  cromolyn (NASALCROM) 5.2 MG/ACT nasal spray Place 2 sprays into both nostrils 2 (two) times daily. Patient not taking: Reported on 07/27/2020 09/29/19   Colon Branch, MD  fluticasone [facility]) 50 MCG/ACT nasal spray Place 2 sprays into both nostrils daily as needed for allergies or rhinitis. 07/14/19   Colon Branch, MD  folic acid (FOLVITE) 400 MCG tablet Take 400 mcg by mouth daily. Patient not taking: Reported on 07/27/2020    [provider]  losartan (COZAAR) 100 MG tablet TAKE ONE TABLET BY MOUTH DAILY Patient not taking: No sig reported 03/22/20   Colon Branch, MD  methotrexate (RHEUMATREX) 2.5 MG tablet Take 5 tablets by mouth weekly as directed. Patient not taking: No sig reported 12/11/17   Colon Branch, MD  potassium chloride SA (KLOR-CON M20) 20 MEQ tablet Take 1 tablet (20 mEq total) by mouth daily. Patient not taking: No sig reported 06/14/20   Colon Branch, MD  TURMERIC PO Take by mouth. Patient not taking: Reported on 07/27/2020    [provider]     Family History  Problem Relation Age of Onset  . Heart attack Father 23       MI ,died @ 66 (68rd MI)  . Arthritis Mother        ? type, ? not RA - Living  . Heart attack Paternal Uncle 58       MI  . Arthritis Paternal Grandmother        ??RA  . Heart attack Paternal Grandfather 15       MI  . Lung cancer Maternal Grandfather        smoker  . Heart attack Paternal Uncle   . Healthy Sister   . Asthma Son   . Allergies Son   . Healthy Son        #2  . Diabetes Neg Hx   . Stroke Neg Hx   . Colon cancer Neg Hx   . Colon polyps Neg Hx   . Rectal cancer Neg Hx   . Stomach cancer Neg Hx   . Prostate cancer Neg Hx     Social History   Socioeconomic History  . Marital status: Married    Spouse name: Not on file  . Number of children: 2  . Years of education: Not on file  . Highest education level: Not on file  Occupational History  . Occupation: MARKETING AND Scientist, clinical (histocompatibility and immunogenetics): [employer]  Tobacco Use  . Smoking status: Never Smoker  . Smokeless tobacco: Never Used  Vaping Use  . Vaping Use: Never used  Substance and Sexual Activity  . Alcohol use: No  . Drug use: No  . Sexual activity: Not on file  Other Topics Concern  . Not on file  Social History Narrative   Household- pt, wife, 1 children still at home, other child @ [employer]    Social Determinants of Health   Financial Resource Strain: Not on file  Food  Insecurity: Not on file  Transportation Needs: Not on file  Physical Activity: Not on file  Stress: Not on file  Social Connections: Not on file      Review of Systems denies fever, headache, chest pain, back pain, nausea, vomiting or bleeding.  Does have some mild dyspnea, occasional cough and epigastric discomfort.  Vital Signs: BP (!) 150/97 (BP Location: Right Arm)   Temp 98.1  F (36.7 C) (Oral)   Resp 18   Ht 5\' 10"  (1.778 m)   Wt 192 lb (87.1 kg)   SpO2 99%   BMI 27.55 kg/m   Physical Exam awake, alert.  Chest with few fine right bibasilar crackles, left clear.  Heart with regular rate and rhythm.  Abdomen soft, positive bowel sounds, some fullness/tenderness in the epigastric region.  No lower extremity edema.  Imaging: DG Chest 2 View  Result Date: 07/16/2020 CLINICAL DATA:  Cough and chest pain EXAM: CHEST - 2 VIEW COMPARISON:  11/13/2019 FINDINGS: The heart size and mediastinal contours are within normal limits. Both lungs are clear. The visualized skeletal structures are unremarkable. IMPRESSION: No active cardiopulmonary disease. Electronically Signed   By: Franchot Gallo M.D.   On: 07/16/2020 11:06   MR ABDOMEN WWO CONTRAST  Result Date: 07/22/2020 CLINICAL DATA:  Liver mass identified on ultrasound. MRI recommended for further characterization. Ultrasound obtained for epigastric pain. EXAM: MRI ABDOMEN WITHOUT AND WITH CONTRAST TECHNIQUE: Multiplanar multisequence MR imaging of the abdomen was performed both before and after the administration of intravenous contrast. CONTRAST:  53mL MULTIHANCE GADOBENATE DIMEGLUMINE 529 MG/ML IV SOLN COMPARISON:  Ultrasound 07/16/2020 FINDINGS: Lower chest:  Lung bases are clear. Hepatobiliary: Large mass expanding the LEFT hepatic lobe and occupying the entirety of segment 2 and segment 3 as well as involving segment 4A and 4B. Mass measures approximately 15.8 by 10.5 by 10.6 cm (image 16/series 3). The mass has restricted diffusion  (series 8). On postcontrast imaging the mass is complex with peripheral enhancement and a central lobulated arboreal pattern (image 50/series 14). The mass has clear extension into the LEFT portal vein to the level of the confluence of the LEFT and RIGHT portal vein (image 45/10). Round enhancing lesion in segment 5 of the liver ( image 57/series 12) is concerning for a focus of malignancy in the RIGHT hepatic lobe. A benign cyst is present in the posterior RIGHT hepatic lobe (image 49/12). The gallbladder is normal.  The common bile duct normal. Pancreas: Normal pancreatic parenchymal intensity. No ductal dilatation or inflammation. Spleen: Normal spleen. Adrenals/urinary tract: Adrenal glands and kidneys are normal. Stomach/Bowel: Stomach and limited of the small bowel is unremarkable Vascular/Lymphatic: Abdominal aortic normal caliber. No retroperitoneal periportal lymphadenopathy. Musculoskeletal: Rounded lesion in the T12 vertebral body measuring 16 mm on coronal image 6/series 2 is favored benign hemangioma IMPRESSION: 1. Large complex mass occupying the near entirety of the LEFT hepatic lobe with extension into the LEFT portal vein to the confluence. 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 Recommend correlation with alpha fetoprotein and consider tissue sampling. 2. 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 3. Solitary lesion in the RIGHT hepatic lobe concerning for bilobar disease. 4. Hemangioma of the T12 vertebral body. These results will be called to the ordering clinician or representative by the Radiologist Assistant, and communication documented in the PACS or [employer]. Electronically Signed   By: Suzy Bouchard M.D.   On: 07/22/2020 12:03   US Abdomen Complete  Result Date: 07/16/2020 CLINICAL DATA:  Epigastric pain. EXAM: ABDOMEN ULTRASOUND COMPLETE COMPARISON:  CT  abdomen and pelvis 11/14/2018. Abdominal ultrasound 02/09/2010. FINDINGS: Gallbladder: 1.1 cm gallstone. No gallbladder wall thickening. No sonographic Murphy sign noted by sonographer. Common bile duct: Diameter: 4 mm Liver: Heterogeneous echotexture and masslike enlargement of the left hepatic lobe over a region measuring approximately 11 x  11 x 15 cm but with poorly defined margins. 2 cm cyst in the right hepatic lobe. Portal vein is patent on color Doppler imaging with normal direction of blood flow towards the liver. IVC: No abnormality visualized. Pancreas: Poorly visualized. Spleen: Enlarged with a length of 15.4 cm and estimated volume of 751 cc. Right Kidney: Length: 12.1 cm. Echogenicity within normal limits. No mass or hydronephrosis visualized. Left Kidney: Length: 12.2 cm. Echogenicity within normal limits. 7 mm shadowing stone. No mass or hydronephrosis visualized. Abdominal aorta: No aneurysm visualized. Other findings: None. IMPRESSION: 1. Abnormal appearance of the left hepatic lobe with a possible large underlying mass. An abdominal MRI without and with contrast is recommended for further evaluation. 2. Cholelithiasis without evidence of cholecystitis. 3. 7 mm nonobstructing left renal calculus. 4. Splenomegaly. Electronically Signed   By: Logan Bores M.D.   On: 07/16/2020 11:51    Labs:  CBC: Recent Labs    12/16/19 0846 07/16/20 1014  WBC 4.8 4.4  HGB 16.1 13.7  HCT 46.6 40.2  PLT 154.0 223.0    COAGS: Recent Labs    07/27/20 1529  INR 1.1    BMP: Recent Labs    01/13/20 0827 02/05/20 0813 03/17/20 1000 07/16/20 1014  NA 140 139 139 142  K 3.2* 3.6 3.6 4.0  CL 99 102 102 103  CO2 29 29 26 29   GLUCOSE 175* 179* 174* 126*  BUN 19 15 18 12   CALCIUM 9.4 9.6 9.8 8.8  CREATININE 1.06 0.98 1.07 0.96    LIVER FUNCTION TESTS: Recent Labs    12/16/19 0846 07/16/20 1014  BILITOT 1.4* 0.8  AST 21 69*  ALT 32 31  ALKPHOS 41 83  PROT 6.2 6.3  ALBUMIN 4.3 3.7     TUMOR MARKERS: No results for input(s): AFPTM, CEA, CA199, CHROMGRNA in the last 8760 hours.  Assessment and Plan: 54 y.o. male with past medical history of diabetes, hypertension, GERD, Gilbert's syndrome, hyperlipidemia, rheumatoid arthritis who presents now with epigastric discomfort and recent imaging revealing:  1. Large complex mass occupying the near entirety of the LEFT hepatic lobe with extension into the LEFT portal vein to the confluence. 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 Recommend correlation with alpha fetoprotein and consider tissue sampling. 2. 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 3. Solitary lesion in the RIGHT hepatic lobe concerning for bilobar disease. 4. Hemangioma of the T12 vertebral body.  Patient's CA 19-9 is 77 and AFP is 32,570. T bili  1.4.  He has no prior history of cancer.  He is scheduled today for image guided liver lesion biopsy for further evaluation.Risks and benefits of procedure was discussed with the patient  including, but not limited to bleeding, infection, damage to adjacent structures or low yield requiring additional tests.  All of the questions were answered and there is agreement to proceed.  Consent signed and in chart.     Thank you for this interesting consult.  I greatly enjoyed meeting Robert Velazquez and look forward to participating in their care.  A copy of this report was sent to the requesting provider on this date.  Electronically Signed: D. Rowe Robert, PA-C 08/03/2020, 12:13 PM   I spent a total of 25 minutes  in face to face in clinical consultation, greater than 50% of which was counseling/coordinating care for image guided liver lesion biopsy

## 2020-08-04 ENCOUNTER — Other Ambulatory Visit: Payer: BC Managed Care – PPO

## 2020-08-04 LAB — SURGICAL PATHOLOGY

## 2020-08-09 ENCOUNTER — Inpatient Hospital Stay (HOSPITAL_BASED_OUTPATIENT_CLINIC_OR_DEPARTMENT_OTHER): Payer: BC Managed Care – PPO | Admitting: Nurse Practitioner

## 2020-08-09 ENCOUNTER — Other Ambulatory Visit: Payer: Self-pay

## 2020-08-09 ENCOUNTER — Other Ambulatory Visit: Payer: Self-pay | Admitting: *Deleted

## 2020-08-09 ENCOUNTER — Encounter: Payer: Self-pay | Admitting: Nurse Practitioner

## 2020-08-09 ENCOUNTER — Encounter: Payer: Self-pay | Admitting: Emergency Medicine

## 2020-08-09 VITALS — BP 133/100 | HR 77 | Temp 98.1°F | Resp 15 | Ht 70.0 in | Wt 196.3 lb

## 2020-08-09 DIAGNOSIS — C220 Liver cell carcinoma: Secondary | ICD-10-CM

## 2020-08-09 DIAGNOSIS — Z006 Encounter for examination for normal comparison and control in clinical research program: Secondary | ICD-10-CM

## 2020-08-09 DIAGNOSIS — R160 Hepatomegaly, not elsewhere classified: Secondary | ICD-10-CM | POA: Diagnosis not present

## 2020-08-09 DIAGNOSIS — E119 Type 2 diabetes mellitus without complications: Secondary | ICD-10-CM | POA: Diagnosis not present

## 2020-08-09 DIAGNOSIS — Z7189 Other specified counseling: Secondary | ICD-10-CM

## 2020-08-09 DIAGNOSIS — I10 Essential (primary) hypertension: Secondary | ICD-10-CM | POA: Diagnosis not present

## 2020-08-09 DIAGNOSIS — R1010 Upper abdominal pain, unspecified: Secondary | ICD-10-CM | POA: Diagnosis not present

## 2020-08-09 NOTE — Research (Signed)
EXACT SCIENCES2018-01:BLOOD SAMPLE COLLECTION TO EVALUATE BIOMARKERS IN SUBJECTS WITH UNTREATED SOLID TUMORS  08/09/20   Consent: Robert Velazquez was referred by Dr. Benay Spice to the exact sciences study.This Research officer, political party met with the patientfor 15 minutesin a private exam room.We reviewedtheExact Sciencesconsent (protocol version 3.0 dated 06/11/2018)and HIPPA form (protocol version 3.0 dated 06/11/2018)with patient in their entirety. Explained the purpose of the study along with potential risks and benefits of participation. Informed patient that participation is completely voluntary andhemay withdraw consent at any time, but once blood is drawn, it will be retained by [employer].Upon completion ofreview,hewas offered an opportunityto ask any questions or express any concerns.Hehad no questions at this time. The patient signedand dated the informed consent at 3:06pm, and HIPPA form at 3:07pm voluntarily.A copyof the signed consent and HIPPA formswere given to the patient.Eligibility criteria were reviewed by this research coordinator and he meets eligibility criteria.  Secondary eligibility review was performed by Robert Spurling, RN, Clinical Research Nurse.  Robert Velazquez Clinical Research Coordinator I  08/09/20 3:52 PM

## 2020-08-09 NOTE — Research (Signed)
EXACT SCIENCES 2018-01B: STOOL COLLECTION SUB-STUDY OF EXACT SCIENCES PROTOCOL 2018-01  08/09/20   Consent:  Robert Velazquez was presented with the informed consent and HIPAA forms  for the exact sciences 2018-01B stool collection sub-study, version 1.0 dated 01/24/2020.  These were reviewed with the patient in their entirety.  Explained the purpose of the study along with potential risks and benefits of participation. Informed patient that participation is completely voluntary andhemay withdraw consent at any time.  Upon completion ofreview,hewas offered an opportunityto ask any questions or express any concerns.Hehad no questions at this time. The patient signedand dated the informed consent at 3:06pm, and HIPPA form at 3:07pm voluntarily.A copyof the signed consent and HIPPA formswere given to the patient.Eligibility criteria were reviewed by this research coordinator and he meets eligibility criteria.  Secondary eligibility review was performed by Foye Spurling, RN, Clinical Research Nurse.  Stool Collection Instruction:  The patient was instructed on how to collect the stool sample for the study using a demonstration kit. The patient was advised to not ingest the solution in the collection kit.  Patient verbalized understanding and was given time to ask questions.  He was provided with contact information for this coordinator if he has any questions.  Clabe Seal Clinical Research Coordinator I  08/09/20 3:52 PM

## 2020-08-09 NOTE — Progress Notes (Signed)
  [place] OFFICE PROGRESS NOTE   Diagnosis: Hepatocellular carcinoma  INTERVAL HISTORY:   Robert Velazquez returns as scheduled.  He continues to have abdominal pain.  The pain is causing difficulty sleeping.  Tramadol has not been effective.  He has hydrocodone at home which he plans to try.  Objective:  Vital signs in last 24 hours:  Blood pressure (!) 133/100, pulse 77, temperature 98.1 F (36.7 C), temperature source Tympanic, resp. rate 15, height 5\' 10"  (1.778 m), weight 196 lb 4.8 oz (89 kg), SpO2 100 %.    Resp: Lungs clear bilaterally. Cardio: Regular rate and rhythm. GI: Abdomen is distended.  Liver markedly enlarged. Vascular: No leg edema.  Lab Results:  Lab Results  Component Value Date   WBC 5.2 08/03/2020   HGB 13.6 08/03/2020   HCT 42.3 08/03/2020   MCV 86.0 08/03/2020   PLT 154 08/03/2020   NEUTROABS 3.7 08/03/2020     Medications: I have reviewed the patient's current medications.  Assessment/Plan: 1. Left liver mass ? Abdominal ultrasound 07/16/2020-possible left hepatic mass, cholelithiasis, splenomegaly, nonobstructing left renal calculus ? MRI abdomen 07/22/2020-15.8 x 10.5 x 10.6 cm left liver mass, extension into the left portal vein at the level of the confluence of the left and right portal vein, enhancing lesion in segment 5 concerning for a focus of malignancy, benign cyst in the posterior right liver, normal spleen ? Dr. Fayrene Helper 08/02/2020-tumor inoperable.  Consider Y 90 and/or systemic therapy ? Biopsy liver lesion 08/03/2020-hepatocellular carcinoma 2. Diabetes 3. Hyperlipidemia 4. Upper abdominal pain secondary to #1 5. Intentional weight loss 6. Hypertension 7. Rheumatoid nephritis-off of methotrexate for 6-8 weeks   Disposition: Robert Velazquez has been diagnosed with hepatocellular carcinoma.  He has seen Dr. Fayrene Helper.  The tumor is inoperable.  Dr. Benay Spice reviewed this with Robert Velazquez and his wife at today's appointment and  discussed options to include systemic therapy and embolization.  Dr. Gearldine Shown recommendation is to initiate systemic therapy with atezolizumab/Avastin on a 3-week schedule, consider embolization at a later date.  We reviewed potential toxicities associated with atezolizumab including colitis, dermatologic toxicity, endocrinopathies, arthritis, hepatitis, pneumonitis, neuropathy, allergic reaction.  We reviewed potential toxicities associated with Avastin including bleeding, delayed wound healing, hypertension, epistaxis, bowel perforation, increased risk of blood clot/stroke, leukoencephalitis, proteinuria.  He agrees to proceed.  He will attend a chemotherapy education class. He will return on 08/17/2019 for cycle 1 atezolizumab/Avastin.  We will see him in follow-up about 2 weeks after cycle 1.  He will contact the office in the interim with any problems.  We specifically discussed poorly controlled pain.  Patient seen with Dr. Benay Spice.    Ned Card ANP/GNP-BC   08/09/2020  2:23 PM This was a shared visit with Ned Card.  Robert Velazquez was interviewed and examined.  He has been diagnosed with hepatocellular carcinoma.  The tumor is unresectable.  I discussed the case with Dr. Fayrene Helper last week.  Treatment options include systemic therapy and embolization.  I will present his case at the GI tumor conference here.  The plan is to begin treatment with systemic therapy.  I recommended atezolizumab and Avastin.  We reviewed potential toxicities associated with this regimen.  He agrees to proceed.  We will consider embolization therapy at a later date.    Julieanne Manson, MD

## 2020-08-09 NOTE — Progress Notes (Signed)
START OFF PATHWAY REGIMEN - Other   [id number]:Atezolizumab 1,200 mg IV D1 + Bevacizumab 15 mg/kg IV D1 q21 Days:   A cycle is every 21 Days:     Atezolizumab      Bevacizumab-xxxx   **Always confirm dose/schedule in your pharmacy ordering system**  Patient Characteristics: Intent of Therapy: Non-Curative / Palliative Intent, Discussed with Patient 

## 2020-08-09 NOTE — Progress Notes (Signed)
Patient was given printed information on Opdivo and Avastin.  I have scheduled him for chemo education class this Thursday 12/30 at 12:00 noon.

## 2020-08-10 ENCOUNTER — Telehealth: Payer: Self-pay

## 2020-08-10 ENCOUNTER — Telehealth: Payer: Self-pay | Admitting: Nurse Practitioner

## 2020-08-10 ENCOUNTER — Other Ambulatory Visit: Payer: Self-pay | Admitting: Nurse Practitioner

## 2020-08-10 DIAGNOSIS — C220 Liver cell carcinoma: Secondary | ICD-10-CM

## 2020-08-10 MED ORDER — OXYCODONE HCL 5 MG PO TABS: 5.0000 mg | ORAL_TABLET | Freq: Four times a day (QID) | ORAL | 0 refills | Status: DC | PRN

## 2020-08-10 NOTE — Telephone Encounter (Signed)
Scheduled appointments per 12/27 los. Spoke to patient who is aware of appointments dates and times.  

## 2020-08-10 NOTE — Telephone Encounter (Signed)
Patient calls back to let Dr. Truett Perna know that he took the Hydrocodone 5/325 one tablet last night for pain/sleep and it did not help at all.  He only has 4 pills left.  He is requesting that Dr. Truett Perna send in the Oxycodone he mentioned at yesterday's visit to the [facility] on file.

## 2020-08-11 ENCOUNTER — Telehealth: Payer: Self-pay | Admitting: Emergency Medicine

## 2020-08-11 ENCOUNTER — Other Ambulatory Visit: Payer: Self-pay

## 2020-08-11 ENCOUNTER — Encounter: Payer: Self-pay | Admitting: Nurse Practitioner

## 2020-08-11 NOTE — Telephone Encounter (Addendum)
EXACT SCIENCES2018-01,BLOOD SAMPLE COLLECTION TO EVALUATE BIOMARKERS IN SUBJECTS WITH UNTREATED SOLID TUMORS  08/11/20   Telephone call:  Diangelo confirmed his name on the phone.  I explained I was calling regarding the [employer] study.  I obtained family and substance history from patient over the phone and recorded his answers on the [employer] 2018-01 history worksheet.  Patient agreed to have research labs drawn on Monday, 08/16/2020, along with his routine labs during his lab appointment scheduled for 8:00am.    The patient has not been able to provide a stool sample yet for the 2018-01B sub-study due to issues with constipation and diarrhea.  He plans to try collecting the sample prior to Monday morning.  He is aware to not collect a sample after starting treatment on Monday.  I thanked the patient for his time and participation in the study.  He did not have any questions at this time.  Lavonna Rua Clinical Research Coordinator I  08/11/20  2:31 PM

## 2020-08-11 NOTE — Progress Notes (Signed)
The proposed treatment discussed in conference is for discussion purposes only and is not a binding recommendation.  The patients have not been physically examined, or presented with their treatment options.  Therefore, final treatment plans cannot be decided.   

## 2020-08-12 ENCOUNTER — Encounter: Payer: Self-pay | Admitting: Oncology

## 2020-08-12 ENCOUNTER — Telehealth: Payer: Self-pay | Admitting: *Deleted

## 2020-08-12 ENCOUNTER — Inpatient Hospital Stay: Payer: BC Managed Care – PPO

## 2020-08-12 ENCOUNTER — Other Ambulatory Visit: Payer: Self-pay

## 2020-08-12 ENCOUNTER — Other Ambulatory Visit: Payer: Self-pay | Admitting: Oncology

## 2020-08-12 MED ORDER — HYDROMORPHONE HCL 2 MG PO TABS: 2.0000 mg | ORAL_TABLET | ORAL | 0 refills | Status: DC | PRN

## 2020-08-12 MED ORDER — LORAZEPAM 0.5 MG PO TABS: 0.5000 mg | ORAL_TABLET | Freq: Every evening | ORAL | 0 refills | Status: DC | PRN

## 2020-08-12 NOTE — Telephone Encounter (Signed)
Notified by education nurse patient thinks oxycodone causes nausea and he can't sleep. Spoke with patient and has already tried OTC Tylenol PM and Zquil without relief. Dr. Truett Perna thinks nausea could be related to the cancer, but is willing to order hydromorphone 2 mg if he wishes to try this. Patient wants to try the hydromorphone--MD notified. Agreed to Ativan 0.5-1 mg hs prn for insomnia.

## 2020-08-12 NOTE — Progress Notes (Signed)
Pt returned my call so I introduced myself as his Dance movement psychotherapist and discussed copay assistance.  Pt gave me consent to apply in his behalf so I enrolledhim in the[employer] program.  He wasapproved for $25,000 for Tecentriq for12 monthsfrom12/30/21. Pt's responsibility will be as little as $5 per infusion.  Ptis overqualified for the [employer].  I will givehim my card on 08/18/20 for any questions or concerns he may have in the future.

## 2020-08-12 NOTE — Progress Notes (Signed)
Called pt to introduce myself as his Financial Resource Specialist, discuss copay assistance and the [employer] grant.  I left a msg requesting he return my call if he's interested in applying for the grants. 

## 2020-08-15 ENCOUNTER — Other Ambulatory Visit: Payer: Self-pay | Admitting: Oncology

## 2020-08-16 ENCOUNTER — Encounter: Payer: Self-pay | Admitting: Oncology

## 2020-08-16 ENCOUNTER — Other Ambulatory Visit: Payer: Self-pay

## 2020-08-16 ENCOUNTER — Encounter: Payer: Self-pay | Admitting: Emergency Medicine

## 2020-08-16 ENCOUNTER — Inpatient Hospital Stay: Payer: BC Managed Care – PPO

## 2020-08-16 ENCOUNTER — Inpatient Hospital Stay: Payer: BC Managed Care – PPO | Attending: Oncology

## 2020-08-16 VITALS — BP 117/78 | HR 72 | Temp 98.2°F | Resp 18

## 2020-08-16 DIAGNOSIS — Z5112 Encounter for antineoplastic immunotherapy: Secondary | ICD-10-CM | POA: Insufficient documentation

## 2020-08-16 DIAGNOSIS — C220 Liver cell carcinoma: Secondary | ICD-10-CM | POA: Insufficient documentation

## 2020-08-16 DIAGNOSIS — Z79899 Other long term (current) drug therapy: Secondary | ICD-10-CM | POA: Diagnosis not present

## 2020-08-16 DIAGNOSIS — Z006 Encounter for examination for normal comparison and control in clinical research program: Secondary | ICD-10-CM

## 2020-08-16 LAB — CBC WITH DIFFERENTIAL (CANCER CENTER ONLY)
Abs Immature Granulocytes: 0.04 10*3/uL (ref 0.00–0.07)
Basophils Absolute: 0 10*3/uL (ref 0.0–0.1)
Basophils Relative: 0 %
Eosinophils Absolute: 0.1 10*3/uL (ref 0.0–0.5)
Eosinophils Relative: 2 %
HCT: 41.2 % (ref 39.0–52.0)
Hemoglobin: 13.2 g/dL (ref 13.0–17.0)
Immature Granulocytes: 1 %
Lymphocytes Relative: 15 %
Lymphs Abs: 1 10*3/uL (ref 0.7–4.0)
MCH: 26.6 pg (ref 26.0–34.0)
MCHC: 32 g/dL (ref 30.0–36.0)
MCV: 83.1 fL (ref 80.0–100.0)
Monocytes Absolute: 0.7 10*3/uL (ref 0.1–1.0)
Monocytes Relative: 12 %
Neutro Abs: 4.4 10*3/uL (ref 1.7–7.7)
Neutrophils Relative %: 70 %
Platelet Count: 199 10*3/uL (ref 150–400)
RBC: 4.96 MIL/uL (ref 4.22–5.81)
RDW: 13.8 % (ref 11.5–15.5)
WBC Count: 6.3 10*3/uL (ref 4.0–10.5)
nRBC: 0 % (ref 0.0–0.2)

## 2020-08-16 LAB — CMP (CANCER CENTER ONLY)
ALT: 38 U/L (ref 0–44)
AST: 109 U/L — ABNORMAL HIGH (ref 15–41)
Albumin: 3.3 g/dL — ABNORMAL LOW (ref 3.5–5.0)
Alkaline Phosphatase: 120 U/L (ref 38–126)
Anion gap: 11 (ref 5–15)
BUN: 12 mg/dL (ref 6–20)
CO2: 25 mmol/L (ref 22–32)
Calcium: 9.2 mg/dL (ref 8.9–10.3)
Chloride: 104 mmol/L (ref 98–111)
Creatinine: 0.85 mg/dL (ref 0.61–1.24)
GFR, Estimated: >60 mL/min (ref >60)
Glucose, Bld: 146 mg/dL — ABNORMAL HIGH (ref 70–99)
Potassium: 4 mmol/L (ref 3.5–5.1)
Sodium: 140 mmol/L (ref 135–145)
Total Bilirubin: 1.3 mg/dL — ABNORMAL HIGH (ref 0.3–1.2)
Total Protein: 6.5 g/dL (ref 6.5–8.1)

## 2020-08-16 LAB — RESEARCH LABS

## 2020-08-16 LAB — TOTAL PROTEIN, URINE DIPSTICK: Protein, ur: NEGATIVE mg/dL

## 2020-08-16 MED ORDER — ATEZOLIZUMAB CHEMO INJECTION 1200 MG/20ML
1200.0000 mg | Freq: Once | INTRAVENOUS | Status: AC
  Administered 2020-08-16: 1200 mg via INTRAVENOUS
  Filled 2020-08-16: qty 20

## 2020-08-16 MED ORDER — SODIUM CHLORIDE 0.9 % IV SOLN
15.0000 mg/kg | Freq: Once | INTRAVENOUS | Status: AC
  Administered 2020-08-16: 1300 mg via INTRAVENOUS
  Filled 2020-08-16: qty 48

## 2020-08-16 MED ORDER — SODIUM CHLORIDE 0.9 % IV SOLN
Freq: Once | INTRAVENOUS | Status: DC
  Filled 2020-08-16: qty 250

## 2020-08-16 NOTE — Patient Instructions (Signed)
[facility] Discharge Instructions for Patients Receiving Chemotherapy  Today you received the following chemotherapy agents Tecentriq, Avastin   To help prevent nausea and vomiting after your treatment, we encourage you to take your nausea medication as directed by MD    If you develop nausea and vomiting that is not controlled by your nausea medication, call the clinic.   BELOW ARE SYMPTOMS THAT SHOULD BE REPORTED IMMEDIATELY:  *FEVER GREATER THAN 100.5 F  *CHILLS WITH OR WITHOUT FEVER  NAUSEA AND VOMITING THAT IS NOT CONTROLLED WITH YOUR NAUSEA MEDICATION  *UNUSUAL SHORTNESS OF BREATH  *UNUSUAL BRUISING OR BLEEDING  TENDERNESS IN MOUTH AND THROAT WITH OR WITHOUT PRESENCE OF ULCERS  *URINARY PROBLEMS  *BOWEL PROBLEMS  UNUSUAL RASH Items with * indicate a potential emergency and should be followed up as soon as possible.  Feel free to call the clinic should you have any questions or concerns. The clinic phone number is [phone].  Please show the CHEMO ALERT CARD at check-in to the Emergency Department and triage nurse.   

## 2020-08-16 NOTE — Progress Notes (Signed)
Ok to treat with elevated Ast and no urine protein today per Dr. Truett Perna

## 2020-08-16 NOTE — Progress Notes (Signed)
Pt discharged in no apparent distress. Pt left ambulatory without assistance. Pt aware of discharge instructions and verbalized understanding and had no further questions.  

## 2020-08-16 NOTE — Research (Signed)
EXACT SCIENCES2018-01,BLOOD SAMPLE COLLECTION TO EVALUATE BIOMARKERS IN SUBJECTS WITH UNTREATED SOLID TUMORS  07/13/20   Blood Collection: Robert Velazquez presented to the [facility] for a lab appointment and day one of his treatment. Blood was collected by the phlebotomist, Precious Bard 0830 in blood sample kit [id number] using fresh venipuncture.   The patient was given a $50 visa gift card after blood collection was complete.  He stated that he was unable to collect a stool sample prior to this morning.  He was instructed to throw away the stool collection kit.  The patient was thanked for his time and participation.  Clabe Seal Clinical Research Coordinator I  08/16/20 10:41 AM

## 2020-08-17 ENCOUNTER — Telehealth: Payer: Self-pay | Admitting: *Deleted

## 2020-08-17 LAB — AFP TUMOR MARKER: AFP, Serum, Tumor Marker: 47352 ng/mL — ABNORMAL HIGH (ref 0.0–8.3)

## 2020-08-19 ENCOUNTER — Other Ambulatory Visit: Payer: Self-pay | Admitting: Internal Medicine

## 2020-08-19 ENCOUNTER — Encounter: Payer: Self-pay | Admitting: *Deleted

## 2020-08-23 ENCOUNTER — Encounter: Payer: Self-pay | Admitting: Nurse Practitioner

## 2020-08-24 ENCOUNTER — Telehealth: Payer: Self-pay

## 2020-08-24 ENCOUNTER — Other Ambulatory Visit: Payer: Self-pay | Admitting: Nurse Practitioner

## 2020-08-24 DIAGNOSIS — C220 Liver cell carcinoma: Secondary | ICD-10-CM

## 2020-08-24 MED ORDER — HYDROCODONE-ACETAMINOPHEN 5-325 MG PO TABS: 1.0000 | ORAL_TABLET | Freq: Four times a day (QID) | ORAL | 0 refills | Status: DC | PRN

## 2020-08-24 NOTE — Telephone Encounter (Signed)
This nurse returns call to patient regarding pain regimen pt current taking hydromorphone and /or oxycodone but states when he was on hydrocodone-acetaminophen he found more relief pt asks pain meds be switched to Vicodin  5-325 and d/c others  Provider made aware

## 2020-08-25 ENCOUNTER — Encounter: Payer: Self-pay | Admitting: Internal Medicine

## 2020-08-25 ENCOUNTER — Ambulatory Visit (INDEPENDENT_AMBULATORY_CARE_PROVIDER_SITE_OTHER): Payer: BC Managed Care – PPO | Admitting: Internal Medicine

## 2020-08-25 VITALS — BP 122/82 | HR 80 | Ht 70.0 in | Wt 204.1 lb

## 2020-08-25 DIAGNOSIS — R6881 Early satiety: Secondary | ICD-10-CM

## 2020-08-25 DIAGNOSIS — R1010 Upper abdominal pain, unspecified: Secondary | ICD-10-CM

## 2020-08-25 DIAGNOSIS — C220 Liver cell carcinoma: Secondary | ICD-10-CM

## 2020-08-25 DIAGNOSIS — K5903 Drug induced constipation: Secondary | ICD-10-CM | POA: Diagnosis not present

## 2020-08-25 MED ORDER — LINACLOTIDE 145 MCG PO CAPS: 145.0000 ug | ORAL_CAPSULE | Freq: Every day | ORAL | 3 refills | Status: DC

## 2020-08-25 NOTE — Progress Notes (Signed)
Patient ID: Robert Velazquez, male   DOB: [birth year], 55 y.o.   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  He is here alone today.  He is known to me from his screening colonoscopy which I performed in June 2016.  This was normal.  He reports that over the last 2 months he developed upper abdominal pain and abdominal pressure.  This was after he lost 30+ pounds from June to October through diet.  He did the Robert Velazquez.  However he started develop more pressure in the abdomen and upper abdominal pain.  He also notices difficulty taking deep breath when he lies on his left side.  This led to imaging of the abdomen pelvis beginning with ultrasound in early December which showed a left hepatic mass, gallstones and splenomegaly.  This was followed up with an MRI on 07/22/2020 which showed a 16 x 11 x 11 cm left liver mass with extension into the left portal vein at the level of the confluence of the left and right portal vein.  He underwent a biopsy of this mass on 08/03/2020 which confirmed hepatocellular carcinoma.  He has started immunotherapy just in the last 10 days on 08/17/2019.  He is following with Robert Velazquez.  For his upper abdominal and gas pain he has continued pantoprazole 40 mg twice daily which he has been on for some time.  Prior to this he was on Prilosec but was having breakthrough reflux symptoms.  Caffeine makes his reflux symptoms worse.  He has had upper gas pain and simethicone has not helped.  He has tried to cut out sodas.  His bowel movements have been "spotty" and he is feeling more constipated of late.  He can go a week without bowel movement and then have 2 bowel movements in 1  day.  He reports usually when he does have a bowel movement the first 1 is solid and the second 1 is "mushy".  No report of rectal bleeding or melena.  For his upper abdominal pain he has tried tramadol which did not help at all.  Hydrocodone seem to help some and eventually oxycodone was prescribed.  This helped with the pain but made him feel numb and jittery and somewhat nauseous.  Hydromorphone did not help.  He is gone back to trying hydrocodone on occasion.  He is also recently been given a prescription for Ativan which helps with nausea but also helps him sleep.  He is mostly sleeping sitting up in a recliner and when he is in the bed he has a wedge to make him more comfortable.  Past Medical History:  Diagnosis Date  . Annual physical exam 11/20/2014  . Arthritis    rheumatoid arthritis variant; Dr Jolee Ewing  . Chronic nasal congestion 09/24/2015  . Diabetes mellitus without complication ([place]) 12/05/5359  . Essential hypertension 08/18/2010   Qualifier: Diagnosis of  By: Linna Darner MD, Gwyndolyn Saxon    . Family history of early CAD 11/20/2014  . GERD (gastroesophageal reflux disease)   . 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 Globus sensation 12/22/2015  . Hemangioma   . Hepatocellular carcinoma ([place]) 07/2020  . History of chicken pox   .  Hyperlipidemia    NMR LDL 140(1854/1324)TG 102,HDL 33  . Hypertension   . Rheumatoid arthritis ([place]) 02/09/2010   Qualifier: Diagnosis of  By: Inda Castle FNP, [place]  Dr Charlestine Night, Rheumatology      Past Surgical History:  Procedure Laterality Date  . COLONOSCOPY    . EPIDURAL BLOCK INJECTION  2011-2014   ? 6-7 total DDD ;RobertDahldorf & Dr Jacelyn Grip  . KNEE SURGERY     arthoscopic x2  . MOUTH SURGERY     wisdom teeth & tooth removed from palate & re-implated  . TONSILLECTOMY      Outpatient Medications Prior to Visit  Medication Sig Dispense Refill  . acetaminophen (TYLENOL) 325 MG tablet Take 650 mg by mouth as  needed. Reported on 11/29/2015    . carvedilol (COREG) 25 MG tablet Take 1 tablet (25 mg total) by mouth 2 (two) times daily with a meal. 180 tablet 1  . HYDROcodone-acetaminophen (NORCO/VICODIN) 5-325 MG tablet Take 1-2 tablets by mouth every 6 (six) hours as needed. 50 tablet 0  . LORazepam (ATIVAN) 0.5 MG tablet Take 1-2 tablets (0.5-1 mg total) by mouth at bedtime as needed for sleep. 60 tablet 0  . metFORMIN (GLUCOPHAGE) 1000 MG tablet Take 1 tablet (1,000 mg total) by mouth 2 (two) times daily with a meal. 180 tablet 1  . Multiple Vitamin (MULTIVITAMIN) tablet Take 1 tablet by mouth daily.    . pantoprazole (PROTONIX) 40 MG tablet TAKE 1 TABLET BY MOUTH 2 TIMES DAILY BEFORE A MEAL 60 tablet 3  . rosuvastatin (CRESTOR) 5 MG tablet TAKE ONE TABLET BY MOUTH DAILY 90 tablet 1  . aspirin EC 81 MG tablet Take 81 mg by mouth daily. (Patient not taking: Reported on 08/25/2020)    . fluticasone (FLONASE) 50 MCG/ACT nasal spray Place 2 sprays into both nostrils daily as needed for allergies or rhinitis. (Patient not taking: Reported on 08/25/2020) 16 g 12   No facility-administered medications prior to visit.    No Known Allergies  Family History  Problem Relation Age of Onset  . Heart attack Father 67       MI ,died @ 28 (70rd MI)  . Heart disease Father   . Arthritis Mother        ? type, ? not RA - Living  . Heart attack Paternal Uncle 42       MI  . Arthritis Paternal Grandmother        ??RA  . Heart attack Paternal Grandfather 80       MI  . Lung cancer Maternal Grandfather        smoker  . Heart attack Paternal Uncle   . Healthy Sister   . Asthma Son   . Allergies Son   . Healthy Son        #2  . Diabetes Neg Hx   . Stroke Neg Hx   . Colon cancer Neg Hx   . Colon polyps Neg Hx   . Rectal cancer Neg Hx   . Stomach cancer Neg Hx   . Prostate cancer Neg Hx     Social History   Tobacco Use  . Smoking status: Never Smoker  . Smokeless tobacco: Never Used  Vaping Use  .  Vaping Use: Never used  Substance Use Topics  . Alcohol use: No  . Drug use: No    ROS: As per history of present illness, otherwise negative  BP 122/82 (BP Location: Right Arm, Patient Position: Sitting, Cuff Size:  Normal)   Pulse 80   Ht 5\' 10"  (1.778 m)   Wt 204 lb 2 oz (92.6 kg)   BMI 29.29 kg/m  Constitutional: Well-developed and well-nourished. No distress. HEENT: Normocephalic and atraumatic.  No scleral icterus. Neck: Neck supple. Trachea midline. Cardiovascular: Normal rate, regular rhythm and intact distal pulses. No M/R/G Pulmonary/chest: Effort normal and breath sounds normal. No wheezing, rales or rhonchi. Abdominal: Soft, there is palpable upper abdominal mass which is tender to palpation in the epigastrium and right upper quadrant 3 fingerbreadths below the right costal margin, abdomen is nondistended with bowel sounds normal today.   Extremities: no clubbing, cyanosis, or edema Neurological: Alert and oriented to person place and time. Skin: Skin is warm and dry.  Psychiatric: Normal mood and affect. Behavior is normal.  RELEVANT LABS AND IMAGING: CBC    Component Value Date/Time   WBC 6.3 08/16/2020 0818   WBC 5.2 08/03/2020 1210   RBC 4.96 08/16/2020 0818   HGB 13.2 08/16/2020 0818   HCT 41.2 08/16/2020 0818   PLT 199 08/16/2020 0818   MCV 83.1 08/16/2020 0818   MCH 26.6 08/16/2020 0818   MCHC 32.0 08/16/2020 0818   RDW 13.8 08/16/2020 0818   LYMPHSABS 1.0 08/16/2020 0818   MONOABS 0.7 08/16/2020 0818   EOSABS 0.1 08/16/2020 0818   BASOSABS 0.0 08/16/2020 0818    CMP     Component Value Date/Time   NA 140 08/16/2020 0818   NA 144 04/09/2018 0000   K 4.0 08/16/2020 0818   CL 104 08/16/2020 0818   CO2 25 08/16/2020 0818   GLUCOSE 146 (H) 08/16/2020 0818   BUN 12 08/16/2020 0818   BUN 11 04/09/2018 0000   CREATININE 0.85 08/16/2020 0818   CALCIUM 9.2 08/16/2020 0818   PROT 6.5 08/16/2020 0818   ALBUMIN 3.3 (L) 08/16/2020 0818   AST 109 (H)  08/16/2020 0818   ALT 38 08/16/2020 0818   ALKPHOS 120 08/16/2020 0818   BILITOT 1.3 (H) 08/16/2020 0818   GFRNONAA >60 08/16/2020 0818   GFRAA >60 11/14/2018 2054   Doing wellMRI ABDOMEN WITHOUT AND WITH CONTRAST   TECHNIQUE: Multiplanar multisequence MR imaging of the abdomen was performed both before and after the administration of intravenous contrast.   CONTRAST:  18mL MULTIHANCE GADOBENATE DIMEGLUMINE 529 MG/ML IV SOLN   COMPARISON:  Ultrasound 07/16/2020   FINDINGS: Lower chest:  Lung bases are clear.   Hepatobiliary: Large mass expanding the LEFT hepatic lobe and occupying the entirety of segment 2 and segment 3 as well as involving segment 4A and 4B. Mass measures approximately 15.8 by 10.5 by 10.6 cm (image 16/series 3). The mass has restricted diffusion (series 8). On postcontrast imaging the mass is complex with peripheral enhancement and a central lobulated arboreal pattern (image 50/series 14). The mass has clear extension into the LEFT portal vein to the level of the confluence of the LEFT and RIGHT portal vein (image 45/10).   Round enhancing lesion in segment 5 of the liver ( image 57/series 12) is concerning for a focus of malignancy in the RIGHT hepatic lobe. A benign cyst is present in the posterior RIGHT hepatic lobe (image 49/12).   The gallbladder is normal.  The common bile duct normal.   Pancreas: Normal pancreatic parenchymal intensity. No ductal dilatation or inflammation.   Spleen: Normal spleen.   Adrenals/urinary tract: Adrenal glands and kidneys are normal.   Stomach/Bowel: Stomach and limited of the small bowel is unremarkable   Vascular/Lymphatic: Abdominal  aortic normal caliber. No retroperitoneal periportal lymphadenopathy.   Musculoskeletal: Rounded lesion in the T12 vertebral body measuring 16 mm on coronal image 6/series 2 is favored benign hemangioma   IMPRESSION: 1. Large complex mass occupying the near entirety of the  LEFT hepatic lobe with extension into the LEFT portal vein to the confluence. 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 Recommend correlation with alpha fetoprotein and consider tissue sampling. 2. 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 3. Solitary lesion in the RIGHT hepatic lobe concerning for bilobar disease. 4. Hemangioma of the T12 vertebral body.   These results will be called to the ordering clinician or representative by the Radiologist Assistant, and communication documented in the PACS or [employer].     Electronically Signed   By: Suzy Bouchard M.D.   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  1.  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  There is question as to whether this is causing extrinsic compression on the stomach or if there is direct involvement of the upper GI tract.  Robert Velazquez also wonders with portal vein involvement if he has developed portal hypertension and varices.  He is already on PPI therapy twice daily. -- Upper endoscopy; evaluate upper GI symptoms which are definitively related to [facility] but we will also exclude other upper GI pathology and see if there is direct involvement of the upper GI tract.  Also evaluate for varices and other complications of portal hypertension -- Continue pantoprazole 40 mg twice daily -- We discussed the risk, benefits and alternatives to upper endoscopy and  he is agreeable and wishes to proceed  2.  Constipation --exacerbated by narcotic pain medication.  Trial of Linzess 145 mcg daily   [id number], [address] [address], [address] [place],  [place] [id number]

## 2020-08-25 NOTE — Patient Instructions (Addendum)
Continue pantoprazole.  We have sent the following medications to your pharmacy for you to pick up at your convenience: Linzess 145 mcg daily.  You have been scheduled for an endoscopy. Please follow written instructions given to you at your visit today. If you use inhalers (even only as needed), please bring them with you on the day of your procedure.  If you are age 57 or older, your body mass index should be between 23-30. Your Body mass index is 29.29 kg/m. If this is out of the aforementioned range listed, please consider follow up with your Primary Care Provider.  If you are age 33 or younger, your body mass index should be between 19-25. Your Body mass index is 29.29 kg/m. If this is out of the aformentioned range listed, please consider follow up with your Primary Care Provider.   Due to recent changes in healthcare laws, you may see the results of your imaging and laboratory studies on MyChart before your provider has had a chance to review them.  We understand that in some cases there may be results that are confusing or concerning to you. Not all laboratory results come back in the same time frame and the provider may be waiting for multiple results in order to interpret others.  Please give [place] 48 hours in order for your provider to thoroughly review all the results before contacting the office for clarification of your results.

## 2020-08-26 ENCOUNTER — Ambulatory Visit (AMBULATORY_SURGERY_CENTER): Payer: BC Managed Care – PPO | Admitting: Internal Medicine

## 2020-08-26 ENCOUNTER — Encounter: Payer: Self-pay | Admitting: Internal Medicine

## 2020-08-26 ENCOUNTER — Other Ambulatory Visit: Payer: Self-pay

## 2020-08-26 VITALS — BP 128/92 | HR 68 | Temp 96.2°F | Resp 22 | Ht 70.0 in | Wt 204.0 lb

## 2020-08-26 DIAGNOSIS — K2970 Gastritis, unspecified, without bleeding: Secondary | ICD-10-CM

## 2020-08-26 DIAGNOSIS — K219 Gastro-esophageal reflux disease without esophagitis: Secondary | ICD-10-CM | POA: Diagnosis not present

## 2020-08-26 DIAGNOSIS — K319 Disease of stomach and duodenum, unspecified: Secondary | ICD-10-CM | POA: Diagnosis not present

## 2020-08-26 DIAGNOSIS — K2950 Unspecified chronic gastritis without bleeding: Secondary | ICD-10-CM | POA: Diagnosis not present

## 2020-08-26 DIAGNOSIS — K2990 Gastroduodenitis, unspecified, without bleeding: Secondary | ICD-10-CM

## 2020-08-26 MED ORDER — HYOSCYAMINE SULFATE 0.125 MG SL SUBL: 0.1250 mg | SUBLINGUAL_TABLET | Freq: Three times a day (TID) | SUBLINGUAL | 1 refills | Status: AC | PRN

## 2020-08-26 NOTE — Progress Notes (Signed)
Called to room to assist during endoscopic procedure.  Patient ID and intended procedure confirmed with present staff. Received instructions for my participation in the procedure from the performing physician.  

## 2020-08-26 NOTE — Progress Notes (Signed)
Report to PACU, RN, vss, BBS= Clear.  

## 2020-08-26 NOTE — Op Note (Signed)
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 The patient's tolerance of                            previous anesthesia was also reviewed. The risks                            and benefits of the procedure and the sedation                            options and risks were discussed with the patient.                            All questions were answered, and informed consent                            was obtained. Prior Anticoagulants: The patient has                            taken no previous anticoagulant or antiplatelet                            agents. ASA Grade Assessment: III - A patient with                            severe systemic disease. After reviewing the risks                            and benefits, the patient was deemed in  satisfactory condition to undergo the procedure.                           After obtaining informed consent, the endoscope was                            passed under direct vision. Throughout the                            procedure, the patient's blood pressure, pulse, and                            oxygen saturations were monitored continuously. The                            Endoscope was introduced through the mouth, and                             advanced to the second part of duodenum. The upper                            GI endoscopy was accomplished without difficulty.                            The patient tolerated the procedure well. Scope In: Scope Out: Findings:                 The Z-line was irregular and was found 39 cm from                            the incisors. Biopsies not performed today given                            ongoing treatment for HCC.                           There is no endoscopic evidence of varices in the                            entire esophagus.                           The cardia and gastric fundus were normal on                            retroflexion.                           Mild inflammation characterized by erythema was                            found in the gastric antrum. Biopsies were taken                            with a cold forceps for histology and Helicobacter  pylori testing.                           There is no endoscopic evidence of varices in the                            cardia and in the gastric fundus.                           The examined duodenum was normal. Complications:            No immediate complications. Estimated Blood Loss:     Estimated blood loss was minimal. Impression:               - Z-line irregular, 39 cm from the incisors.                           - Gastritis. Biopsied.                           - Normal examined duodenum.                           - No evidence of changes from portal hypertension                            in the upper GI tract. Recommendation:           - Patient has a contact number available for                            emergencies. The signs and symptoms of potential                            delayed complications were discussed with the                            patient. Return to normal activities tomorrow.                            Written discharge instructions were  provided to the                            patient.                           - Resume previous diet.                           - Continue present medications.                           - Await pathology results. Jerene Bears, MD 08/26/2020 10:07:54 AM This report has been signed electronically.

## 2020-08-26 NOTE — Patient Instructions (Signed)
Handout provided on gastritis.   Use Levsin 0.125mg , 1 tablet sublingual 3 times daily as needed for abdominal cramping.   YOU HAD AN ENDOSCOPIC PROCEDURE TODAY AT [facility]:   Refer to the procedure report that was given to you for any specific questions about what was found during the examination.  If the procedure report does not answer your questions, please call your gastroenterologist to clarify.  If you requested that your care partner not be given the details of your procedure findings, then the procedure report has been included in a sealed envelope for you to review at your convenience later.  YOU SHOULD EXPECT: Some feelings of bloating in the abdomen. Passage of more gas than usual.  Walking can help get rid of the air that was put into your GI tract during the procedure and reduce the bloating. If you had a lower endoscopy (such as a colonoscopy or flexible sigmoidoscopy) you may notice spotting of blood in your stool or on the toilet paper. If you underwent a bowel prep for your procedure, you may not have a normal bowel movement for a few days.  Please Note:  You might notice some irritation and congestion in your nose or some drainage.  This is from the oxygen used during your procedure.  There is no need for concern and it should clear up in a day or so.  SYMPTOMS TO REPORT IMMEDIATELY:   Following upper endoscopy (EGD)  Vomiting of blood or coffee ground material  New chest pain or pain under the shoulder blades  Painful or persistently difficult swallowing  New shortness of breath  Fever of 100F or higher  Black, tarry-looking stools  For urgent or emergent issues, a gastroenterologist can be reached at any hour by calling [phone]. Do not use MyChart messaging for urgent concerns.    DIET:  We do recommend a small meal at first, but then you may proceed to your regular diet.  Drink plenty of fluids but you should avoid alcoholic beverages for 24  hours.  ACTIVITY:  You should plan to take it easy for the rest of today and you should NOT DRIVE or use heavy machinery until tomorrow (because of the sedation medicines used during the test).    FOLLOW UP: Our staff will call the number listed on your records 48-72 hours following your procedure to check on you and address any questions or concerns that you may have regarding the information given to you following your procedure. If we do not reach you, we will leave a message.  We will attempt to reach you two times.  During this call, we will ask if you have developed any symptoms of COVID 19. If you develop any symptoms (ie: fever, flu-like symptoms, shortness of breath, cough etc.) before then, please call [phone].  If you test positive for Covid 19 in the 2 weeks post procedure, please call and report this information to [place].    If any biopsies were taken you will be contacted by phone or by letter within the next 1-3 weeks.  Please call us at [phone] if you have not heard about the biopsies in 3 weeks.    SIGNATURES/CONFIDENTIALITY: You and/or your care partner have signed paperwork which will be entered into your electronic medical record.  These signatures attest to the fact that that the information above on your After Visit Summary has been reviewed and is understood.  Full responsibility of the confidentiality of this  discharge information lies with you and/or your care-partner.

## 2020-08-30 ENCOUNTER — Ambulatory Visit: Payer: BC Managed Care – PPO | Admitting: Oncology

## 2020-08-30 ENCOUNTER — Other Ambulatory Visit: Payer: Self-pay | Admitting: Oncology

## 2020-08-31 ENCOUNTER — Telehealth: Payer: Self-pay

## 2020-08-31 ENCOUNTER — Other Ambulatory Visit: Payer: Self-pay

## 2020-08-31 ENCOUNTER — Inpatient Hospital Stay (HOSPITAL_BASED_OUTPATIENT_CLINIC_OR_DEPARTMENT_OTHER): Payer: BC Managed Care – PPO | Admitting: Oncology

## 2020-08-31 VITALS — BP 131/91 | HR 77 | Temp 99.0°F | Resp 13 | Ht 70.0 in | Wt 202.0 lb

## 2020-08-31 DIAGNOSIS — C220 Liver cell carcinoma: Secondary | ICD-10-CM | POA: Diagnosis not present

## 2020-08-31 DIAGNOSIS — Z5112 Encounter for antineoplastic immunotherapy: Secondary | ICD-10-CM | POA: Diagnosis not present

## 2020-08-31 DIAGNOSIS — Z79899 Other long term (current) drug therapy: Secondary | ICD-10-CM | POA: Diagnosis not present

## 2020-08-31 NOTE — Progress Notes (Signed)
[facility] OFFICE PROGRESS NOTE   Diagnosis: Hepatocellular carcinoma  INTERVAL HISTORY:   Robert Velazquez completed cycle 1 of atezoluzmab/Avastin on 08/16/2020.  No rash or diarrhea.  He reports mild nosebleeding.  He noted hoarseness beginning a few days after receiving treatment.  The hoarseness has persisted.  No dyspnea.  He continues to have abdominal pain.  The pain is relieved with hydrocodone.  Linzess has helped constipation. He underwent an upper endoscopy by Dr. Hilarie Fredrickson on 08/26/2020.  There was no evidence of esophageal varices.  Mild inflammation in the gastric antrum was biopsied.  No varices in the cardia or gastric fundus.  Objective:  Vital signs in last 24 hours:  Blood pressure (!) 131/91, pulse 77, temperature 99 F (37.2 C), temperature source Tympanic, resp. rate 13, height 5\' 10"  (1.778 m), weight 202 lb (91.6 kg), SpO2 98 %.    HEENT: No thrush or ulcers Resp: Coarse end inspiratory rhonchi at the left posterior base, no respiratory distress Cardio: Regular rate and rhythm GI: The liver is palpable throughout the upper abdomen with tenderness at the left liver Vascular: No leg edema      Lab Results:  Lab Results  Component Value Date   WBC 6.3 08/16/2020   HGB 13.2 08/16/2020   HCT 41.2 08/16/2020   MCV 83.1 08/16/2020   PLT 199 08/16/2020   NEUTROABS 4.4 08/16/2020    CMP  Lab Results  Component Value Date   NA 140 08/16/2020   K 4.0 08/16/2020   CL 104 08/16/2020   CO2 25 08/16/2020   GLUCOSE 146 (H) 08/16/2020   BUN 12 08/16/2020   CREATININE 0.85 08/16/2020   CALCIUM 9.2 08/16/2020   PROT 6.5 08/16/2020   ALBUMIN 3.3 (L) 08/16/2020   AST 109 (H) 08/16/2020   ALT 38 08/16/2020   ALKPHOS 120 08/16/2020   BILITOT 1.3 (H) 08/16/2020   GFRNONAA >60 08/16/2020   GFRAA >60 11/14/2018    No results found for: CEA1  Lab Results  Component Value Date   INR 1.1 07/27/2020    Imaging:  No results found.  Medications: I  have reviewed the patient's current medications.   Assessment/Plan: 1. Left liver mass ? Abdominal ultrasound 07/16/2020-possible left hepatic mass, cholelithiasis, splenomegaly, nonobstructing left renal calculus ? MRI abdomen 07/22/2020-15.8 x 10.5 x 10.6 cm left liver mass, extension into the left portal vein at the level of the confluence of the left and right portal vein, enhancing lesion in segment 5 concerning for a focus of malignancy, benign cyst in the posterior right liver, normal spleen ? Dr. Fayrene Helper 08/02/2020-tumor inoperable.  Consider Y 90 and/or systemic therapy ? Biopsy liver lesion 08/03/2020-hepatocellular carcinoma ? Cycle 1 atezolizumab/bevacizumab 08/16/2020 2. Diabetes 3. Hyperlipidemia 4. Upper abdominal pain secondary to #1 5. Intentional weight loss 6. Hypertension 7. 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  He has completed 1 cycle of systemic therapy.  He tolerated the treatment well.  He will continue hydrocodone as needed for pain.  He will return for cycle 2 on 09/06/2020.  His case was presented at the GI tumor conference.  He has no clear risk factor for HCC.  We will obtain a hemochromatosis gene study and hepatitis serologies when he is here on 09/06/2020.  The etiology of the hoarseness is unclear.  We will refer him for a chest CT if the hoarseness persist.  I doubt this is related to the systemic therapy  as the hoarseness began within a few days of treatment.  It is possible this is related to dysphonia from Avastin.  Betsy Coder, MD  08/31/2020  1:51 PM

## 2020-08-31 NOTE — Telephone Encounter (Signed)
  Follow up Call-  Call back number 08/26/2020  Post procedure Call Back phone  # [phone]  Permission to leave phone message Yes  Some recent data might be hidden     Patient questions:  Do you have a fever, pain , or abdominal swelling? No. Pain Score  0 *  Have you tolerated food without any problems? Yes.    Have you been able to return to your normal activities? Yes.    Do you have any questions about your discharge instructions: Diet   No. Medications  No. Follow up visit  No.  Do you have questions or concerns about your Care? No.  Actions: * If pain score is 4 or above: 1. No action needed, pain <4.Have you developed a fever since your procedure? no  2.   Have you had an respiratory symptoms (SOB or cough) since your procedure? no  3.   Have you tested positive for COVID 19 since your procedure no  4.   Have you had any family members/close contacts diagnosed with the COVID 19 since your procedure?  no   If yes to any of these questions please route to Joylene John, RN and Joella Prince, RN

## 2020-08-31 NOTE — Progress Notes (Deleted)
  [facility] OFFICE PROGRESS NOTE   Diagnosis:   INTERVAL HISTORY:   ***  Objective:  Vital signs in last 24 hours:  Blood pressure (!) 131/91, pulse 77, temperature 99 F (37.2 C), temperature source Tympanic, resp. rate 13, height 5\' 10"  (1.778 m), weight 202 lb (91.6 kg), SpO2 98 %.    HEENT: *** Lymphatics: *** Resp: *** Cardio: *** GI: *** Vascular: *** Neuro:***  Skin:***   Portacath/PICC-without erythema  Lab Results:  Lab Results  Component Value Date   WBC 6.3 08/16/2020   HGB 13.2 08/16/2020   HCT 41.2 08/16/2020   MCV 83.1 08/16/2020   PLT 199 08/16/2020   NEUTROABS 4.4 08/16/2020    CMP  Lab Results  Component Value Date   NA 140 08/16/2020   K 4.0 08/16/2020   CL 104 08/16/2020   CO2 25 08/16/2020   GLUCOSE 146 (H) 08/16/2020   BUN 12 08/16/2020   CREATININE 0.85 08/16/2020   CALCIUM 9.2 08/16/2020   PROT 6.5 08/16/2020   ALBUMIN 3.3 (L) 08/16/2020   AST 109 (H) 08/16/2020   ALT 38 08/16/2020   ALKPHOS 120 08/16/2020   BILITOT 1.3 (H) 08/16/2020   GFRNONAA >60 08/16/2020   GFRAA >60 11/14/2018    No results found for: CEA1  Lab Results  Component Value Date   INR 1.1 07/27/2020    Imaging:  No results found.  Medications: I have reviewed the patient's current medications.   Assessment/Plan:  1. Left liver mass ? Abdominal ultrasound 07/16/2020-possible left hepatic mass, cholelithiasis, splenomegaly, nonobstructing left renal calculus ? MRI abdomen 07/22/2020-15.8 x 10.5 x 10.6 cm left liver mass, extension into the left portal vein at the level of the confluence of the left and right portal vein, enhancing lesion in segment 5 concerning for a focus of malignancy, benign cyst in the posterior right liver, normal spleen ? Dr. Fayrene Helper 08/02/2020-tumor inoperable.  Consider Y 90 and/or systemic therapy ? Biopsy liver lesion 08/03/2020-hepatocellular carcinoma 2. Diabetes 3. Hyperlipidemia 4. Upper abdominal  pain secondary to #1 5. Intentional weight loss 6. Hypertension 7. Rheumatoid nephritis-off of methotrexate for 6-8 weeks   Disposition: ***  Betsy Coder, MD  08/31/2020  1:25 PM

## 2020-09-02 ENCOUNTER — Other Ambulatory Visit: Payer: Self-pay

## 2020-09-02 ENCOUNTER — Encounter: Payer: Self-pay | Admitting: Internal Medicine

## 2020-09-02 MED ORDER — LINACLOTIDE 72 MCG PO CAPS: 72.0000 ug | ORAL_CAPSULE | Freq: Every day | ORAL | 3 refills | Status: DC

## 2020-09-02 MED ORDER — METOCLOPRAMIDE HCL 5 MG PO TABS: 5.0000 mg | ORAL_TABLET | Freq: Three times a day (TID) | ORAL | 1 refills | Status: DC

## 2020-09-02 NOTE — Telephone Encounter (Signed)
Please tell Oniel: --let's decrease Linzess to 72 mcg daily and see if stools are more consistent and less diarrhea --Low gas diet --Can continue with Phazyme  --I think the tumor is causing most of the bloating symptom. --Could try metoclopramide 5 mg TIDAC and HS given need for narcotics.  This can rarely cause neurologic side effects like tremor or muscle spasms.  If this occurs he should stop and let me know

## 2020-09-03 ENCOUNTER — Telehealth: Payer: Self-pay

## 2020-09-03 NOTE — Telephone Encounter (Signed)
Pt called and left message about possible Covid exposure. Pt verified he has no symptoms, his test was negative. Pt to monitor symptoms for any changes. Pt to keep next appointment as scheduled if he has no change from his current status.

## 2020-09-06 ENCOUNTER — Inpatient Hospital Stay: Payer: BC Managed Care – PPO

## 2020-09-06 ENCOUNTER — Ambulatory Visit: Payer: BC Managed Care – PPO | Admitting: Oncology

## 2020-09-06 ENCOUNTER — Other Ambulatory Visit: Payer: BC Managed Care – PPO

## 2020-09-06 ENCOUNTER — Other Ambulatory Visit: Payer: Self-pay

## 2020-09-06 ENCOUNTER — Inpatient Hospital Stay (HOSPITAL_BASED_OUTPATIENT_CLINIC_OR_DEPARTMENT_OTHER): Payer: BC Managed Care – PPO | Admitting: Medical

## 2020-09-06 VITALS — BP 150/82 | HR 88 | Temp 98.4°F | Resp 18

## 2020-09-06 DIAGNOSIS — C220 Liver cell carcinoma: Secondary | ICD-10-CM

## 2020-09-06 DIAGNOSIS — T8090XA Unspecified complication following infusion and therapeutic injection, initial encounter: Secondary | ICD-10-CM

## 2020-09-06 DIAGNOSIS — Z5112 Encounter for antineoplastic immunotherapy: Secondary | ICD-10-CM | POA: Diagnosis not present

## 2020-09-06 DIAGNOSIS — Z79899 Other long term (current) drug therapy: Secondary | ICD-10-CM | POA: Diagnosis not present

## 2020-09-06 LAB — CBC WITH DIFFERENTIAL (CANCER CENTER ONLY)
Abs Immature Granulocytes: 0.02 10*3/uL (ref 0.00–0.07)
Basophils Absolute: 0 10*3/uL (ref 0.0–0.1)
Basophils Relative: 0 %
Eosinophils Absolute: 0.1 10*3/uL (ref 0.0–0.5)
Eosinophils Relative: 1 %
HCT: 40.6 % (ref 39.0–52.0)
Hemoglobin: 12.6 g/dL — ABNORMAL LOW (ref 13.0–17.0)
Immature Granulocytes: 0 %
Lymphocytes Relative: 15 %
Lymphs Abs: 0.8 10*3/uL (ref 0.7–4.0)
MCH: 25.2 pg — ABNORMAL LOW (ref 26.0–34.0)
MCHC: 31 g/dL (ref 30.0–36.0)
MCV: 81.2 fL (ref 80.0–100.0)
Monocytes Absolute: 0.5 10*3/uL (ref 0.1–1.0)
Monocytes Relative: 10 %
Neutro Abs: 4.1 10*3/uL (ref 1.7–7.7)
Neutrophils Relative %: 74 %
Platelet Count: 160 10*3/uL (ref 150–400)
RBC: 5 MIL/uL (ref 4.22–5.81)
RDW: 14.6 % (ref 11.5–15.5)
WBC Count: 5.5 10*3/uL (ref 4.0–10.5)
nRBC: 0 % (ref 0.0–0.2)

## 2020-09-06 LAB — HEPATITIS B CORE ANTIBODY, TOTAL: Hep B Core Total Ab: NONREACTIVE

## 2020-09-06 LAB — CMP (CANCER CENTER ONLY)
ALT: 16 U/L (ref 0–44)
AST: 81 U/L — ABNORMAL HIGH (ref 15–41)
Albumin: 2.9 g/dL — ABNORMAL LOW (ref 3.5–5.0)
Alkaline Phosphatase: 116 U/L (ref 38–126)
Anion gap: 13 (ref 5–15)
BUN: 9 mg/dL (ref 6–20)
CO2: 22 mmol/L (ref 22–32)
Calcium: 8.7 mg/dL — ABNORMAL LOW (ref 8.9–10.3)
Chloride: 105 mmol/L (ref 98–111)
Creatinine: 0.82 mg/dL (ref 0.61–1.24)
GFR, Estimated: >60 mL/min (ref >60)
Glucose, Bld: 147 mg/dL — ABNORMAL HIGH (ref 70–99)
Potassium: 4 mmol/L (ref 3.5–5.1)
Sodium: 140 mmol/L (ref 135–145)
Total Bilirubin: 1 mg/dL (ref 0.3–1.2)
Total Protein: 6.2 g/dL — ABNORMAL LOW (ref 6.5–8.1)

## 2020-09-06 LAB — TSH: TSH: 0.879 u[IU]/mL (ref 0.320–4.118)

## 2020-09-06 LAB — HEPATITIS C ANTIBODY: HCV Ab: NONREACTIVE

## 2020-09-06 LAB — HEPATITIS B SURFACE ANTIGEN: Hepatitis B Surface Ag: NONREACTIVE

## 2020-09-06 MED ORDER — SODIUM CHLORIDE 0.9 % IV SOLN
Freq: Once | INTRAVENOUS | Status: AC
  Filled 2020-09-06: qty 250

## 2020-09-06 MED ORDER — SODIUM CHLORIDE 0.9 % IV SOLN
1200.0000 mg | Freq: Once | INTRAVENOUS | Status: AC
  Administered 2020-09-06: 1200 mg via INTRAVENOUS
  Filled 2020-09-06: qty 20

## 2020-09-06 MED ORDER — FAMOTIDINE IN NACL 20-0.9 MG/50ML-% IV SOLN
20.0000 mg | Freq: Once | INTRAVENOUS | Status: AC | PRN
  Administered 2020-09-06: 20 mg via INTRAVENOUS

## 2020-09-06 MED ORDER — DIPHENHYDRAMINE HCL 50 MG/ML IJ SOLN
50.0000 mg | Freq: Once | INTRAMUSCULAR | Status: AC | PRN
  Administered 2020-09-06: 25 mg via INTRAVENOUS

## 2020-09-06 MED ORDER — SODIUM CHLORIDE 0.9 % IV SOLN
15.0000 mg/kg | Freq: Once | INTRAVENOUS | Status: AC
  Administered 2020-09-06: 1300 mg via INTRAVENOUS
  Filled 2020-09-06: qty 48

## 2020-09-06 NOTE — Patient Instructions (Signed)
[facility] Discharge Instructions for Patients Receiving Chemotherapy  Today you received the following chemotherapy agents Tecentriq, Avastin   To help prevent nausea and vomiting after your treatment, we encourage you to take your nausea medication as directed by MD    If you develop nausea and vomiting that is not controlled by your nausea medication, call the clinic.   BELOW ARE SYMPTOMS THAT SHOULD BE REPORTED IMMEDIATELY:  *FEVER GREATER THAN 100.5 F  *CHILLS WITH OR WITHOUT FEVER  NAUSEA AND VOMITING THAT IS NOT CONTROLLED WITH YOUR NAUSEA MEDICATION  *UNUSUAL SHORTNESS OF BREATH  *UNUSUAL BRUISING OR BLEEDING  TENDERNESS IN MOUTH AND THROAT WITH OR WITHOUT PRESENCE OF ULCERS  *URINARY PROBLEMS  *BOWEL PROBLEMS  UNUSUAL RASH Items with * indicate a potential emergency and should be followed up as soon as possible.  Feel free to call the clinic should you have any questions or concerns. The clinic phone number is [phone].  Please show the [facility] at check-in to the Emergency Department and triage nurse.

## 2020-09-06 NOTE — Progress Notes (Signed)
At 1400 pt began to complain of pain in the lower back, sharp chest pain and right arm pain.  The infusion was stopped and emergency protocol was put in place.  See MAR. Sandi Mealy called to chair and ordered EKG.  Nurse ordered to resume infusion after emergency medications given and symptoms resolved.  The patient's pain resolved and the infusion was resumed with no more incidents.  Patient ambulated from facility in stable condition.

## 2020-09-07 ENCOUNTER — Other Ambulatory Visit: Payer: Self-pay

## 2020-09-07 ENCOUNTER — Encounter: Payer: Self-pay | Admitting: Oncology

## 2020-09-07 ENCOUNTER — Telehealth: Payer: Self-pay

## 2020-09-07 MED ORDER — LINACLOTIDE 72 MCG PO CAPS: 72.0000 ug | ORAL_CAPSULE | Freq: Every day | ORAL | 3 refills | Status: DC

## 2020-09-07 NOTE — Telephone Encounter (Signed)
Called pt in response to his message. Pt c/o increased hoarseness and sore throat. Pt was re-educated that his avastin  can cause hoarseness. Since pt was exposed to Covid, suggested he get tested especially if his symptoms worsen. Will also update Sherrill MD on pt's c/o of increased hoarseness.

## 2020-09-09 NOTE — Progress Notes (Signed)
    DATE:  09/06/2020                                          X  CHEMO/IMMUNOTHERAPY REACTION            MD:  Dr. Dominica Severin B. Sherrill   AGENT/BLOOD PRODUCT RECEIVING TODAY:              Tecentriq   AGENT/BLOOD PRODUCT RECEIVING IMMEDIATELY PRIOR TO REACTION:          Tecentriq   VS: BP:     151/94   P:       86       SPO2:       99% on room air                  REACTION(S):           chest pain, back pain and right shoulder pain   PREMEDS:        None   INTERVENTION: Tecentriq was paused.  The patient was given Benadryl 25 mg IV x1 and Pepcid 20 mg IV x1.  An EKG was completed which showed sinus rhythm with a short PR interval at 108 ms, nonspecific ST and T wave abnormality.  No ischemic changes were noted.  Review of Systems  Review of Systems  Constitutional: Negative for chills, diaphoresis and fever.  HENT: Positive for voice change (Hoarseness). Negative for trouble swallowing.   Respiratory: Negative for cough, chest tightness, shortness of breath and wheezing.   Cardiovascular: Positive for chest pain. Negative for palpitations.  Gastrointestinal: Negative for abdominal pain, constipation, diarrhea, nausea and vomiting.  Musculoskeletal: Positive for arthralgias and back pain. Negative for myalgias.  Neurological: Negative for dizziness, light-headedness and headaches.     Physical Exam  Physical Exam Constitutional:      General: He is not in acute distress.    Appearance: He is not diaphoretic.  HENT:     Head: Normocephalic and atraumatic.  Eyes:     General: No scleral icterus.       Right eye: No discharge.        Left eye: No discharge.     Conjunctiva/sclera: Conjunctivae normal.  Cardiovascular:     Rate and Rhythm: Normal rate and regular rhythm.     Heart sounds: Normal heart sounds. No murmur heard. No friction rub. No gallop.   Pulmonary:     Effort: Pulmonary effort is normal. No respiratory distress.     Breath sounds: Normal breath sounds. No  wheezing or rales.  Skin:    General: Skin is warm and dry.     Findings: No erythema or rash.  Neurological:     Mental Status: He is alert.     OUTCOME:                 The patient's symptoms abated and he was able to complete his dosing of Tecentriq without any additional issues of concern.   Sandi Mealy, MHS, PA-C

## 2020-09-10 LAB — HEMOCHROMATOSIS DNA-PCR(C282Y,H63D)

## 2020-09-12 ENCOUNTER — Encounter: Payer: Self-pay | Admitting: Oncology

## 2020-09-12 ENCOUNTER — Other Ambulatory Visit: Payer: Self-pay | Admitting: Internal Medicine

## 2020-09-13 ENCOUNTER — Telehealth: Payer: Self-pay

## 2020-09-13 DIAGNOSIS — C220 Liver cell carcinoma: Secondary | ICD-10-CM

## 2020-09-13 NOTE — Telephone Encounter (Signed)
Pt is requesting something for pain that he can take during the day. Pt states narcotic /Hydrocodone is okay at night but he cant take it during the day and work. Pain is in upper abd /sterum. Pt is also very hoarse and has very bad sore throat. Has had 3 negative covid tests but sore throat has gotten worse over the last few days. Pt also asking if ok to take/try CBD gummies, would that interfere with any of his medications. To discuss with Dr. Benay Spice and then provide next steps. Next pt appointment is 09/27/20.  Per Dr. Benay Spice: pt to take Ibuprofen 600 mg po every 6 h during the day as needed for pain. Referral made to [facility]/Dr. Constance Holster for throat discomfort and hoarseness. Pt contacted with information and verbalized understanding.  Referral order faxed to Dr. Merry Proud Rosen's office.

## 2020-09-16 ENCOUNTER — Other Ambulatory Visit: Payer: Self-pay

## 2020-09-16 DIAGNOSIS — R140 Abdominal distension (gaseous): Secondary | ICD-10-CM

## 2020-09-16 DIAGNOSIS — R109 Unspecified abdominal pain: Secondary | ICD-10-CM

## 2020-09-16 NOTE — Telephone Encounter (Signed)
Please let patient know I got his email Will swelling in abdomen, I am concerned this could be ascites.  If present this would relate to his cancer and would be outside the bowel lumen, but causing pressure and distention. Please schedule an abd [place] for him to search for ascites. 2v abd xray also JMP  I have copied Dr. Benay Spice. Leroy Sea, he has called several times lately and I have been trying to troubleshoot.  I had him on Linzess as a laxative and then when upper symptoms persisted I started Reglan.  The latter seemed to help the most and then I backed off the Lafe. Just wanted to keep you in the loop as I think he is struggling. Thanks Ulice Dash

## 2020-09-20 ENCOUNTER — Encounter: Payer: Self-pay | Admitting: Oncology

## 2020-09-20 ENCOUNTER — Other Ambulatory Visit: Payer: Self-pay | Admitting: *Deleted

## 2020-09-20 ENCOUNTER — Telehealth: Payer: Self-pay | Admitting: *Deleted

## 2020-09-20 DIAGNOSIS — C220 Liver cell carcinoma: Secondary | ICD-10-CM

## 2020-09-20 NOTE — Telephone Encounter (Signed)
Dr. Benay Spice has also suggested to obtain a CT chest.

## 2020-09-20 NOTE — Telephone Encounter (Signed)
Called Dr. Janeice Robinson office with [facility] and confirmed that 10/05/20 is the first available for new patient. Requested he be placed on "wait list" and this was done. Patient reporting significant pain in throat with some swallowing difficulty. Voice is still hoarse. Sent Mychart message back to ask him has he seen his PCP to rule out strep throat?

## 2020-09-20 NOTE — Progress Notes (Signed)
Orders placed for CT neck and chest. Notified managed care to begin PA process.

## 2020-09-21 ENCOUNTER — Other Ambulatory Visit: Payer: Self-pay

## 2020-09-21 ENCOUNTER — Telehealth (INDEPENDENT_AMBULATORY_CARE_PROVIDER_SITE_OTHER): Payer: BC Managed Care – PPO | Admitting: Internal Medicine

## 2020-09-21 ENCOUNTER — Telehealth: Payer: BC Managed Care – PPO | Admitting: Internal Medicine

## 2020-09-21 ENCOUNTER — Telehealth: Payer: Self-pay | Admitting: *Deleted

## 2020-09-21 ENCOUNTER — Encounter: Payer: Self-pay | Admitting: Internal Medicine

## 2020-09-21 VITALS — Ht 70.0 in | Wt 194.0 lb

## 2020-09-21 DIAGNOSIS — R490 Dysphonia: Secondary | ICD-10-CM | POA: Diagnosis not present

## 2020-09-21 DIAGNOSIS — R1319 Other dysphagia: Secondary | ICD-10-CM | POA: Diagnosis not present

## 2020-09-21 NOTE — Progress Notes (Unsigned)
Pre visit review using our clinic review tool, if applicable. No additional management support is needed unless otherwise documented below in the visit note. 

## 2020-09-21 NOTE — Progress Notes (Signed)
Subjective:    Patient ID: Robert Velazquez, male    DOB: [birth year], 55 y.o.   MRN: [id number]  DOS:  09/21/2020 Type of visit - description: Virtual Visit via Video Note  I connected with the above patient  by a video enabled telemedicine application and verified that I am speaking with the correct person using two identifiers.   THIS ENCOUNTER IS A VIRTUAL VISIT DUE TO COVID-19 - PATIENT WAS NOT SEEN IN THE OFFICE. PATIENT HAS CONSENTED TO VIRTUAL VISIT / TELEMEDICINE VISIT   Location of patient: home  Location of provider: office  Persons participating in the virtual visit: patient, provider   I discussed the limitations of evaluation and management by telemedicine and the availability of in person appointments. The patient expressed understanding and agreed to proceed.  Acute The patient was recently diagnosed with liver cancer. He is calling today because he is having dysphonia since early January when he got his first chemotherapy. He has also developed some sore throat in the last few days.  He is concerned about sxs being strep throat, had subjective fevers on and off since the onset of symptoms.  Denies any drooling His neck is not swollen when he looks in the mirror. He does not know if his throat is red or if he has white patches. He has some difficulty swallowing but has not choked. He is trying to eat soft foods.  He is trying to push fluids.  Denies cough Has some nausea but no vomiting. No change in the color of the stools  Review of Systems See above   Past Medical History:  Diagnosis Date  . Annual physical exam 11/20/2014  . Arthritis    rheumatoid arthritis variant; Dr Jolee Ewing  . Chronic nasal congestion 09/24/2015  . Diabetes mellitus without complication ([place]) 6/94/8546  . Essential hypertension 08/18/2010   Qualifier: Diagnosis of  By: Linna Darner MD, Gwyndolyn Saxon    . Family history of early CAD 11/20/2014  . GERD (gastroesophageal reflux disease)   . 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 Globus sensation 12/22/2015  . Hemangioma   . Hepatocellular carcinoma ([place]) 07/2020  . History of chicken pox   . Hyperlipidemia    NMR LDL 140(1854/1324)TG 102,HDL 33  . Hypertension   . Rheumatoid arthritis ([place]) 02/09/2010   Qualifier: Diagnosis of  By: Inda Castle FNP, [place]  Dr Charlestine Night, Rheumatology      Past Surgical History:  Procedure Laterality Date  . COLONOSCOPY    . EPIDURAL BLOCK INJECTION  2011-2014   ? 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 KNEE SURGERY     arthoscopic x2  . MOUTH SURGERY     wisdom teeth & tooth removed from palate & re-implated  . TONSILLECTOMY      Allergies as of 09/21/2020   No Known Allergies     Medication List       Accurate as of September 21, 2020  4:09 PM. If you have any questions, ask your nurse or doctor.        acetaminophen 325 MG tablet Commonly known as: TYLENOL Take 650 mg by mouth as needed. Reported on 11/29/2015   carvedilol 25 MG tablet Commonly known as: COREG Take 1 tablet (25 mg total) by mouth 2 (two) times daily with a meal.   HYDROcodone-acetaminophen 5-325 MG tablet Commonly known as: NORCO/VICODIN Take 1-2 tablets by mouth every 6 (six) hours as  needed.   hyoscyamine 0.125 MG SL tablet Commonly known as: LEVSIN SL Place 1 tablet (0.125 mg total) under the tongue 3 (three) times daily as needed for cramping.   linaclotide 145 MCG Caps capsule Commonly known as: Linzess Take 1 capsule (145 mcg total) by mouth daily before breakfast.   linaclotide 72 MCG capsule Commonly known as: LINZESS Take 1 capsule (72 mcg total) by mouth daily before breakfast.   LORazepam 0.5 MG tablet Commonly known as: ATIVAN Take 1-2 tablets (0.5-1 mg total) by mouth at bedtime as needed for sleep.   losartan 100 MG tablet Commonly known as: COZAAR Take 1 tablet (100 mg total) by mouth daily.   metFORMIN 1000 MG tablet Commonly known as:  GLUCOPHAGE Take 1 tablet (1,000 mg total) by mouth 2 (two) times daily with a meal.   metoCLOPramide 5 MG tablet Commonly known as: Reglan Take 1 tablet (5 mg total) by mouth 4 (four) times daily -  before meals and at bedtime.   multivitamin tablet Take 1 tablet by mouth daily.   pantoprazole 40 MG tablet Commonly known as: PROTONIX TAKE 1 TABLET BY MOUTH 2 TIMES DAILY BEFORE A MEAL   rosuvastatin 5 MG tablet Commonly known as: CRESTOR Take 1 tablet (5 mg total) by mouth daily.          Objective:   Physical Exam Ht 5\' 10"  (1.778 m)   Wt 194 lb (88 kg)   BMI 27.84 kg/m  This is a virtual video visit, obvious dysphonia, no drooling, no trismus, drinking water frequently while in the call. No vital signs available. The patient is certainly emotional.    Assessment     Assessment (transfer to me 11-2016) Diabetes (prediabetic since 2017)  HTN (amlodipine= edema noted 2019) Hyperlipidemia GERD R.A. Dx ~ mid 53s + FH CAD  PLAN: Liver cancer: Since the last OV was dx w/ liver cancer, under the care onc, started chemotherapy the first week of January. The patient is obviously quite sad about this issue, I provide as much support as I could.  He knows to call me if at some point he needs something for anxiety.  Reports that he is getting a lot of support from family and coworkers, he is actually still working from home, he thinks keeping his mind occupied is beneficial for him. Dysphonia, difficulty swallowing, sore throat: Probably related to the primary process, liver cancer, oncology has already order CT chest, CT neck, and refer him to ENT. Recommend to continue precautions to prevent choking or aspiration. Request strep test, advised that it is unlikely that this what is causing his sxs but he is coming to this building in 2 days so he will come back by the office to get swab HTN: Advised that at some point if he is unable to drink fluids he will need to hold off on  some of his BP meds, recent blood pressure 137/92.      I discussed the assessment and treatment plan with the patient. The patient was provided an opportunity to ask questions and all were answered. The patient agreed with the plan and demonstrated an understanding of the instructions.   The patient was advised to call back or seek an in-person evaluation if the symptoms worsen or if the condition fails to improve as anticipated.

## 2020-09-21 NOTE — Telephone Encounter (Signed)
Wife trying to file a insurance claim and needs copy of his pathology report confirming diagnosis and TNM staging. Made copy of path with note that TNM is not done with biopsy--only w/surgical resection and this type of staging is not done with liver cancer. Report at front desk for pick up.

## 2020-09-22 NOTE — Progress Notes (Signed)
Patient with infusion event for cycle #1 received additional medication.  Spoke with Dr Benay Spice and he feels not infusion issue so does not want to add additional medications at this time.  T.O. Dr March Rummage Aurora Mask, PharmD 09/22/20 @ 1100

## 2020-09-22 NOTE — Assessment & Plan Note (Signed)
Liver cancer: Since the last OV was dx w/ liver cancer, under the care onc, started chemotherapy the first week of January. The patient is obviously quite sad about this issue, I provide as much support as I could.  He knows to call me if at some point he needs something for anxiety.  Reports that he is getting a lot of support from family and coworkers, he is actually still working from home, he thinks keeping his mind occupied is beneficial for him. Dysphonia, difficulty swallowing, sore throat: Probably related to the primary process, liver cancer, oncology has already order CT chest, CT neck, and refer him to ENT. Recommend to continue precautions to prevent choking or aspiration. Request strep test, advised that it is unlikely that this what is causing his sxs but he is coming to this building in 2 days so he will come back by the office to get swab HTN: Advised that at some point if he is unable to drink fluids he will need to hold off on some of his BP meds, recent blood pressure 137/92.

## 2020-09-23 ENCOUNTER — Ambulatory Visit (HOSPITAL_COMMUNITY)
Admission: RE | Admit: 2020-09-23 | Discharge: 2020-09-23 | Disposition: A | Discharge status: Home / Self Care | Payer: BC Managed Care – PPO | Source: Ambulatory Visit | Attending: Internal Medicine | Admitting: Internal Medicine

## 2020-09-23 ENCOUNTER — Other Ambulatory Visit: Payer: Self-pay

## 2020-09-23 DIAGNOSIS — R109 Unspecified abdominal pain: Secondary | ICD-10-CM

## 2020-09-23 DIAGNOSIS — R140 Abdominal distension (gaseous): Secondary | ICD-10-CM

## 2020-09-23 DIAGNOSIS — K8020 Calculus of gallbladder without cholecystitis without obstruction: Secondary | ICD-10-CM | POA: Diagnosis not present

## 2020-09-24 ENCOUNTER — Ambulatory Visit (INDEPENDENT_AMBULATORY_CARE_PROVIDER_SITE_OTHER): Payer: BC Managed Care – PPO | Admitting: Internal Medicine

## 2020-09-24 ENCOUNTER — Ambulatory Visit (HOSPITAL_BASED_OUTPATIENT_CLINIC_OR_DEPARTMENT_OTHER)
Admission: RE | Admit: 2020-09-24 | Discharge: 2020-09-24 | Disposition: A | Discharge status: Home / Self Care | Payer: BC Managed Care – PPO | Source: Ambulatory Visit | Attending: Oncology | Admitting: Oncology

## 2020-09-24 DIAGNOSIS — J90 Pleural effusion, not elsewhere classified: Secondary | ICD-10-CM | POA: Diagnosis not present

## 2020-09-24 DIAGNOSIS — C220 Liver cell carcinoma: Secondary | ICD-10-CM | POA: Diagnosis not present

## 2020-09-24 DIAGNOSIS — K118 Other diseases of salivary glands: Secondary | ICD-10-CM | POA: Diagnosis not present

## 2020-09-24 DIAGNOSIS — J029 Acute pharyngitis, unspecified: Secondary | ICD-10-CM

## 2020-09-24 DIAGNOSIS — Z8505 Personal history of malignant neoplasm of liver: Secondary | ICD-10-CM | POA: Diagnosis not present

## 2020-09-24 DIAGNOSIS — R490 Dysphonia: Secondary | ICD-10-CM | POA: Diagnosis not present

## 2020-09-24 DIAGNOSIS — R911 Solitary pulmonary nodule: Secondary | ICD-10-CM | POA: Diagnosis not present

## 2020-09-24 LAB — POCT RAPID STREP A (OFFICE): Rapid Strep A Screen: NEGATIVE

## 2020-09-24 MED ORDER — IOHEXOL 300 MG/ML  SOLN
100.0000 mL | Freq: Once | INTRAMUSCULAR | Status: AC | PRN
  Administered 2020-09-24: 80 mL via INTRAVENOUS

## 2020-09-24 NOTE — Progress Notes (Signed)
Pt here today for poct strep testing. See virtual visit note with Dr. Larose Kells- 09/21/2020.   POCT strep- negative 09/24/2020 2:03pm

## 2020-09-25 ENCOUNTER — Other Ambulatory Visit: Payer: Self-pay | Admitting: Internal Medicine

## 2020-09-25 MED ORDER — APIXABAN 5 MG PO TABS: 5.0000 mg | ORAL_TABLET | Freq: Two times a day (BID) | ORAL | 0 refills | Status: DC

## 2020-09-25 NOTE — Progress Notes (Signed)
I received a call from radiology regarding result of CT scan of the chest that showed the development of pulmonary embolism in the left pulmonary artery with some right ventricular strain. I called the patient who is feeling fine except for mild tightness in his chest. I gave him the option of going to the emergency department for evaluation and restarting of anticoagulation versus starting anticoagulation at home with Eliquis 5 mg p.o. twice daily. The patient was not interested in going to the hospital and he will start his treatment with Eliquis tonight. I provided the patient with up-to-date patient education handout about pulmonary embolism as well as Eliquis. He will follow up with Dr. Benay Spice as previously planned. He was also advised to call if he has any concerning issues.

## 2020-09-26 ENCOUNTER — Other Ambulatory Visit: Payer: Self-pay | Admitting: Oncology

## 2020-09-27 ENCOUNTER — Inpatient Hospital Stay: Payer: BC Managed Care – PPO | Attending: Oncology

## 2020-09-27 ENCOUNTER — Other Ambulatory Visit: Payer: Self-pay

## 2020-09-27 ENCOUNTER — Inpatient Hospital Stay (HOSPITAL_BASED_OUTPATIENT_CLINIC_OR_DEPARTMENT_OTHER): Payer: BC Managed Care – PPO | Admitting: Oncology

## 2020-09-27 ENCOUNTER — Other Ambulatory Visit: Payer: Self-pay | Admitting: *Deleted

## 2020-09-27 ENCOUNTER — Other Ambulatory Visit: Payer: Self-pay | Admitting: Hematology

## 2020-09-27 ENCOUNTER — Encounter: Payer: Self-pay | Admitting: *Deleted

## 2020-09-27 ENCOUNTER — Inpatient Hospital Stay: Payer: BC Managed Care – PPO

## 2020-09-27 VITALS — BP 95/76 | HR 69 | Temp 98.9°F | Resp 19 | Ht 70.0 in | Wt 201.8 lb

## 2020-09-27 DIAGNOSIS — C220 Liver cell carcinoma: Secondary | ICD-10-CM

## 2020-09-27 DIAGNOSIS — E119 Type 2 diabetes mellitus without complications: Secondary | ICD-10-CM | POA: Diagnosis not present

## 2020-09-27 DIAGNOSIS — J90 Pleural effusion, not elsewhere classified: Secondary | ICD-10-CM | POA: Diagnosis not present

## 2020-09-27 DIAGNOSIS — I2699 Other pulmonary embolism without acute cor pulmonale: Secondary | ICD-10-CM | POA: Insufficient documentation

## 2020-09-27 DIAGNOSIS — I10 Essential (primary) hypertension: Secondary | ICD-10-CM | POA: Insufficient documentation

## 2020-09-27 DIAGNOSIS — R918 Other nonspecific abnormal finding of lung field: Secondary | ICD-10-CM | POA: Insufficient documentation

## 2020-09-27 LAB — CMP (CANCER CENTER ONLY)
ALT: 105 U/L — ABNORMAL HIGH (ref 0–44)
AST: 189 U/L (ref 15–41)
Albumin: 2.4 g/dL — ABNORMAL LOW (ref 3.5–5.0)
Alkaline Phosphatase: 992 U/L — ABNORMAL HIGH (ref 38–126)
Anion gap: 13 (ref 5–15)
BUN: 31 mg/dL — ABNORMAL HIGH (ref 6–20)
CO2: 21 mmol/L — ABNORMAL LOW (ref 22–32)
Calcium: 8.7 mg/dL — ABNORMAL LOW (ref 8.9–10.3)
Chloride: 101 mmol/L (ref 98–111)
Creatinine: 0.94 mg/dL (ref 0.61–1.24)
GFR, Estimated: >60 mL/min (ref >60)
Glucose, Bld: 121 mg/dL — ABNORMAL HIGH (ref 70–99)
Potassium: 6.1 mmol/L — ABNORMAL HIGH (ref 3.5–5.1)
Sodium: 135 mmol/L (ref 135–145)
Total Bilirubin: 7.6 mg/dL (ref 0.3–1.2)
Total Protein: 5.9 g/dL — ABNORMAL LOW (ref 6.5–8.1)

## 2020-09-27 MED ORDER — OXYCODONE HCL 5 MG PO TABS: 5.0000 mg | ORAL_TABLET | ORAL | 0 refills | Status: DC | PRN

## 2020-09-27 MED ORDER — SODIUM POLYSTYRENE SULFONATE 15 GM/60ML PO SUSP: 15.0000 g | Freq: Three times a day (TID) | ORAL | 0 refills | Status: AC

## 2020-09-27 MED ORDER — SODIUM POLYSTYRENE SULFONATE 15 GM/60ML PO SUSP: 15.0000 g | Freq: Three times a day (TID) | ORAL | 0 refills | Status: DC

## 2020-09-27 NOTE — Progress Notes (Signed)
K+ 6.1 today. Several attempts to call his pharmacy to speak w/pharmacist to determine if they carry Kayexelate. Finally had to leave new script on voice mail and request return call asap if they do not carry it.

## 2020-09-27 NOTE — Progress Notes (Signed)
CRITICAL VALUE STICKER  CRITICAL VALUE: Total bili=7.6 and AST 189  RECEIVER (on-site recipient of call): Braylie Badami,RN  DATE & TIME NOTIFIED: 09/27/20 at 1155  MESSENGER (representative from lab): Threasa Beards, RN  MD NOTIFIED: Dr. Benay Spice  TIME OF NOTIFICATION: 2574  RESPONSE: No new orders. Patient being referred to Hospice care.

## 2020-09-27 NOTE — Progress Notes (Signed)
Faxed referral orders, demographics and chart information to [facility] [phone] w/confirmation received. Patient had also stated he wishes to be a DNR>

## 2020-09-27 NOTE — Progress Notes (Signed)
Kayexalate sent to [place] [facility] per patients request.Not available @ [place].

## 2020-09-27 NOTE — Progress Notes (Signed)
[facility] OFFICE PROGRESS NOTE   Diagnosis: Hepatocellular carcinoma  INTERVAL HISTORY:   Mr. Creekmore returns as scheduled.  He completed another cycle of systemic therapy on 09/06/2020.  Mr. Tanimoto has multiple complaints.  He continues to have hoarseness, a sore throat, solid dysphagia, and abdominal pain.  He has a poor appetite.  He reports dyspnea for the past 2-3 weeks.  No orthopnea.  The abdominal pain is not relieved with hydrocodone.  He began apixaban anticoagulation on 09/25/2020 after a chest CT confirmed a left pulmonary embolism.  Objective:  Vital signs in last 24 hours:  Blood pressure 95/76, pulse 69, temperature 98.9 F (37.2 C), temperature source Tympanic, resp. rate 19, height 5\' 10"  (1.778 m), weight 201 lb 12.8 oz (91.5 kg), SpO2 100 %.    HEENT: Pharynx without erythema or exudate, no thrush Resp: Decreased breath sounds at the lower posterior chest bilaterally, no respiratory distress Cardio: Regular rate and rhythm, the neck veins are not distended GI: The liver edge is palpable throughout the upper abdomen extending across the midline with associated tenderness, the abdomen is distended Vascular: Pitting edema to lower leg bilaterally Neuro: Alert and oriented  Portacath/PICC-without erythema  Lab Results:  Lab Results  Component Value Date   WBC 5.5 09/06/2020   HGB 12.6 (L) 09/06/2020   HCT 40.6 09/06/2020   MCV 81.2 09/06/2020   PLT 160 09/06/2020   NEUTROABS 4.1 09/06/2020    CMP  Lab Results  Component Value Date   NA 135 09/27/2020   K 6.1 (H) 09/27/2020   CL 101 09/27/2020   CO2 21 (L) 09/27/2020   GLUCOSE 121 (H) 09/27/2020   BUN 31 (H) 09/27/2020   CREATININE 0.94 09/27/2020   CALCIUM 8.7 (L) 09/27/2020   PROT 5.9 (L) 09/27/2020   ALBUMIN 2.4 (L) 09/27/2020   AST 189 (HH) 09/27/2020   ALT 105 (H) 09/27/2020   ALKPHOS 992 (H) 09/27/2020   BILITOT 7.6 (HH) 09/27/2020   GFRNONAA >60 09/27/2020   GFRAA >60  11/14/2018    No results found for: CEA1  Lab Results  Component Value Date   INR 1.1 07/27/2020    Imaging:  CT Soft Tissue Neck W Contrast  Result Date: 09/24/2020 CLINICAL DATA:  Sore throat. Hoarseness. History of hepatocellular carcinoma. EXAM: CT NECK WITH CONTRAST TECHNIQUE: Multidetector CT imaging of the neck was performed using the standard protocol following the bolus administration of intravenous contrast. CONTRAST:  54mL OMNIPAQUE IOHEXOL 300 MG/ML  SOLN COMPARISON:  None. FINDINGS: Pharynx and larynx: Mild motion artifact through the pharynx and larynx without evidence of a mass or swelling. Patent airway. No fluid collection or inflammatory changes in the parapharyngeal or retropharyngeal spaces. Salivary glands: 6 mm nodule superficially in the left parotid gland, possibly a lymph node. Unremarkable appearance of the right parotid and both submandibular glands. Thyroid: Unremarkable. Lymph nodes: No enlarged or suspicious lymph nodes in the neck. Vascular: Major vascular structures of the neck are grossly patent. Limited intracranial: Unremarkable. Visualized orbits: Unremarkable. Mastoids and visualized paranasal sinuses: Mild right maxillary sinus mucosal thickening. Clear mastoid air cells. Skeleton: No acute osseous abnormality or suspicious osseous lesion. Upper chest: Reported separately. Other: None. IMPRESSION: No acute abnormality or evidence of metastatic disease in the neck. Electronically Signed   By: Logan Bores M.D.   On: 09/24/2020 15:35   CT Chest W Contrast  Addendum Date: 09/25/2020   ADDENDUM REPORT: 09/25/2020 17:37 ADDENDUM: The original report was by Dr. Van Clines.  The following addendum is by Dr. Van Clines: Critical Value/emergent results were called by telephone at the time of interpretation on 09/25/2020 at 5:36 pm to provider Dr. Julien Nordmann, who verbally acknowledged these results. Electronically Signed   By: Van Clines M.D.   On:  09/25/2020 17:37   Result Date: 09/25/2020 CLINICAL DATA:  Metastatic hepatocellular carcinoma EXAM: CT CHEST WITH CONTRAST TECHNIQUE: Multidetector CT imaging of the chest was performed during intravenous contrast administration. CONTRAST:  5mL OMNIPAQUE IOHEXOL 300 MG/ML  SOLN COMPARISON:  Multiple exams, including MRI abdomen from 07/22/2020 FINDINGS: Cardiovascular: Today's exam was not performed as a CT angiogram, but there is clearly considerable pulmonary embolus in the left pulmonary artery and extending into upper and lower lobar branches of the pulmonary artery. Right ventricular to left ventricular ratio is 0.98 which may indicate right heart strain. Left anterior descending coronary artery atherosclerosis. New moderate-sized pericardial effusion. Mediastinum/Nodes: Unremarkable Lungs/Pleura: Scattered bilateral sharply defined pulmonary nodules favoring metastatic disease in this setting. Index left lower lobe nodule 1.9 by 1.7 cm, image 75 series 6. Index right upper lobe pulmonary nodule 1.9 by 1.7 cm on image 68 series 6. Small left pleural effusion. There is atelectasis in both lower lobes. Upper Abdomen: Very large heterogeneously enhancing mass encompassing the entire left hepatic lobe and possibly invading into the right hepatic lobe. On the bottom most image this measures about 22.6 by 14.2 cm. Probable splenomegaly given the prominence of the upper portion of the spleen. Perihepatic and perisplenic ascites. Musculoskeletal: Unremarkable IMPRESSION: 1. Left main pulmonary artery thrombus extending into the upper and lower lobar pulmonary arteries. Positive for acute PE with CTevidence of right heart strain (RV/LV Ratio = 0.98) consistent with at least submassive (intermediate risk) PE. The presence of right heart strain has been associated with an increased risk of morbidity and mortality. 2. New moderate-sized pericardial effusion. Correlate with any clinical signs of tamponade. 3. Scattered  bilateral pulmonary nodules measuring up to 1.9 cm in diameter, compatible with metastatic lesions. 4. Very large heterogeneously enhancing mass encompassing the entire left hepatic lobe and possibly invading into the right hepatic lobe, compatible with known hepatocellular carcinoma. 5. Perihepatic and perisplenic ascites. 6. New small left pleural effusion. 7. Coronary atherosclerosis. 8. Probable splenomegaly. Radiology assistant personnel have been notified to put me in telephone contact with the referring physician or the referring physician's clinical representative in order to discuss these findings. Once this communication is established I will issue an addendum to this report for documentation purposes. Electronically Signed: By: Van Clines M.D. On: 09/25/2020 17:26    Medications: I have reviewed the patient's current medications.   Assessment/Plan: 1. Left liver mass ? Abdominal ultrasound 07/16/2020-possible left hepatic mass, cholelithiasis, splenomegaly, nonobstructing left renal calculus ? MRI abdomen 07/22/2020-15.8 x 10.5 x 10.6 cm left liver mass, extension into the left portal vein at the level of the confluence of the left and right portal vein, enhancing lesion in segment 5 concerning for a focus of malignancy, benign cyst in the posterior right liver, normal spleen ? Dr. Fayrene Helper 08/02/2020-tumor inoperable.  Consider Y 90 and/or systemic therapy ? Biopsy liver lesion 08/03/2020-hepatocellular carcinoma ? Cycle 1 atezolizumab/bevacizumab 08/16/2020 ? Cycle 2 atezolizumab/bevacizumab 09/06/2020 ? CT neck and chest 09/24/2020-left main pulmonary artery thrombus extending into upper and lower lobe pulmonary arteries, new moderate-sized pericardial effusion, bilateral lung nodules, enhancing mass encompassing the entire left liver with possible invasion of the right liver, perihepatic and perisplenic ascites, new small left pleural effusion.  No  evidence of metastatic disease to the  neck 2. Diabetes 3. Hyperlipidemia 4. Upper abdominal pain secondary to #1 5. Intentional weight loss 6. Hypertension 7. Rheumatoid nephritis-off of methotrexate  8. Pulmonary embolism on chest CT 09/24/2020-apixaban started 09/25/2020 9. Pain secondary to hepatocellular carcinoma    Disposition: Mr. Horrigan has advanced stage hepatocellular carcinoma.  There is clinical and radiologic evidence of disease progression.  He has a poor performance status.  I reviewed the CT images and discussed treatment options with Mr. Brownlow.  I recommend hospice care.  He is in agreement.  We discussed CPR and ACLS.  He will be placed on a no CODE BLUE status.  He began apixaban anticoagulation on 09/25/2020 after being diagnosed with a left pulmonary embolism.  He will continue apixaban.  He does not appear symptomatic from the pericardial effusion.  He does not have clinical evidence of pericardial tamponade.  We consolidated his medical regimen for comfort.  He will discontinue Metformin.  He will contact Dr. Larose Kells if his blood sugar is running greater than 300.  He will ask his wife to contact us if she has questions.  He is scheduled for an ENT evaluation for dysphagia and hoarseness next week.  He will return for an office visit on 10/06/2020.  We prescribed  oxycodone to use as needed for pain.  Betsy Coder, MD  09/27/2020  12:30 PM

## 2020-09-28 ENCOUNTER — Telehealth: Payer: Self-pay | Admitting: Oncology

## 2020-09-28 ENCOUNTER — Telehealth: Payer: Self-pay | Admitting: Internal Medicine

## 2020-09-28 ENCOUNTER — Encounter: Payer: Self-pay | Admitting: Oncology

## 2020-09-28 LAB — AFP TUMOR MARKER: AFP, Serum, Tumor Marker: 34455 ng/mL — ABNORMAL HIGH (ref 0.0–8.3)

## 2020-09-28 NOTE — Telephone Encounter (Signed)
Caller: Caryl Pina [facility]) Call back # [phone]  Verbal Order  For hospice Services

## 2020-09-28 NOTE — Telephone Encounter (Signed)
Orders received from [facility]. Form signed and faxed back to [phone]. Form sent for scanning.

## 2020-09-28 NOTE — Telephone Encounter (Signed)
Scheduled appointments per 2/14 los. Spoke to patient who is aware of appointment date and time.

## 2020-09-28 NOTE — Telephone Encounter (Signed)
Spoke w/ Caryl Pina- PCP will be attending.

## 2020-10-01 DIAGNOSIS — C220 Liver cell carcinoma: Secondary | ICD-10-CM | POA: Diagnosis not present

## 2020-10-02 DIAGNOSIS — C220 Liver cell carcinoma: Secondary | ICD-10-CM | POA: Diagnosis not present

## 2020-10-03 DIAGNOSIS — C220 Liver cell carcinoma: Secondary | ICD-10-CM | POA: Diagnosis not present

## 2020-10-04 DIAGNOSIS — C220 Liver cell carcinoma: Secondary | ICD-10-CM | POA: Diagnosis not present

## 2020-10-05 ENCOUNTER — Other Ambulatory Visit: Payer: Self-pay

## 2020-10-05 DIAGNOSIS — C220 Liver cell carcinoma: Secondary | ICD-10-CM | POA: Diagnosis not present

## 2020-10-06 ENCOUNTER — Ambulatory Visit: Payer: BC Managed Care – PPO | Admitting: Nurse Practitioner

## 2020-10-06 ENCOUNTER — Telehealth: Payer: Self-pay

## 2020-10-06 DIAGNOSIS — C220 Liver cell carcinoma: Secondary | ICD-10-CM | POA: Diagnosis not present

## 2020-10-06 NOTE — Telephone Encounter (Signed)
Plan of care received, signed by Dr. Larose Kells, faxed back to [facility] and received fax confirmation. -JMA

## 2020-10-07 DIAGNOSIS — C220 Liver cell carcinoma: Secondary | ICD-10-CM | POA: Diagnosis not present

## 2020-10-08 DIAGNOSIS — C220 Liver cell carcinoma: Secondary | ICD-10-CM | POA: Diagnosis not present

## 2020-10-09 DIAGNOSIS — C220 Liver cell carcinoma: Secondary | ICD-10-CM | POA: Diagnosis not present

## 2020-10-10 DIAGNOSIS — C220 Liver cell carcinoma: Secondary | ICD-10-CM | POA: Diagnosis not present

## 2020-10-11 DIAGNOSIS — C220 Liver cell carcinoma: Secondary | ICD-10-CM | POA: Diagnosis not present

## 2020-10-12 ENCOUNTER — Encounter: Payer: Self-pay | Admitting: Oncology

## 2020-10-12 ENCOUNTER — Telehealth: Payer: Self-pay | Admitting: Internal Medicine

## 2020-10-12 NOTE — Telephone Encounter (Signed)
I spoke with Santiago Glad, Jahaziel's wife, she is my patient to. I expressed my solidarity and condolences. She knows she can get some counseling from hospice or reach out to this office if she needs help.

## 2020-10-12 NOTE — Telephone Encounter (Signed)
Caller: Verline Lema [facility]) Call back # [phone]  FYI  Patient expired on 10-Nov-2020 at 05:53pm.

## 2020-10-12 DEATH — deceased

## 2020-10-13 ENCOUNTER — Telehealth: Payer: Self-pay

## 2020-10-13 NOTE — Telephone Encounter (Signed)
Patient's wife Santiago Glad had requested Dr. Benay Spice draft a letter regarding his cancer for his insurance company.  I mailed this to her today.

## 2020-10-13 NOTE — Telephone Encounter (Signed)
Izora Gala from [facility] called in reference to patient's death certificate, it is ready for Dr Larose Kells in [facility] system.

## 2020-10-13 NOTE — Telephone Encounter (Signed)
Completed death certificate online

## 2020-10-18 ENCOUNTER — Telehealth: Payer: Self-pay | Admitting: Internal Medicine

## 2020-10-18 NOTE — Telephone Encounter (Signed)
Done, please ask them to confirm

## 2020-10-18 NOTE — Telephone Encounter (Signed)
Caller: Ferrel Logan from [place]: [phone]   Dr.Paz, patient Robert Velazquez DOB [birth year] is decease you forgot to click the date to sign the death certificate is incomplete Izora Gala  From [employer] would like  for you to complete the certificated by adding the date send it back

## 2021-05-31 ENCOUNTER — Other Ambulatory Visit (HOSPITAL_COMMUNITY): Payer: Self-pay

## 2021-10-20 ENCOUNTER — Other Ambulatory Visit (HOSPITAL_BASED_OUTPATIENT_CLINIC_OR_DEPARTMENT_OTHER): Payer: Self-pay

## 2022-11-24 IMAGING — MR MR ABDOMEN WO/W CM
11 of 17 series · 25 of 48 positions shown · IV contrast (multihance)
Comparison: Ultrasound 07/16/2020

CLINICAL DATA: Liver mass identified on ultrasound. MRI recommended
for further characterization. Ultrasound obtained for epigastric
pain.

EXAM:
MRI ABDOMEN WITHOUT AND WITH CONTRAST
TECHNIQUE: Multiplanar multisequence MR imaging of the abdomen was performed
both before and after the administration of intravenous contrast.
CONTRAST:  19mL MULTIHANCE GADOBENATE DIMEGLUMINE 529 MG/ML IV SOLN

[Series 2: cor haste · coronal · 5.0mm · 0.74mm/px · 1 of 30 slices shown]
[im 1/30]
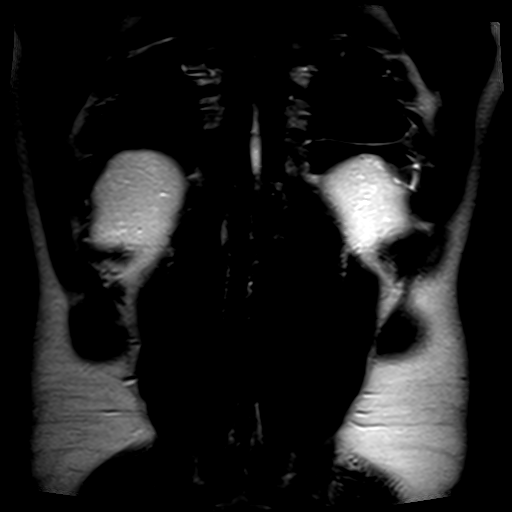

[Series 3: axial haste · axial · 6.0mm · 0.74mm/px · 1 of 37 slices shown]
[im 1/37]
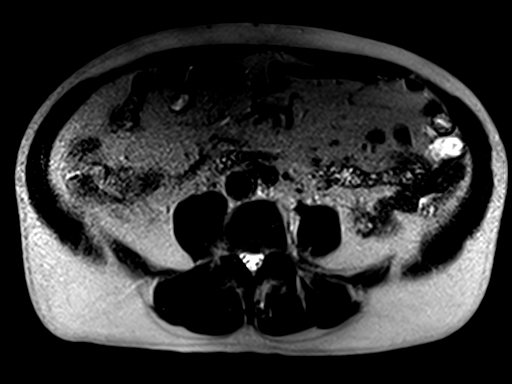

[Series 4: T1 · axial · 6.0mm · 0.74mm/px · z∈[-107,+130]mm · 2 of 74 slices shown]
[im 1/74]
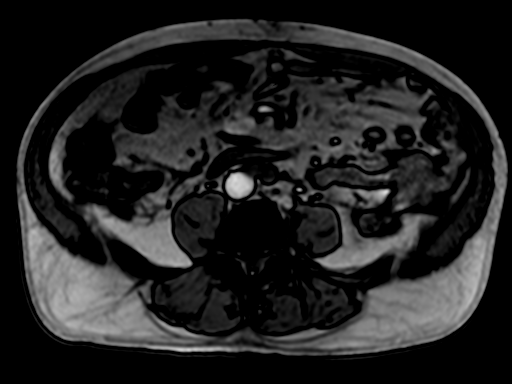
[im 74/74]
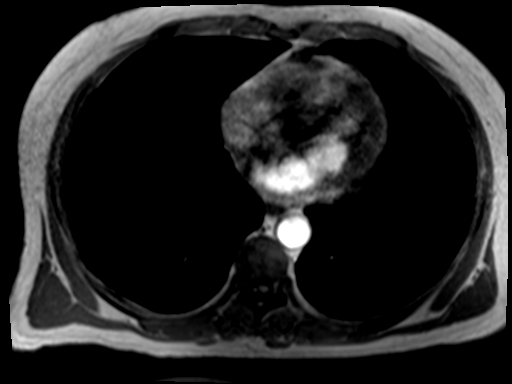

[Series 5: bSSFP · axial · 4.0mm · 0.74mm/px · 1 of 61 slices shown]
[im 1/61]
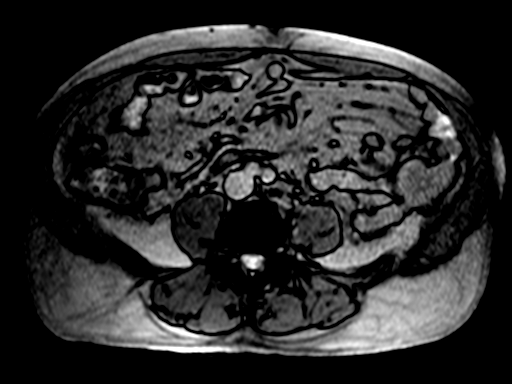

[Series 6: T2 · axial · 6.0mm · 1.19mm/px · 1 of 37 slices shown]
[im 1/37]
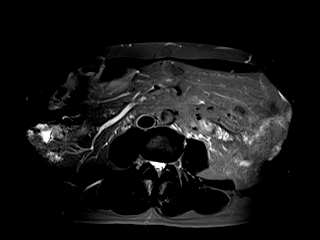

[Series 7: ep2d_[id number]_500_[id number]_trig · axial · 6.0mm · 1.98mm/px · z∈[-84,+175]mm · 3 of 109 slices shown]
[im 1/109]
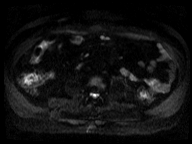
[im 55/109]
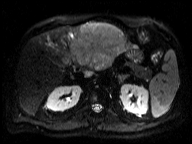
[im 109/109]
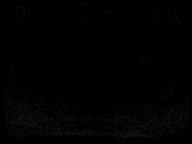

[Series 8: [id number]_800_p2_trig_adc · axial · 6.0mm · 1.98mm/px · 1 of 37 slices shown]
[im 1/37]
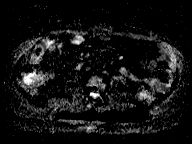

[Series 9: T1 dynamic · axial · non-contrast · 2.5mm · 0.74mm/px · z∈[-117,+140]mm · 3 of 104 slices shown]
[im 1/104]
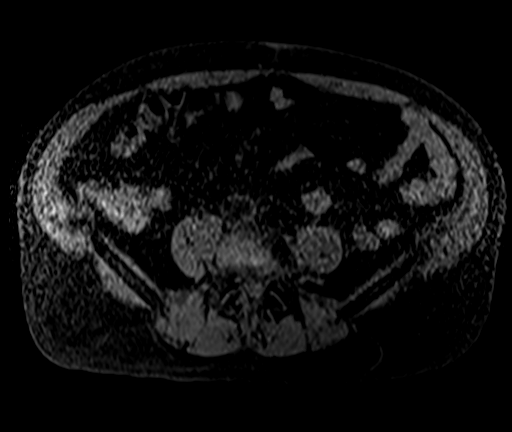
[im 52/104]
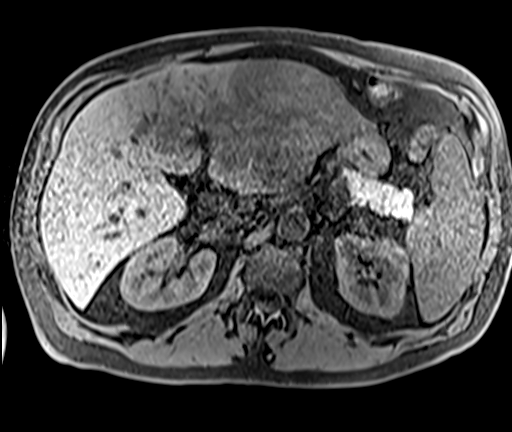
[im 104/104]
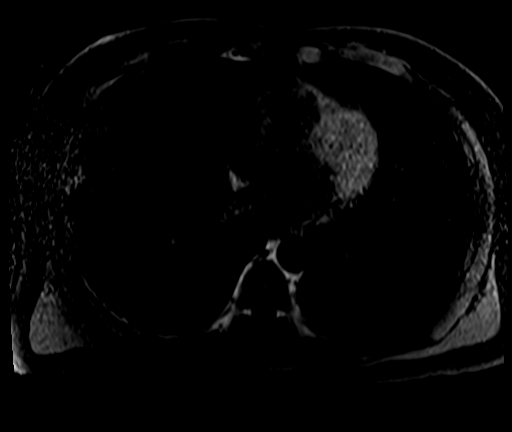

[Series 10: T1 dynamic post-contrast · axial · 2.5mm · 0.74mm/px · z∈[-117,+140]mm · 4 of 104 slices shown (1 of 3)]
[im 1/104]
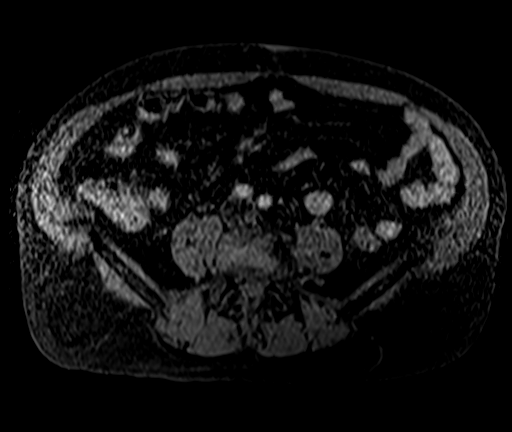
[im 35/104]
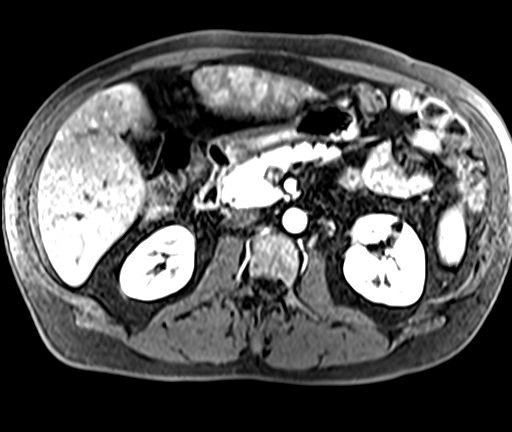
[im 69/104]
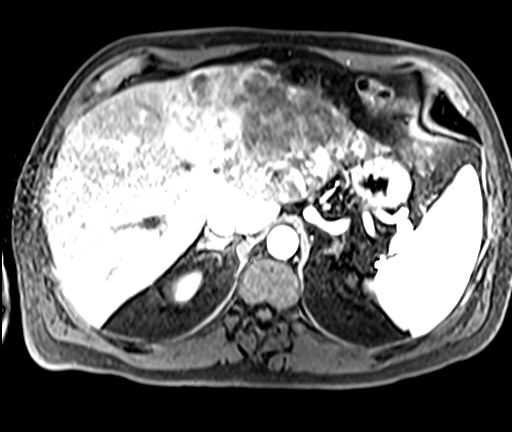
[im 104/104]
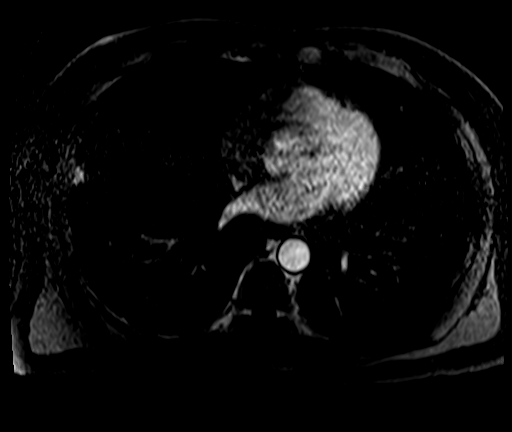

[Series 11: T1 dynamic post-contrast · axial · 2.5mm · 0.74mm/px · z∈[-117,+140]mm · 4 of 104 slices shown (2 of 3)]
[im 1/104]
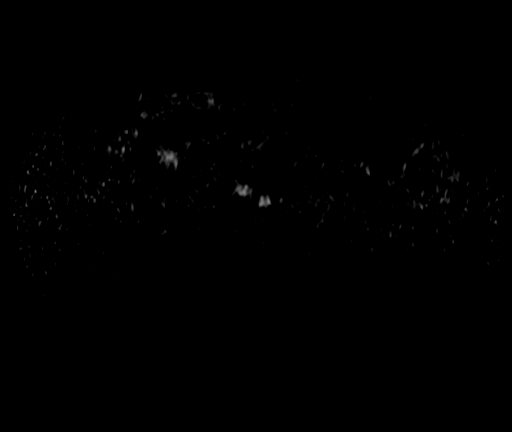
[im 35/104]
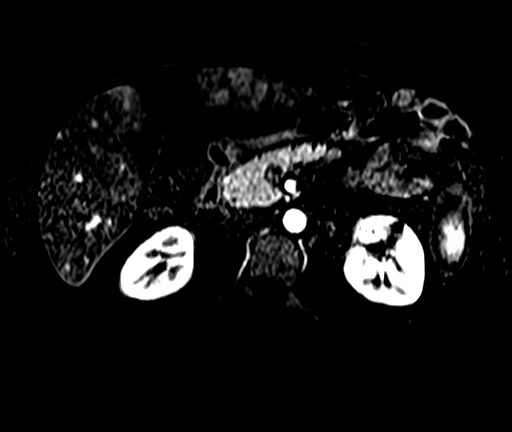
[im 69/104]
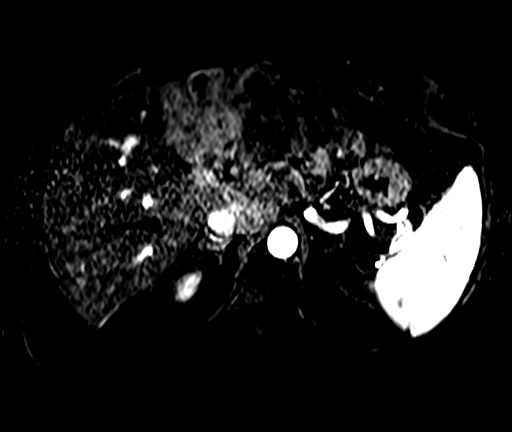
[im 104/104]
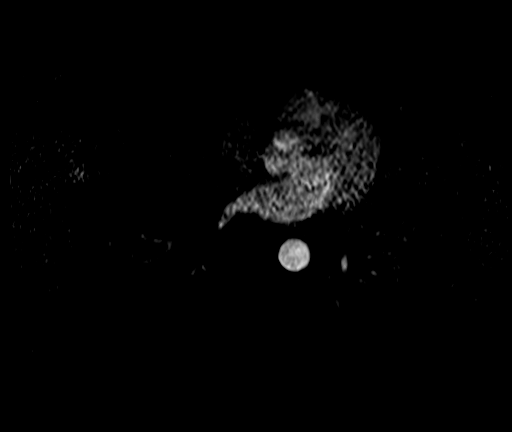

[Series 12: T1 dynamic post-contrast · axial · 2.5mm · 0.74mm/px · z∈[-117,+140]mm · 4 of 104 slices shown (3 of 3)]
[im 1/104]
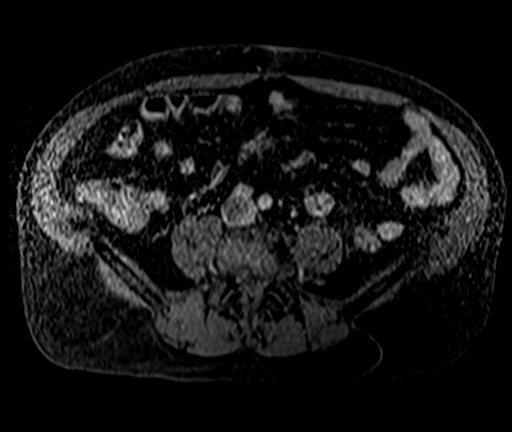
[im 35/104]
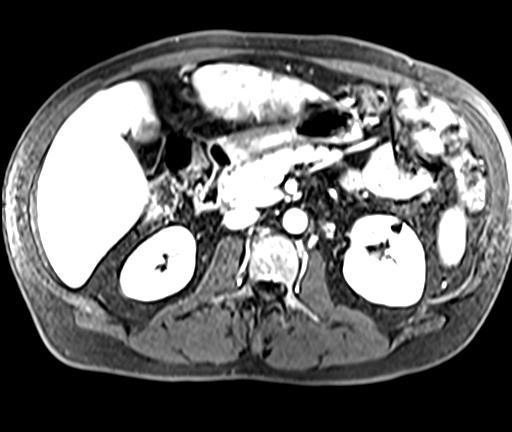
[im 69/104]
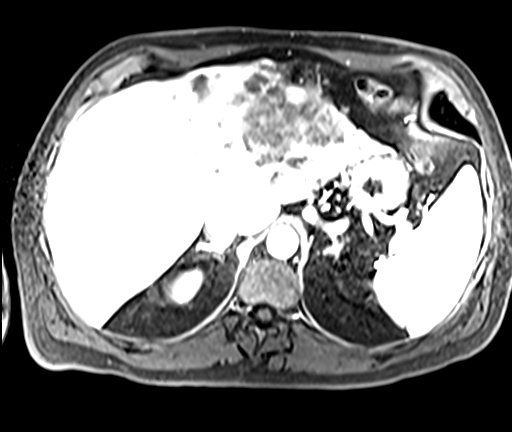
[im 104/104]
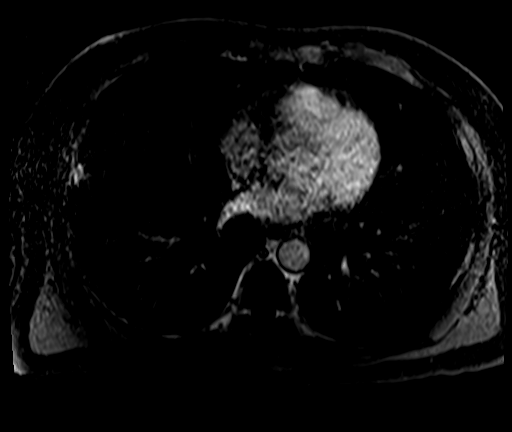

[25 of 48 positions shown; findings below may reference images not displayed]

FINDINGS: Lower chest:  Lung bases are clear.

Hepatobiliary: Large mass expanding the LEFT hepatic lobe and
occupying the entirety of segment 2 and segment 3 as well as
involving segment 4A and 4B. Mass measures approximately 15.8 by
10.5 by 10.6 cm (image 16/series 3). The mass has restricted
diffusion (series 8). On postcontrast imaging the mass is complex
with peripheral enhancement and a central lobulated arboreal pattern
(image 50/series 14). The mass has clear extension into the LEFT
portal vein to the level of the confluence of the LEFT and RIGHT
portal vein (image 45/10).

Round enhancing lesion in segment 5 of the liver ( image 57/series
12) is concerning for a focus of malignancy in the RIGHT hepatic
lobe. A benign cyst is present in the posterior RIGHT hepatic lobe
(image 49/12).

The gallbladder is normal.  The common bile duct normal.

Pancreas: Normal pancreatic parenchymal intensity. No ductal
dilatation or inflammation.

Spleen: Normal spleen.

Adrenals/urinary tract: Adrenal glands and kidneys are normal.

Stomach/Bowel: Stomach and limited of the small bowel is
unremarkable

Vascular/Lymphatic: Abdominal aortic normal caliber. No
retroperitoneal periportal lymphadenopathy.

Musculoskeletal: Rounded lesion in the T12 vertebral body measuring
16 mm on coronal image 6/series 2 is favored benign hemangioma
IMPRESSION: 1. Large complex mass occupying the near entirety of the LEFT
hepatic lobe with extension into the LEFT portal vein to the
confluence. Findings are most consistent with a primary hepatic
neoplasm favoring hepatocellular carcinoma over cholangiocarcinoma.
Recommend correlation with alpha fetoprotein and consider tissue
sampling.
2. Invasion of the LEFT portal vein to the confluence of the LEFT
and RIGHT portal vein.
3. Solitary lesion in the RIGHT hepatic lobe concerning for bilobar
disease.
4. Hemangioma of the T12 vertebral body.

These results will be called to the ordering clinician or
representative by the Radiologist Assistant, and communication
documented in the PACS or [REDACTED].

## 2023-07-04 NOTE — Telephone Encounter (Signed)
Telephone call
